# Patient Record
Sex: Male | Born: 1985 | Race: Black or African American | Hispanic: No | Marital: Single | Smoking: Former smoker
Health system: Southern US, Community
[De-identification: ages and names within clinical notes are randomized; demographics above are authoritative.]

## PROBLEM LIST (undated history)

## (undated) ENCOUNTER — Emergency Department (HOSPITAL_COMMUNITY): Admission: EM | Payer: No Typology Code available for payment source | Source: Home / Self Care

## (undated) ENCOUNTER — Ambulatory Visit (HOSPITAL_COMMUNITY): Admission: EM

## (undated) ENCOUNTER — Ambulatory Visit (HOSPITAL_COMMUNITY)

## (undated) ENCOUNTER — Emergency Department (HOSPITAL_COMMUNITY): Admission: EM | Payer: Self-pay | Source: Home / Self Care

## (undated) DIAGNOSIS — K2970 Gastritis, unspecified, without bleeding: Secondary | ICD-10-CM

## (undated) DIAGNOSIS — M549 Dorsalgia, unspecified: Secondary | ICD-10-CM

---
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
## 2007-11-17 ENCOUNTER — Emergency Department (HOSPITAL_COMMUNITY): Admission: EM | Admit: 2007-11-17 | Discharge: 2007-11-17 | Payer: Self-pay | Admitting: Family Medicine

## 2008-06-24 ENCOUNTER — Emergency Department (HOSPITAL_COMMUNITY): Admission: EM | Admit: 2008-06-24 | Discharge: 2008-06-24 | Payer: Self-pay | Admitting: Family Medicine

## 2008-08-28 ENCOUNTER — Emergency Department (HOSPITAL_COMMUNITY): Admission: EM | Admit: 2008-08-28 | Discharge: 2008-08-28 | Payer: Self-pay | Admitting: Family Medicine

## 2008-09-02 ENCOUNTER — Emergency Department (HOSPITAL_COMMUNITY): Admission: EM | Admit: 2008-09-02 | Discharge: 2008-09-02 | Payer: Self-pay | Admitting: Family Medicine

## 2008-09-11 ENCOUNTER — Emergency Department (HOSPITAL_COMMUNITY): Admission: EM | Admit: 2008-09-11 | Discharge: 2008-09-11 | Payer: Self-pay | Admitting: Family Medicine

## 2008-09-23 ENCOUNTER — Emergency Department (HOSPITAL_COMMUNITY): Admission: EM | Admit: 2008-09-23 | Discharge: 2008-09-23 | Payer: Self-pay | Admitting: Family Medicine

## 2009-09-08 ENCOUNTER — Emergency Department (HOSPITAL_COMMUNITY): Admission: EM | Admit: 2009-09-08 | Discharge: 2009-09-08 | Payer: Self-pay | Admitting: Family Medicine

## 2010-01-07 ENCOUNTER — Emergency Department (HOSPITAL_COMMUNITY)
Admission: EM | Admit: 2010-01-07 | Discharge: 2010-01-07 | Payer: Self-pay | Source: Home / Self Care | Admitting: Family Medicine

## 2010-01-16 ENCOUNTER — Emergency Department (HOSPITAL_COMMUNITY)
Admission: EM | Admit: 2010-01-16 | Discharge: 2010-01-16 | Payer: Self-pay | Source: Home / Self Care | Admitting: Emergency Medicine

## 2010-01-20 ENCOUNTER — Emergency Department (HOSPITAL_COMMUNITY): Admission: EM | Admit: 2010-01-20 | Discharge: 2010-01-20 | Payer: Self-pay | Admitting: Emergency Medicine

## 2010-01-22 ENCOUNTER — Emergency Department (HOSPITAL_COMMUNITY): Admission: EM | Admit: 2010-01-22 | Discharge: 2010-01-22 | Payer: Self-pay | Admitting: Emergency Medicine

## 2010-02-10 ENCOUNTER — Emergency Department (HOSPITAL_COMMUNITY): Admission: EM | Admit: 2010-02-10 | Discharge: 2010-02-10 | Payer: Self-pay | Admitting: Emergency Medicine

## 2010-05-30 LAB — WOUND CULTURE: Culture: NO GROWTH

## 2010-05-31 LAB — GC/CHLAMYDIA PROBE AMP, GENITAL: GC Probe Amp, Genital: NEGATIVE

## 2010-06-26 LAB — DIFFERENTIAL
Basophils Absolute: 0 10*3/uL (ref 0.0–0.1)
Basophils Relative: 0 % (ref 0–1)
Eosinophils Absolute: 0.2 10*3/uL (ref 0.0–0.7)
Monocytes Relative: 8 % (ref 3–12)
Neutro Abs: 3.5 10*3/uL (ref 1.7–7.7)
Neutrophils Relative %: 57 % (ref 43–77)

## 2010-06-26 LAB — CBC
MCHC: 33.8 g/dL (ref 30.0–36.0)
Platelets: 226 10*3/uL (ref 150–400)
RBC: 4.73 MIL/uL (ref 4.22–5.81)
RDW: 13.7 % (ref 11.5–15.5)

## 2010-06-26 LAB — POCT I-STAT, CHEM 8
Calcium, Ion: 1.17 mmol/L (ref 1.12–1.32)
Creatinine, Ser: 1 mg/dL (ref 0.4–1.5)
Hemoglobin: 16 g/dL (ref 13.0–17.0)
Sodium: 138 mEq/L (ref 135–145)
TCO2: 25 mmol/L (ref 0–100)

## 2010-06-28 LAB — POCT RAPID STREP A (OFFICE): Streptococcus, Group A Screen (Direct): NEGATIVE

## 2010-08-01 ENCOUNTER — Inpatient Hospital Stay (INDEPENDENT_AMBULATORY_CARE_PROVIDER_SITE_OTHER)
Admission: RE | Admit: 2010-08-01 | Discharge: 2010-08-01 | Disposition: A | Discharge status: Home / Self Care | Payer: 59 | Source: Ambulatory Visit | Attending: Family Medicine | Admitting: Family Medicine

## 2010-08-01 ENCOUNTER — Ambulatory Visit (INDEPENDENT_AMBULATORY_CARE_PROVIDER_SITE_OTHER): Payer: 59

## 2010-08-01 DIAGNOSIS — S139XXA Sprain of joints and ligaments of unspecified parts of neck, initial encounter: Secondary | ICD-10-CM

## 2010-08-01 DIAGNOSIS — M546 Pain in thoracic spine: Secondary | ICD-10-CM

## 2011-10-12 ENCOUNTER — Encounter (HOSPITAL_COMMUNITY): Payer: Self-pay | Admitting: Emergency Medicine

## 2011-10-12 ENCOUNTER — Emergency Department (INDEPENDENT_AMBULATORY_CARE_PROVIDER_SITE_OTHER)
Admission: EM | Admit: 2011-10-12 | Discharge: 2011-10-12 | Disposition: A | Discharge status: Home / Self Care | Payer: Self-pay | Source: Home / Self Care | Attending: Family Medicine | Admitting: Family Medicine

## 2011-10-12 DIAGNOSIS — K2990 Gastroduodenitis, unspecified, without bleeding: Secondary | ICD-10-CM

## 2011-10-12 DIAGNOSIS — K2970 Gastritis, unspecified, without bleeding: Secondary | ICD-10-CM

## 2011-10-12 MED ORDER — ONDANSETRON 4 MG PO TBDP
ORAL_TABLET | ORAL | Status: AC
  Filled 2011-10-12: qty 2

## 2011-10-12 MED ORDER — ONDANSETRON 4 MG PO TBDP
8.0000 mg | ORAL_TABLET | Freq: Once | ORAL | Status: AC
  Administered 2011-10-12: 8 mg via ORAL

## 2011-10-12 MED ORDER — GI COCKTAIL ~~LOC~~
30.0000 mL | Freq: Once | ORAL | Status: AC
  Administered 2011-10-12: 30 mL via ORAL

## 2011-10-12 MED ORDER — PROMETHAZINE HCL 12.5 MG PO TABS: 12.5000 mg | ORAL_TABLET | Freq: Four times a day (QID) | ORAL | Status: DC | PRN

## 2011-10-12 MED ORDER — OMEPRAZOLE 20 MG PO CPDR: DELAYED_RELEASE_CAPSULE | ORAL | Status: DC

## 2011-10-12 MED ORDER — SUCRALFATE 1 GM/10ML PO SUSP: 1.0000 g | Freq: Four times a day (QID) | ORAL | Status: DC

## 2011-10-12 MED ORDER — GI COCKTAIL ~~LOC~~
ORAL | Status: AC
  Filled 2011-10-12: qty 30

## 2011-10-12 NOTE — ED Provider Notes (Signed)
History     CSN: [id number]  Arrival date & time 10/12/11  1101   First MD Initiated Contact with Patient 10/12/11 1104      Chief Complaint  Patient presents with  . GI Problem    (Consider location/radiation/quality/duration/timing/severity/associated sxs/prior treatment) HPI Comments: 26 year old smoker male otherwise healthy. Here complaining of abdominal cramping, nausea and vomiting 4 times overnight. Last emesis over 3 hours ago non bilious, no hematemesis. States that it was his birthday yesterday and he was partying and drinking good amounts of alcohol (beer and vodka). Also reports eating [place] food late night. Last bowel movement yesterday evening normal no melena. Denies diarrhea. Has been able to drink fluids this morning but still nauseous. Pain is worse in the epigastric area and it is constant. Denies hematemesis.    History reviewed. No pertinent past medical history.  History reviewed. No pertinent past surgical history.  No family history on file.  History  Substance Use Topics  . Smoking status: Current Everyday Smoker  . Smokeless tobacco: Not on file  . Alcohol Use: Yes      Review of Systems  Constitutional: Negative for fever and chills.       10 systems reviewed and  pertinent negative and positive symptoms are as per HPI.     Respiratory: Negative for cough and shortness of breath.   Cardiovascular: Negative for chest pain.  Gastrointestinal: Positive for nausea, vomiting and abdominal pain. Negative for diarrhea, blood in stool and abdominal distention.  Genitourinary: Negative for dysuria.  Skin: Negative for rash.  Neurological: Negative for headaches.  All other systems reviewed and are negative.    Allergies  Review of patient's allergies indicates no known allergies.  Home Medications   Current Outpatient Rx  Name Route Sig Dispense Refill  . OMEPRAZOLE 20 MG PO CPDR  Take 1 tab by mouth twice a day for 1 week then 1 tab daily as  needed. 30 capsule 0  . PROMETHAZINE HCL 12.5 MG PO TABS Oral Take 1 tablet (12.5 mg total) by mouth every 6 (six) hours as needed for nausea. 15 tablet 0  . SUCRALFATE 1 GM/10ML PO SUSP Oral Take 10 mLs (1 g total) by mouth 4 (four) times daily. Take 30 minutes prior meals and before bedtime for one week. 420 mL 0    BP 112/67  Pulse 74  Temp 98.1 F (36.7 C) (Oral)  Resp 16  SpO2 98%  Physical Exam  Nursing note and vitals reviewed. Constitutional: He is oriented to person, place, and time. He appears well-developed and well-nourished. No distress.  HENT:  Head: Normocephalic and atraumatic.  Mouth/Throat: Oropharynx is clear and moist. No oropharyngeal exudate.  Eyes: Conjunctivae and EOM are normal. Pupils are equal, round, and reactive to light. No scleral icterus.  Neck: Normal range of motion. Neck supple. No JVD present. No thyromegaly present.  Cardiovascular: Normal rate, regular rhythm and normal heart sounds.   Pulmonary/Chest: Effort normal and breath sounds normal. No respiratory distress.  Abdominal: Soft. Bowel sounds are normal. He exhibits no distension and no mass. There is tenderness. There is no rebound and no guarding.       epigastric tenderness. No peritoneal reaction.  Lymphadenopathy:    He has no cervical adenopathy.  Neurological: He is alert and oriented to person, place, and time.  Skin: No rash noted.    ED Course  Procedures (including critical care time)  Labs Reviewed - No data to display No results found.  1. Gastritis       MDM  No clinical findings on abdominal examination suggestive of surgical abdomen. Impress symptoms related to gastritis. Treated with GI cocktail and Zofran here. Oral fluid challenge. Patient reported significantly improved symptoms prior discharge.         Sharin Grave, MD 10/13/11 [phone]

## 2011-10-12 NOTE — ED Notes (Signed)
PT TOLERATED GINGER ALE WITH N/V

## 2011-10-12 NOTE — ED Notes (Signed)
Here with x 4 episodes of vomiting and abdominal cramping since yesterday after drinking and eating late night chinese food.denies diarrhea.able to keep food/fluids down.vss

## 2011-10-23 ENCOUNTER — Emergency Department (INDEPENDENT_AMBULATORY_CARE_PROVIDER_SITE_OTHER)
Admission: EM | Admit: 2011-10-23 | Discharge: 2011-10-23 | Disposition: A | Discharge status: Home / Self Care | Payer: Self-pay | Source: Home / Self Care | Attending: Emergency Medicine | Admitting: Emergency Medicine

## 2011-10-23 ENCOUNTER — Encounter (HOSPITAL_COMMUNITY): Payer: Self-pay | Admitting: Emergency Medicine

## 2011-10-23 DIAGNOSIS — S301XXA Contusion of abdominal wall, initial encounter: Secondary | ICD-10-CM

## 2011-10-23 DIAGNOSIS — S335XXA Sprain of ligaments of lumbar spine, initial encounter: Secondary | ICD-10-CM

## 2011-10-23 DIAGNOSIS — S39012A Strain of muscle, fascia and tendon of lower back, initial encounter: Secondary | ICD-10-CM

## 2011-10-23 HISTORY — DX: Gastritis, unspecified, without bleeding: K29.70

## 2011-10-23 MED ORDER — CYCLOBENZAPRINE HCL 10 MG PO TABS: 10.0000 mg | ORAL_TABLET | Freq: Three times a day (TID) | ORAL | Status: AC | PRN

## 2011-10-23 MED ORDER — MELOXICAM 15 MG PO TABS: 15.0000 mg | ORAL_TABLET | Freq: Every day | ORAL | Status: AC

## 2011-10-23 MED ORDER — HYDROCODONE-ACETAMINOPHEN 5-325 MG PO TABS: 2.0000 | ORAL_TABLET | ORAL | Status: AC | PRN
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
## 2011-10-24 ENCOUNTER — Emergency Department (HOSPITAL_COMMUNITY)
Admission: EM | Admit: 2011-10-24 | Discharge: 2011-10-24 | Disposition: A | Discharge status: Home / Self Care | Payer: Worker's Compensation | Attending: Emergency Medicine | Admitting: Emergency Medicine

## 2011-10-24 ENCOUNTER — Emergency Department (HOSPITAL_COMMUNITY): Payer: Worker's Compensation

## 2011-10-24 ENCOUNTER — Encounter (HOSPITAL_COMMUNITY): Payer: Self-pay | Admitting: Emergency Medicine

## 2011-10-24 DIAGNOSIS — M542 Cervicalgia: Secondary | ICD-10-CM

## 2011-10-24 DIAGNOSIS — M545 Low back pain, unspecified: Secondary | ICD-10-CM

## 2011-10-24 DIAGNOSIS — R109 Unspecified abdominal pain: Secondary | ICD-10-CM | POA: Insufficient documentation

## 2011-10-24 MED ORDER — CYCLOBENZAPRINE HCL 10 MG PO TABS: 10.0000 mg | ORAL_TABLET | Freq: Two times a day (BID) | ORAL | Status: AC | PRN

## 2011-10-24 MED ORDER — HYDROCODONE-ACETAMINOPHEN 5-325 MG PO TABS: 2.0000 | ORAL_TABLET | ORAL | Status: AC | PRN

## 2011-10-24 MED ORDER — IBUPROFEN 600 MG PO TABS: 600.0000 mg | ORAL_TABLET | Freq: Four times a day (QID) | ORAL | Status: AC | PRN
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
## 2013-02-13 ENCOUNTER — Emergency Department (INDEPENDENT_AMBULATORY_CARE_PROVIDER_SITE_OTHER)
Admission: EM | Admit: 2013-02-13 | Discharge: 2013-02-13 | Disposition: A | Discharge status: Home / Self Care | Payer: PRIVATE HEALTH INSURANCE | Source: Home / Self Care | Attending: Emergency Medicine | Admitting: Emergency Medicine

## 2013-02-13 ENCOUNTER — Encounter (HOSPITAL_COMMUNITY): Payer: Self-pay | Admitting: Emergency Medicine

## 2013-02-13 DIAGNOSIS — K5289 Other specified noninfective gastroenteritis and colitis: Secondary | ICD-10-CM

## 2013-02-13 DIAGNOSIS — K529 Noninfective gastroenteritis and colitis, unspecified: Secondary | ICD-10-CM

## 2013-02-13 LAB — POCT URINALYSIS DIP (DEVICE)
Bilirubin Urine: NEGATIVE
Glucose, UA: NEGATIVE mg/dL
Hgb urine dipstick: NEGATIVE
Ketones, ur: NEGATIVE mg/dL
Leukocytes, UA: NEGATIVE
Nitrite: NEGATIVE
pH: 8.5 — ABNORMAL HIGH (ref 5.0–8.0)

## 2013-02-13 LAB — CBC WITH DIFFERENTIAL/PLATELET
Basophils Absolute: 0 10*3/uL (ref 0.0–0.1)
Lymphocytes Relative: 35 % (ref 12–46)
Lymphs Abs: 2.5 10*3/uL (ref 0.7–4.0)
Neutro Abs: 4 10*3/uL (ref 1.7–7.7)
Neutrophils Relative %: 56 % (ref 43–77)
Platelets: 254 10*3/uL (ref 150–400)
RBC: 4.76 MIL/uL (ref 4.22–5.81)
RDW: 14.6 % (ref 11.5–15.5)
WBC: 7.2 10*3/uL (ref 4.0–10.5)

## 2013-02-13 LAB — POCT I-STAT, CHEM 8
BUN: 12 mg/dL (ref 6–23)
Creatinine, Ser: 1.2 mg/dL (ref 0.50–1.35)
Potassium: 3.5 mEq/L (ref 3.5–5.1)
Sodium: 141 mEq/L (ref 135–145)
TCO2: 24 mmol/L (ref 0–100)

## 2013-02-13 MED ORDER — GI COCKTAIL ~~LOC~~
30.0000 mL | Freq: Once | ORAL | Status: AC
  Administered 2013-02-13: 30 mL via ORAL

## 2013-02-13 MED ORDER — GI COCKTAIL ~~LOC~~
ORAL | Status: AC
  Filled 2013-02-13: qty 30

## 2013-02-13 MED ORDER — ONDANSETRON 4 MG PO TBDP
8.0000 mg | ORAL_TABLET | Freq: Once | ORAL | Status: AC
  Administered 2013-02-13: 8 mg via ORAL

## 2013-02-13 MED ORDER — ONDANSETRON HCL 8 MG PO TABS: 8.0000 mg | ORAL_TABLET | Freq: Three times a day (TID) | ORAL | Status: DC | PRN

## 2013-02-13 MED ORDER — DIPHENOXYLATE-ATROPINE 2.5-0.025 MG PO TABS: 1.0000 | ORAL_TABLET | Freq: Four times a day (QID) | ORAL | Status: DC | PRN

## 2013-02-13 MED ORDER — ONDANSETRON 4 MG PO TBDP
ORAL_TABLET | ORAL | Status: AC
  Filled 2013-02-13: qty 2

## 2013-02-13 MED ORDER — DICYCLOMINE HCL 20 MG PO TABS: ORAL_TABLET | ORAL | Status: DC

## 2013-02-13 NOTE — ED Provider Notes (Signed)
Chief Complaint:   Chief Complaint  Patient presents with  . Abdominal Pain    History of Present Illness:   Manuel Silva is a 27 year old male who around 7 PM yesterday after a big Thanksgiving meal, though mid abdominal pain which would come and go and was rated 5-6/10 in intensity. It's persisted through until today. Nothing makes it better or worse. It does not radiate. He's felt nauseated and vomited 3 times yesterday and once today. No hematemesis, bilious emesis, coffee-ground emesis. He's had loose stools one today and 2 yesterday. No blood in the stool or melena. He denies fever, chills, muscle aches, or URI symptoms. He has had no sick exposures or foreign travel. He blames this on eating his grandmother's [place] which he thinks was not fully cooked.  Review of Systems:  Other than noted above, the patient denies any of the following symptoms: Systemic:  No fevers, chills, sweats, weight loss or gain, fatigue, or tiredness. ENT:  No nasal congestion, rhinorrhea, or sore throat. Lungs:  No cough, wheezing, or shortness of breath. Cardiac:  No chest pain, syncope, or presyncope. GI:  No abdominal pain, nausea, vomiting, anorexia, diarrhea, constipation, blood in stool or vomitus. GU:  No dysuria, frequency, or urgency.  PMFSH:  Past medical history, family history, social history, meds, and allergies were reviewed.  He is allergic to penicillins.  Physical Exam:   Vital signs:  BP 136/78  Pulse 50  Temp(Src) 97.3 F (36.3 C) (Oral)  Resp 16  SpO2 100% General:  Alert and oriented.  In no distress.  Skin warm and dry.  Good skin turgor, brisk capillary refill. ENT:  No scleral icterus, moist mucous membranes, no oral lesions, pharynx clear. Lungs:  Breath sounds clear and equal bilaterally.  No wheezes, rales, or rhonchi. Heart:  Rhythm regular, without extrasystoles.  No gallops or murmers. Abdomen:  Soft, flat, nondistended. No organomegaly or mass. Bowel sounds rule  out and hyperactive. There is no tenderness, guarding, or rebound. Skin: Clear, warm, and dry.  Good turgor.  Brisk capillary refill.  Labs:   Results for orders placed during the hospital encounter of 02/13/13  CBC WITH DIFFERENTIAL      Result Value Range   WBC 7.2  4.0 - 10.5 K/uL   RBC 4.76  4.22 - 5.81 MIL/uL   Hemoglobin 14.8  13.0 - 17.0 g/dL   HCT 16.1  09.6 - 04.5 %   MCV 88.0  78.0 - 100.0 fL   MCH 31.1  26.0 - 34.0 pg   MCHC 35.3  30.0 - 36.0 g/dL   RDW 40.9  81.1 - 91.4 %   Platelets 254  150 - 400 K/uL   Neutrophils Relative % 56  43 - 77 %   Neutro Abs 4.0  1.7 - 7.7 K/uL   Lymphocytes Relative 35  12 - 46 %   Lymphs Abs 2.5  0.7 - 4.0 K/uL   Monocytes Relative 7  3 - 12 %   Monocytes Absolute 0.5  0.1 - 1.0 K/uL   Eosinophils Relative 2  0 - 5 %   Eosinophils Absolute 0.2  0.0 - 0.7 K/uL   Basophils Relative 0  0 - 1 %   Basophils Absolute 0.0  0.0 - 0.1 K/uL  POCT I-STAT, CHEM 8      Result Value Range   Sodium 141  135 - 145 mEq/L   Potassium 3.5  3.5 - 5.1 mEq/L   Chloride 103  96 -  112 mEq/L   BUN 12  6 - 23 mg/dL   Creatinine, Ser 1.61  0.50 - 1.35 mg/dL   Glucose, Bld 096 (*) 70 - 99 mg/dL   Calcium, Ion 0.45 (*) 1.12 - 1.23 mmol/L   TCO2 24  0 - 100 mmol/L   Hemoglobin 16.0  13.0 - 17.0 g/dL   HCT 40.9  81.1 - 91.4 %  POCT URINALYSIS DIP (DEVICE)      Result Value Range   Glucose, UA NEGATIVE  NEGATIVE mg/dL   Bilirubin Urine NEGATIVE  NEGATIVE   Ketones, ur NEGATIVE  NEGATIVE mg/dL   Specific Gravity, Urine 1.020  1.005 - 1.030   Hgb urine dipstick NEGATIVE  NEGATIVE   pH 8.5 (*) 5.0 - 8.0   Protein, ur NEGATIVE  NEGATIVE mg/dL   Urobilinogen, UA 0.2  0.0 - 1.0 mg/dL   Nitrite NEGATIVE  NEGATIVE   Leukocytes, UA NEGATIVE  NEGATIVE     Course in Urgent Care Center:   He was given 30 cc of GI cocktail and Zofran ODT 8 mg sublingually with good relief of his symptoms.  Assessment:  The encounter diagnosis was Gastroenteritis.  Plan:   1.   Meds:  The following meds were prescribed:   New Prescriptions   DICYCLOMINE (BENTYL) 20 MG TABLET    1 every 8 hours as needed for abdominal pain.   DIPHENOXYLATE-ATROPINE (LOMOTIL) 2.5-0.025 MG PER TABLET    Take 1 tablet by mouth 4 (four) times daily as needed for diarrhea or loose stools.   ONDANSETRON (ZOFRAN) 8 MG TABLET    Take 1 tablet (8 mg total) by mouth every 8 (eight) hours as needed for nausea or vomiting.    2.  Patient Education/Counseling:  The patient was given appropriate handouts, self care instructions, and instructed in symptomatic relief. The patient was told to stay on clear liquids for the remainder of the day, then advance to a B.R.A.T. diet starting tomorrow.   3.  Follow up:  The patient was told to follow up if no better in 3 to 4 days, if becoming worse in any way, and given some red flag symptoms such as persistent vomiting, fever, worsening abdominal pain, or any signs of GI bleeding which would prompt immediate return.  Follow up here if necessary.       Reuben Likes, MD 02/13/13 1021

## 2013-02-13 NOTE — ED Notes (Signed)
C/o pain umbilical area since yesterday w n/v/d; pain comes and goes, some worse w ride over here from home, able to walk upright w/o difficulty; NAD

## 2013-04-22 ENCOUNTER — Encounter (HOSPITAL_COMMUNITY): Payer: Self-pay | Admitting: Emergency Medicine

## 2013-04-22 ENCOUNTER — Emergency Department (INDEPENDENT_AMBULATORY_CARE_PROVIDER_SITE_OTHER)
Admission: EM | Admit: 2013-04-22 | Discharge: 2013-04-22 | Disposition: A | Discharge status: Home / Self Care | Payer: Worker's Compensation | Source: Home / Self Care | Attending: Family Medicine | Admitting: Family Medicine

## 2013-04-22 ENCOUNTER — Emergency Department (INDEPENDENT_AMBULATORY_CARE_PROVIDER_SITE_OTHER): Payer: Worker's Compensation

## 2013-04-22 DIAGNOSIS — S93409A Sprain of unspecified ligament of unspecified ankle, initial encounter: Secondary | ICD-10-CM

## 2013-04-22 DIAGNOSIS — S93402A Sprain of unspecified ligament of left ankle, initial encounter: Secondary | ICD-10-CM

## 2013-04-22 NOTE — ED Provider Notes (Signed)
CSN: [id number]     Arrival date & time 04/22/13  1858 History   First MD Initiated Contact with Patient 04/22/13 1925     Chief Complaint  Patient presents with  . Ankle Injury   (Consider location/radiation/quality/duration/timing/severity/associated sxs/prior Treatment) Patient is a 28 y.o. male presenting with ankle pain. The history is provided by the patient.  Ankle Pain Location:  Ankle Time since incident:  3 hours Injury: yes   Mechanism of injury: fall   Fall:    Fall occurred: twisted falling over hand cart at work.   Entrapped after fall: no   Ankle location:  L ankle Pain details:    Quality:  Throbbing   Radiates to:  Does not radiate Chronicity:  New Dislocation: no   Prior injury to area:  No Associated symptoms: decreased ROM   Associated symptoms: no back pain and no numbness     Past Medical History  Diagnosis Date  . Gastritis    History reviewed. No pertinent past surgical history. No family history on file. History  Substance Use Topics  . Smoking status: Current Some Day Smoker    Types: Cigarettes  . Smokeless tobacco: Not on file  . Alcohol Use: Yes     Comment: occasion    Review of Systems  Constitutional: Negative.   Musculoskeletal: Positive for gait problem and joint swelling. Negative for back pain.  Skin: Negative.     Allergies  Penicillins  Home Medications   Current Outpatient Rx  Name  Route  Sig  Dispense  Refill  . dicyclomine (BENTYL) 20 MG tablet      1 every 8 hours as needed for abdominal pain.   20 tablet   0   . diphenoxylate-atropine (LOMOTIL) 2.5-0.025 MG per tablet   Oral   Take 1 tablet by mouth 4 (four) times daily as needed for diarrhea or loose stools.   16 tablet   0   . ondansetron (ZOFRAN) 8 MG tablet   Oral   Take 1 tablet (8 mg total) by mouth every 8 (eight) hours as needed for nausea or vomiting.   20 tablet   0    BP 131/73  Pulse 56  Temp(Src) 97.1 F (36.2 C) (Oral)  Resp 17   SpO2 98% Physical Exam  Nursing note and vitals reviewed. Constitutional: He is oriented to person, place, and time. He appears well-developed and well-nourished.  Musculoskeletal: He exhibits tenderness.       Left ankle: He exhibits decreased range of motion and swelling. He exhibits no deformity. Tenderness. Lateral malleolus and AITFL tenderness found. No medial malleolus, no head of 5th metatarsal and no proximal fibula tenderness found. Achilles tendon normal.  Neurological: He is alert and oriented to person, place, and time.  Skin: Skin is warm and dry.    ED Course  Procedures (including critical care time) Labs Review Labs Reviewed - No data to display Imaging Review Dg Ankle Complete Left  04/22/2013   CLINICAL DATA:  Pain post trauma  EXAM: LEFT ANKLE COMPLETE - 3+ VIEW  COMPARISON:  None.  FINDINGS: Frontal, oblique, and lateral views were obtained. 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 No acute fracture is seen in this region. Elsewhere, there is no evidence of acute fracture or joint effusion. Ankle mortise appears intact.  IMPRESSION: Old fracture of the medial malleolus with displaced fragment. No acute fracture apparent. Mortise intact.   Electronically Signed   By: Chrissie NoaWilliam  Margarita Grizzle M.D.   On: 04/22/2013 19:53      MDM  X-rays reviewed and report per radiologist.     Linna Hoff, MD 04/22/13 2001

## 2013-04-22 NOTE — Discharge Instructions (Signed)
Wear ankle support as needed for comfort, activity as tolerated. advil and ice as needed, return or see orthopedist if further problems. °

## 2013-04-22 NOTE — ED Notes (Addendum)
Pt reports left ankle inj onset 1200 today while at work Reports he twisted ankle outward and foot inward Reports he felt/heard a "pop" sxs include: swelling, pain Brought back in a wheel chair Alert w/no signs of acute distress.

## 2013-09-24 ENCOUNTER — Emergency Department (INDEPENDENT_AMBULATORY_CARE_PROVIDER_SITE_OTHER)
Admission: EM | Admit: 2013-09-24 | Discharge: 2013-09-24 | Disposition: A | Discharge status: Home / Self Care | Payer: PRIVATE HEALTH INSURANCE | Source: Home / Self Care | Attending: Emergency Medicine | Admitting: Emergency Medicine

## 2013-09-24 ENCOUNTER — Encounter (HOSPITAL_COMMUNITY): Payer: Self-pay | Admitting: Emergency Medicine

## 2013-09-24 ENCOUNTER — Emergency Department (HOSPITAL_COMMUNITY): Payer: PRIVATE HEALTH INSURANCE

## 2013-09-24 DIAGNOSIS — R0602 Shortness of breath: Secondary | ICD-10-CM

## 2013-09-24 NOTE — ED Provider Notes (Signed)
CSN: [id number]     Arrival date & time 09/24/13  0906 History   First MD Initiated Contact with Patient 09/24/13 1002     Chief Complaint  Patient presents with  . Chest Pain   (Consider location/radiation/quality/duration/timing/severity/associated sxs/prior Treatment) HPI Comments: 28 year old male presents for evaluation of chest pain. Since last night he has had substernal chest pain. This comes and goes. It is usually worse with a deep breath. It is also sometimes worse with exertion. He also notes that he recently started smoking. The pain does not radiate. He denies any associated nausea, vomiting. No swelling or pain in the legs. No recent travel. No personal or family history of DVT or PE. No family history of heart disease at young age.   Past Medical History  Diagnosis Date  . Gastritis    History reviewed. No pertinent past surgical history. History reviewed. No pertinent family history. History  Substance Use Topics  . Smoking status: Current Some Day Smoker    Types: Cigarettes  . Smokeless tobacco: Not on file  . Alcohol Use: Yes     Comment: occasion    Review of Systems  Respiratory: Positive for shortness of breath.   Cardiovascular: Positive for chest pain.  All other systems reviewed and are negative.   Allergies  Penicillins  Home Medications   Prior to Admission medications   Medication Sig Start Date End Date Taking? Authorizing Provider  dicyclomine (BENTYL) 20 MG tablet 1 every 8 hours as needed for abdominal pain. 02/13/13   Reuben Likesavid C Keller, MD  diphenoxylate-atropine (LOMOTIL) 2.5-0.025 MG per tablet Take 1 tablet by mouth 4 (four) times daily as needed for diarrhea or loose stools. 02/13/13   Reuben Likesavid C Keller, MD  ondansetron (ZOFRAN) 8 MG tablet Take 1 tablet (8 mg total) by mouth every 8 (eight) hours as needed for nausea or vomiting. 02/13/13   Reuben Likesavid C Keller, MD   BP 123/64  Pulse 60  Temp(Src) 98.3 F (36.8 C) (Oral)  Resp 18  SpO2  98% Physical Exam  Nursing note and vitals reviewed. Constitutional: He is oriented to person, place, and time. He appears well-developed and well-nourished. No distress.  HENT:  Head: Normocephalic and atraumatic.  Right Ear: External ear normal.  Eyes: Conjunctivae are normal.  Neck: Normal range of motion. Neck supple. No JVD present.  Cardiovascular: Normal rate, regular rhythm, normal heart sounds and intact distal pulses.  Exam reveals no gallop and no friction rub.   No murmur heard. Pulmonary/Chest: Effort normal and breath sounds normal. No respiratory distress. He has no wheezes. He has no rales.  Neurological: He is alert and oriented to person, place, and time. Coordination normal.  Skin: Skin is warm and dry. No rash noted. He is not diaphoretic.  Psychiatric: He has a normal mood and affect. Judgment normal.    ED Course  Procedures (including critical care time) Labs Review Labs Reviewed - No data to display  Imaging Review No results found.   MDM   1. Shortness of breath    After discussing this further, the patient further explains that he is currently asymptomatic and has been for the past few hours, he just felt very slightly short winded when he woke up. He is now denying that he ever had any chest pain. He'll go to the ED if these symptoms return. Quit smoking. He'll followup as needed    Graylon GoodZachary H Alka Falwell, PA-C 09/24/13 1050

## 2013-09-24 NOTE — ED Notes (Signed)
Pt  Reports  Symptoms  Of    Chest  Pain        Since  Last  Pm   -  Hurts  When  He  Takes  A  Deep  Breath  -  Pt  Is  A  Smoker         He  Is  Awake  And  Alert  And  Oriented        Skin is  Warm and  Dry

## 2013-09-24 NOTE — Discharge Instructions (Signed)

## 2013-09-24 NOTE — ED Provider Notes (Signed)
Medical screening examination/treatment/procedure(s) were performed by non-physician practitioner and as supervising physician I was immediately available for consultation/collaboration.  Orvan Papadakis, M.D.  Cherylann Hobday C Kaleb Linquist, MD 09/24/13 1114 

## 2013-11-17 ENCOUNTER — Emergency Department (HOSPITAL_COMMUNITY)
Admission: EM | Admit: 2013-11-17 | Discharge: 2013-11-17 | Disposition: A | Discharge status: Home / Self Care | Payer: PRIVATE HEALTH INSURANCE | Attending: Emergency Medicine | Admitting: Emergency Medicine

## 2013-11-17 ENCOUNTER — Encounter (HOSPITAL_COMMUNITY): Payer: Self-pay | Admitting: Emergency Medicine

## 2013-11-17 ENCOUNTER — Emergency Department (HOSPITAL_COMMUNITY): Payer: PRIVATE HEALTH INSURANCE

## 2013-11-17 DIAGNOSIS — Z880 Allergy status to penicillin: Secondary | ICD-10-CM | POA: Insufficient documentation

## 2013-11-17 DIAGNOSIS — R51 Headache: Secondary | ICD-10-CM | POA: Insufficient documentation

## 2013-11-17 DIAGNOSIS — J3489 Other specified disorders of nose and nasal sinuses: Secondary | ICD-10-CM | POA: Diagnosis present

## 2013-11-17 DIAGNOSIS — J069 Acute upper respiratory infection, unspecified: Secondary | ICD-10-CM

## 2013-11-17 DIAGNOSIS — IMO0002 Reserved for concepts with insufficient information to code with codable children: Secondary | ICD-10-CM | POA: Diagnosis not present

## 2013-11-17 DIAGNOSIS — F17200 Nicotine dependence, unspecified, uncomplicated: Secondary | ICD-10-CM | POA: Insufficient documentation

## 2013-11-17 DIAGNOSIS — Z8719 Personal history of other diseases of the digestive system: Secondary | ICD-10-CM | POA: Diagnosis not present

## 2013-11-17 MED ORDER — FLUTICASONE PROPIONATE 50 MCG/ACT NA SUSP: 2.0000 | Freq: Every day | NASAL | Status: DC

## 2013-11-17 NOTE — ED Notes (Signed)
Patient states started having sinus pressure and nasal congestion yesterday.  Slight sore throat with productive cough.

## 2013-11-17 NOTE — ED Provider Notes (Signed)
CSN: [id number]     Arrival date & time 11/17/13  0826 History  This chart was scribed for non-physician practitioner, Allen Derry, PA-C working with Mirian Mo, MD by Greggory Stallion, ED scribe. This patient was seen in room TR07C/TR07C and the patient's care was started at 9:16 AM.    Chief Complaint  Patient presents with  . Nasal Congestion  . Cough   Patient is a 28 y.o. male presenting with cough. The history is provided by the patient. No language interpreter was used.  Cough Cough characteristics:  Productive Sputum characteristics:  Yellow Severity:  Mild Duration:  1 day Timing:  Intermittent Progression:  Unchanged Chronicity:  New Smoker: yes   Relieved by:  Nothing Worsened by:  Nothing tried Ineffective treatments: benadryl. Associated symptoms: headaches, rhinorrhea and sore throat   Associated symptoms: no chest pain, no ear pain and no shortness of breath   Rhinorrhea:    Severity:  Moderate   Duration:  1 day   Timing:  Intermittent   Progression:  Unchanged  HPI Comments: CEDERIC MOZLEY is a 28 y.o. male who presents to the Emergency Department complaining of productive cough of yellow sputum, sinus pressure, nasal congestion and rhinorrhea that started yesterday. Reports associated mild sore throat, headache and eye watering. Pt has taken benadryl with no relief. States symptoms are not worsened by anything. Denies ear pain, chest pain, SOB, abdominal pain, nausea, emesis. Denies sick contacts. Denies history of environmental allergies or asthma. Pt smokes about one pack of cigarettes every 3 weeks.   Past Medical History  Diagnosis Date  . Gastritis    History reviewed. No pertinent past surgical history. No family history on file. History  Substance Use Topics  . Smoking status: Current Some Day Smoker    Types: Cigarettes  . Smokeless tobacco: Not on file  . Alcohol Use: Yes     Comment: occasion    Review of Systems  HENT:  Positive for congestion, rhinorrhea, sinus pressure and sore throat. Negative for ear pain.   Respiratory: Positive for cough. Negative for shortness of breath.   Cardiovascular: Negative for chest pain.  Gastrointestinal: Negative for nausea, vomiting and abdominal pain.  Neurological: Positive for headaches.  10 Systems reviewed and are negative for acute change except as noted in the HPI.  Allergies  Penicillins  Home Medications   Prior to Admission medications   Medication Sig Start Date End Date Taking? Authorizing Provider  diphenhydrAMINE (BENADRYL) 25 MG tablet Take 50 mg by mouth every 6 (six) hours as needed for allergies.   Yes Historical Provider, MD  fluticasone (FLONASE) 50 MCG/ACT nasal spray Place 2 sprays into both nostrils daily. 11/17/13   Zach Tietje Strupp Camprubi-Soms, PA-C   BP 137/76  Pulse 77  Temp(Src) 98.1 F (36.7 C) (Oral)  Ht  (1.753 m)  Wt 195 lb (88.451 kg)  BMI 28.78 kg/m2  SpO2 99%  Physical Exam  Nursing note and vitals reviewed. Constitutional: He is oriented to person, place, and time. Vital signs are normal. He appears well-developed and well-nourished. No distress.  Afebrile, nontoxic  HENT:  Head: Normocephalic and atraumatic.  Right Ear: Tympanic membrane, external ear and ear canal normal. Tympanic membrane is not injected. No middle ear effusion.  Left Ear: Tympanic membrane, external ear and ear canal normal. Tympanic membrane is not injected.  No middle ear effusion.  Nose: Mucosal edema and rhinorrhea present.  Mouth/Throat: Uvula is midline, oropharynx is clear and moist and mucous membranes  are normal. No trismus in the jaw. No uvula swelling.  Ears clear bilaterally with no injection or erythema. Bilateral nasal turbinates edematous and erythematous with mild rhinorrhea noted. Moist membranes, uvula midline with no swelling, no trismus, no paratonsillar abscess. Oropharynx is clear and moist without exudates edema or erythema.   Eyes: Conjunctivae and EOM are normal.  Neck: Normal range of motion. Neck supple.  Cardiovascular: Normal rate, regular rhythm and normal heart sounds.  Exam reveals no gallop and no friction rub.   No murmur heard. Pulmonary/Chest: Effort normal and breath sounds normal. No respiratory distress. He has no wheezes. He has no rhonchi. He has no rales.  Clear to auscultation in all lung fields. No wheezes rhonchi or rales.  Abdominal: Normal appearance. He exhibits no distension.  Musculoskeletal: Normal range of motion.  Lymphadenopathy:    He has no cervical adenopathy.  No tender cervical lymphadenopathy.  Neurological: He is alert and oriented to person, place, and time.  Skin: Skin is warm, dry and intact.  Psychiatric: He has a normal mood and affect. His behavior is normal.    ED Course  Procedures (including critical care time)  DIAGNOSTIC STUDIES: Oxygen Saturation is 99% on RA, normal by my interpretation.    COORDINATION OF CARE: 9:21 AM-Discussed treatment plan which includes Flonase, chloraseptic spray, tylenol and ibuprofen with pt at bedside and pt agreed to plan. Return precautions given.   Labs Review Labs Reviewed - No data to display  Imaging Review Dg Chest 2 View  11/17/2013   CLINICAL DATA:  Nasal congestion and cough  EXAM: CHEST  2 VIEW  COMPARISON:  01/16/2010  FINDINGS: Normal heart size. Chronic blunting of the right cardiophrenic sulcus since 2011, often related to a pericardial cyst or prominent fat pad. There is no edema, consolidation, effusion, or pneumothorax.  IMPRESSION: No active cardiopulmonary disease.   Electronically Signed   By: Tiburcio Pea M.D.   On: 11/17/2013 09:04     EKG Interpretation None      MDM   Final diagnoses:  URI (upper respiratory infection)    28y/o male with URI symptoms. Pt is afebrile with clear lung exam. Mild rhinorrhea. Likely viral URI. CXR clear.  Pt is agreeable to symptomatic treatment with close  follow up with PCP as needed but spoke at length about emergent changing or worsening of symptoms that should prompt return to ER. Pt voices understanding and is agreeable to plan. rx given for flonase, discussed chloraseptic spray and tylenol/motrin. I explained the diagnosis and have given explicit precautions to return to the ER including for any other new or worsening symptoms. The patient understands and accepts the medical plan as it's been dictated and I have answered their questions. Discharge instructions concerning home care and prescriptions have been given. The patient is STABLE and is discharged to home in good condition.   I personally performed the services described in this documentation, which was scribed in my presence. The recorded information has been reviewed and is accurate.  BP 137/76  Pulse 77  Temp(Src) 98.1 F (36.7 C) (Oral)  Ht  (1.753 m)  Wt 195 lb (88.451 kg)  BMI 28.78 kg/m2  SpO2 99%  Meds ordered this encounter  Medications  . fluticasone (FLONASE) 50 MCG/ACT nasal spray    Sig: Place 2 sprays into both nostrils daily.    Dispense:  16 g    Refill:  0    Order Specific Question:  Supervising Provider  Answer:  Vida Roller [id number] Thresa Dozier [place], [place] 11/17/13 [phone]

## 2013-11-17 NOTE — Discharge Instructions (Signed)
Continue to stay well-hydrated. Gargle warm salt water and spit it out. Continue to alternate between Tylenol and Ibuprofen for pain or fever. May consider over-the-counter Benadryl for additional relief. Followup with your primary care doctor in 5-7 days for recheck of ongoing symptoms. Return to emergency department for emergent changing or worsening of symptoms. ° °Take benadryl or other antihistamine to decrease secretions and for watery itchy eyes. Use flonase as directed to help with nasal congestion. ° ° °Upper Respiratory Infection, Adult °An upper respiratory infection (URI) is also sometimes known as the common cold. The upper respiratory tract includes the nose, sinuses, throat, trachea, and bronchi. Bronchi are the airways leading to the lungs. Most people improve within 1 week, but symptoms can last up to 2 weeks. A residual cough may last even longer.  °CAUSES °Many different viruses can infect the tissues lining the upper respiratory tract. The tissues become irritated and inflamed and often become very moist. Mucus production is also common. A cold is contagious. You can easily spread the virus to others by oral contact. This includes kissing, sharing a glass, coughing, or sneezing. Touching your mouth or nose and then touching a surface, which is then touched by another person, can also spread the virus. °SYMPTOMS  °Symptoms typically develop 1 to 3 days after you come in contact with a cold virus. Symptoms vary from person to person. They may include: °· Runny nose. °· Sneezing. °· Nasal congestion. °· Sinus irritation. °· Sore throat. °· Loss of voice (laryngitis). °· Cough. °· Fatigue. °· Muscle aches. °· Loss of appetite. °· Headache. °· Low-grade fever. °DIAGNOSIS  °You might diagnose your own cold based on familiar symptoms, since most people get a cold 2 to 3 times a year. Your caregiver can confirm this based on your exam. Most importantly, your caregiver can check that your symptoms are not  due to another disease such as strep throat, sinusitis, pneumonia, asthma, or epiglottitis. Blood tests, throat tests, and X-rays are not necessary to diagnose a common cold, but they may sometimes be helpful in excluding other more serious diseases. Your caregiver will decide if any further tests are required. °RISKS AND COMPLICATIONS  °You may be at risk for a more severe case of the common cold if you smoke cigarettes, have chronic heart disease (such as heart failure) or lung disease (such as asthma), or if you have a weakened immune system. The very young and very old are also at risk for more serious infections. Bacterial sinusitis, middle ear infections, and bacterial pneumonia can complicate the common cold. The common cold can worsen asthma and chronic obstructive pulmonary disease (COPD). Sometimes, these complications can require emergency medical care and may be life-threatening. °PREVENTION  °The best way to protect against getting a cold is to practice good hygiene. Avoid oral or hand contact with people with cold symptoms. Wash your hands often if contact occurs. There is no clear evidence that vitamin C, vitamin E, echinacea, or exercise reduces the chance of developing a cold. However, it is always recommended to get plenty of rest and practice good nutrition. °TREATMENT  °Treatment is directed at relieving symptoms. There is no cure. Antibiotics are not effective, because the infection is caused by a virus, not by bacteria. Treatment may include: °· Increased fluid intake. Sports drinks offer valuable electrolytes, sugars, and fluids. °· Breathing heated mist or steam (vaporizer or shower). °· Eating chicken soup or other clear broths, and maintaining good nutrition. °· Getting plenty of rest. °·   Using gargles or lozenges for comfort. °· Controlling fevers with ibuprofen or acetaminophen as directed by your caregiver. °· Increasing usage of your inhaler if you have asthma. °Zinc gel and zinc  lozenges, taken in the first 24 hours of the common cold, can shorten the duration and lessen the severity of symptoms. Pain medicines may help with fever, muscle aches, and throat pain. A variety of non-prescription medicines are available to treat congestion and runny nose. Your caregiver can make recommendations and may suggest nasal or lung inhalers for other symptoms.  °HOME CARE INSTRUCTIONS  °· Only take over-the-counter or prescription medicines for pain, discomfort, or fever as directed by your caregiver. °· Use a warm mist humidifier or inhale steam from a shower to increase air moisture. This may keep secretions moist and make it easier to breathe. °· Drink enough water and fluids to keep your urine clear or pale yellow. °· Rest as needed. °· Return to work when your temperature has returned to normal or as your caregiver advises. You may need to stay home longer to avoid infecting others. You can also use a face mask and careful hand washing to prevent spread of the virus. °SEEK MEDICAL CARE IF:  °· After the first few days, you feel you are getting worse rather than better. °· You need your caregiver's advice about medicines to control symptoms. °· You develop chills, worsening shortness of breath, or brown or red sputum. These may be signs of pneumonia. °· You develop yellow or brown nasal discharge or pain in the face, especially when you bend forward. These may be signs of sinusitis. °· You develop a fever, swollen neck glands, pain with swallowing, or white areas in the back of your throat. These may be signs of strep throat. °SEEK IMMEDIATE MEDICAL CARE IF:  °· You have a fever. °· You develop severe or persistent headache, ear pain, sinus pain, or chest pain. °· You develop wheezing, a prolonged cough, cough up blood, or have a change in your usual mucus (if you have chronic lung disease). °· You develop sore muscles or a stiff neck. °Document Released: 08/29/2000 Document Revised: 05/28/2011  Document Reviewed: 06/10/2013 °[employer]® Patient Information ©2015 [employer]. This information is not intended to replace advice given to you by your health care provider. Make sure you discuss any questions you have with your health care provider. ° ° °

## 2013-11-20 NOTE — ED Provider Notes (Signed)
Medical screening examination/treatment/procedure(s) were performed by non-physician practitioner and as supervising physician I was immediately available for consultation/collaboration.   EKG Interpretation None        Mirian Mo, MD 11/20/13 1501

## 2014-03-31 ENCOUNTER — Encounter (HOSPITAL_COMMUNITY): Payer: Self-pay

## 2014-03-31 ENCOUNTER — Emergency Department (HOSPITAL_COMMUNITY)
Admission: EM | Admit: 2014-03-31 | Discharge: 2014-03-31 | Disposition: A | Discharge status: Home / Self Care | Payer: Managed Care, Other (non HMO) | Attending: Emergency Medicine | Admitting: Emergency Medicine

## 2014-03-31 DIAGNOSIS — Z8719 Personal history of other diseases of the digestive system: Secondary | ICD-10-CM | POA: Diagnosis not present

## 2014-03-31 DIAGNOSIS — Z720 Tobacco use: Secondary | ICD-10-CM | POA: Insufficient documentation

## 2014-03-31 DIAGNOSIS — Z880 Allergy status to penicillin: Secondary | ICD-10-CM | POA: Diagnosis not present

## 2014-03-31 DIAGNOSIS — J029 Acute pharyngitis, unspecified: Secondary | ICD-10-CM | POA: Diagnosis present

## 2014-03-31 DIAGNOSIS — R110 Nausea: Secondary | ICD-10-CM

## 2014-03-31 DIAGNOSIS — R05 Cough: Secondary | ICD-10-CM

## 2014-03-31 DIAGNOSIS — R059 Cough, unspecified: Secondary | ICD-10-CM

## 2014-03-31 LAB — RAPID STREP SCREEN (MED CTR MEBANE ONLY): Streptococcus, Group A Screen (Direct): NEGATIVE

## 2014-03-31 MED ORDER — ONDANSETRON 4 MG PO TBDP: 4.0000 mg | ORAL_TABLET | Freq: Three times a day (TID) | ORAL | Status: DC | PRN

## 2014-03-31 MED ORDER — ONDANSETRON 4 MG PO TBDP
4.0000 mg | ORAL_TABLET | Freq: Once | ORAL | Status: AC
  Administered 2014-03-31: 4 mg via ORAL
  Filled 2014-03-31: qty 1

## 2014-03-31 MED ORDER — DEXTROMETHORPHAN POLISTIREX 30 MG/5ML PO LQCR: 30.0000 mg | ORAL | Status: DC | PRN

## 2014-03-31 NOTE — ED Provider Notes (Signed)
CSN: [id number]     Arrival date & time 03/31/14  0901 History   First MD Initiated Contact with Patient 03/31/14 [phone]     Chief Complaint  Patient presents with  . Sore Throat     (Consider location/radiation/quality/duration/timing/severity/associated sxs/prior Treatment) Patient is a 29 y.o. male presenting with pharyngitis. The history is provided by the patient and medical records.  Sore Throat Associated symptoms include congestion, coughing and a sore throat.    This is a 29 y.o. F with PMH significant for gastritis, presenting to the ED for sore throat, nasal congestion, and dry cough, onset last night.  Patient denies fever, chills, sweats.  No known sick contacts.  No chest pain or SOB.  States he does have some nausea which he thinks is due to the post-nasal drip.  No abdominal pain, vomiting, or diarrhea.  No intervention tried PTA.  VS stable on arrival.  Past Medical History  Diagnosis Date  . Gastritis    History reviewed. No pertinent past surgical history. History reviewed. No pertinent family history. History  Substance Use Topics  . Smoking status: Current Some Day Smoker    Types: Cigarettes  . Smokeless tobacco: Not on file  . Alcohol Use: Yes     Comment: occasion    Review of Systems  HENT: Positive for congestion, postnasal drip and sore throat.   Respiratory: Positive for cough.   All other systems reviewed and are negative.     Allergies  Penicillins  Home Medications   Prior to Admission medications   Medication Sig Start Date End Date Taking? Authorizing Provider  fluticasone (FLONASE) 50 MCG/ACT nasal spray Place 2 sprays into both nostrils daily. Patient not taking: Reported on 03/31/2014 11/17/13   Donnita [place] Camprubi-Soms, PA-C   BP 119/67 mmHg  Pulse 56  Temp(Src) 97.6 F (36.4 C) (Oral)  Resp 16  Ht  (1.753 m)  SpO2 100%   Physical Exam  Constitutional: He is oriented to person, place, and time. He appears  well-developed and well-nourished. No distress.  HENT:  Head: Normocephalic and atraumatic.  Right Ear: Tympanic membrane and ear canal normal.  Left Ear: Tympanic membrane and ear canal normal.  Nose: Nose normal.  Mouth/Throat: Uvula is midline and mucous membranes are normal. Posterior oropharyngeal erythema (mild) present.  Mild oropharyngeal erythema, PND noted; Tonsils normal in appearance bilaterally without exudate; uvula midline without peritonsillar abscess; handling secretions appropriately; no difficulty swallowing or speaking  Eyes: Conjunctivae and EOM are normal. Pupils are equal, round, and reactive to light.  Neck: Normal range of motion. Neck supple.  Cardiovascular: Normal rate, regular rhythm and normal heart sounds.   Pulmonary/Chest: Effort normal and breath sounds normal. No respiratory distress. He has no wheezes.  Abdominal: Soft. Bowel sounds are normal. There is no tenderness. There is no guarding.  Musculoskeletal: Normal range of motion.  Neurological: He is alert and oriented to person, place, and time.  Skin: Skin is warm and dry. He is not diaphoretic.  Psychiatric: He has a normal mood and affect.  Nursing note and vitals reviewed.   ED Course  Procedures (including critical care time) Labs Review Labs Reviewed  RAPID STREP SCREEN  CULTURE, GROUP A STREP    Imaging Review No results found.   EKG Interpretation None      MDM   Final diagnoses:  Sore throat  Cough  Nausea   29 year old male with URI symptoms ongoing for <24 hours.  On exam, patient afebrile and  nontoxic in appearance. His exam is overall benign and noninfectious. Abdominal exam benign.  Rapid strep negative, culture pending. Lungs are clear bilaterally without wheezes or rhonchi to suggest pneumonia. Suspect symptoms are viral in nature. Patient will be discharged home with symptomatic care.  Discussed plan with patient, he/she acknowledged understanding and agreed with plan  of care.  Return precautions given for new or worsening symptoms.  Garlon HatchetLisa M Sanders, PA-C 03/31/14 1042  Benny LennertJoseph L Zammit, MD 03/31/14 [phone]

## 2014-03-31 NOTE — ED Notes (Signed)
Pt complains of sore throat and cough since last night.  

## 2014-03-31 NOTE — Discharge Instructions (Signed)
Take the prescribed medication as directed.  Drink fluids to stay hydrated. Return to the ED for new or worsening symptoms.

## 2014-03-31 NOTE — ED Notes (Signed)
PA at bedside.

## 2014-04-02 LAB — CULTURE, GROUP A STREP

## 2014-05-01 ENCOUNTER — Emergency Department (HOSPITAL_COMMUNITY)
Admission: EM | Admit: 2014-05-01 | Discharge: 2014-05-01 | Disposition: A | Discharge status: Home / Self Care | Payer: Managed Care, Other (non HMO) | Attending: Emergency Medicine | Admitting: Emergency Medicine

## 2014-05-01 ENCOUNTER — Encounter (HOSPITAL_COMMUNITY): Payer: Self-pay | Admitting: *Deleted

## 2014-05-01 DIAGNOSIS — K2970 Gastritis, unspecified, without bleeding: Secondary | ICD-10-CM

## 2014-05-01 DIAGNOSIS — Z720 Tobacco use: Secondary | ICD-10-CM | POA: Insufficient documentation

## 2014-05-01 DIAGNOSIS — Z880 Allergy status to penicillin: Secondary | ICD-10-CM | POA: Diagnosis not present

## 2014-05-01 DIAGNOSIS — R1013 Epigastric pain: Secondary | ICD-10-CM | POA: Diagnosis present

## 2014-05-01 LAB — URINALYSIS, ROUTINE W REFLEX MICROSCOPIC
Bilirubin Urine: NEGATIVE
Glucose, UA: NEGATIVE mg/dL
Hgb urine dipstick: NEGATIVE
KETONES UR: NEGATIVE mg/dL
LEUKOCYTES UA: NEGATIVE
Nitrite: NEGATIVE
Protein, ur: NEGATIVE mg/dL
Specific Gravity, Urine: 1.023 (ref 1.005–1.030)
Urobilinogen, UA: 4 mg/dL — ABNORMAL HIGH (ref 0.0–1.0)
pH: 8 (ref 5.0–8.0)

## 2014-05-01 LAB — CBC WITH DIFFERENTIAL/PLATELET
BASOS ABS: 0 10*3/uL (ref 0.0–0.1)
Basophils Relative: 0 % (ref 0–1)
Eosinophils Absolute: 0.4 10*3/uL (ref 0.0–0.7)
Eosinophils Relative: 5 % (ref 0–5)
HCT: 44.4 % (ref 39.0–52.0)
Hemoglobin: 15.5 g/dL (ref 13.0–17.0)
Lymphocytes Relative: 39 % (ref 12–46)
Lymphs Abs: 3.1 10*3/uL (ref 0.7–4.0)
MCH: 31.3 pg (ref 26.0–34.0)
MCHC: 34.9 g/dL (ref 30.0–36.0)
MCV: 89.7 fL (ref 78.0–100.0)
MONO ABS: 0.6 10*3/uL (ref 0.1–1.0)
Monocytes Relative: 8 % (ref 3–12)
NEUTROS ABS: 3.8 10*3/uL (ref 1.7–7.7)
NEUTROS PCT: 48 % (ref 43–77)
PLATELETS: 306 10*3/uL (ref 150–400)
RBC: 4.95 MIL/uL (ref 4.22–5.81)
RDW: 13.8 % (ref 11.5–15.5)
WBC: 7.9 10*3/uL (ref 4.0–10.5)

## 2014-05-01 LAB — COMPREHENSIVE METABOLIC PANEL
ALT: 24 U/L (ref 0–53)
AST: 24 U/L (ref 0–37)
Albumin: 4.2 g/dL (ref 3.5–5.2)
Alkaline Phosphatase: 83 U/L (ref 39–117)
Anion gap: 10 (ref 5–15)
BUN: 7 mg/dL (ref 6–23)
CO2: 23 mmol/L (ref 19–32)
Calcium: 9.6 mg/dL (ref 8.4–10.5)
Chloride: 105 mmol/L (ref 96–112)
Creatinine, Ser: 1.02 mg/dL (ref 0.50–1.35)
GFR calc Af Amer: >90 mL/min (ref >90)
Glucose, Bld: 95 mg/dL (ref 70–99)
Potassium: 3.9 mmol/L (ref 3.5–5.1)
SODIUM: 138 mmol/L (ref 135–145)
Total Bilirubin: 0.5 mg/dL (ref 0.3–1.2)
Total Protein: 7.6 g/dL (ref 6.0–8.3)

## 2014-05-01 LAB — LIPASE, BLOOD: Lipase: 26 U/L (ref 11–59)

## 2014-05-01 MED ORDER — ONDANSETRON 4 MG PO TBDP: ORAL_TABLET | ORAL | Status: DC

## 2014-05-01 MED ORDER — OXYCODONE-ACETAMINOPHEN 5-325 MG PO TABS: 1.0000 | ORAL_TABLET | ORAL | Status: DC | PRN

## 2014-05-01 MED ORDER — OXYCODONE-ACETAMINOPHEN 5-325 MG PO TABS
2.0000 | ORAL_TABLET | Freq: Once | ORAL | Status: AC
  Administered 2014-05-01: 2 via ORAL
  Filled 2014-05-01: qty 2

## 2014-05-01 MED ORDER — ONDANSETRON 4 MG PO TBDP
8.0000 mg | ORAL_TABLET | Freq: Once | ORAL | Status: AC
  Administered 2014-05-01: 8 mg via ORAL
  Filled 2014-05-01: qty 2

## 2014-05-01 NOTE — ED Notes (Signed)
Abd. Pain all over since last night. Had Congochinese food last night. Vomiting alot

## 2014-05-01 NOTE — Discharge Instructions (Signed)
1. Medications: zofran, usual home medications 2. Treatment: rest, drink plenty of fluids, advance diet slowly 3. Follow Up: Please followup with your primary doctor in 2 days for discussion of your diagnoses and further evaluation after today's visit; if you do not have a primary care doctor use the resource guide provided to find one; Please return to the ER for persistent vomiting, high fevers or worsening symptoms    Gastritis, Adult Gastritis is soreness and swelling (inflammation) of the lining of the stomach. Gastritis can develop as a sudden onset (acute) or long-term (chronic) condition. If gastritis is not treated, it can lead to stomach bleeding and ulcers. CAUSES  Gastritis occurs when the stomach lining is weak or damaged. Digestive juices from the stomach then inflame the weakened stomach lining. The stomach lining may be weak or damaged due to viral or bacterial infections. One common bacterial infection is the Helicobacter pylori infection. Gastritis can also result from excessive alcohol consumption, taking certain medicines, or having too much acid in the stomach.  SYMPTOMS  In some cases, there are no symptoms. When symptoms are present, they may include:  Pain or a burning sensation in the upper abdomen.  Nausea.  Vomiting.  An uncomfortable feeling of fullness after eating. DIAGNOSIS  Your caregiver may suspect you have gastritis based on your symptoms and a physical exam. To determine the cause of your gastritis, your caregiver may perform the following:  Blood or stool tests to check for the H pylori bacterium.  Gastroscopy. A thin, flexible tube (endoscope) is passed down the esophagus and into the stomach. The endoscope has a light and camera on the end. Your caregiver uses the endoscope to view the inside of the stomach.  Taking a tissue sample (biopsy) from the stomach to examine under a microscope. TREATMENT  Depending on the cause of your gastritis, medicines  may be prescribed. If you have a bacterial infection, such as an H pylori infection, antibiotics may be given. If your gastritis is caused by too much acid in the stomach, H2 blockers or antacids may be given. Your caregiver may recommend that you stop taking aspirin, ibuprofen, or other nonsteroidal anti-inflammatory drugs (NSAIDs). HOME CARE INSTRUCTIONS  Only take over-the-counter or prescription medicines as directed by your caregiver.  If you were given antibiotic medicines, take them as directed. Finish them even if you start to feel better.  Drink enough fluids to keep your urine clear or pale yellow.  Avoid foods and drinks that make your symptoms worse, such as:  Caffeine or alcoholic drinks.  Chocolate.  Peppermint or mint flavorings.  Garlic and onions.  Spicy foods.  Citrus fruits, such as oranges, lemons, or limes.  Tomato-based foods such as sauce, chili, salsa, and pizza.  Fried and fatty foods.  Eat small, frequent meals instead of large meals. SEEK IMMEDIATE MEDICAL CARE IF:   You have black or dark red stools.  You vomit blood or material that looks like coffee grounds.  You are unable to keep fluids down.  Your abdominal pain gets worse.  You have a fever.  You do not feel better after 1 week.  You have any other questions or concerns. MAKE SURE YOU:  Understand these instructions.  Will watch your condition.  Will get help right away if you are not doing well or get worse. Document Released: 02/27/2001 Document Revised: 09/04/2011 Document Reviewed: 04/18/2011 [facility] Patient Information 2015 [place], [place]. This information is not intended to replace advice given to you by  your health care provider. Make sure you discuss any questions you have with your health care provider.    Emergency Department Resource Guide 1) Find a Doctor and Pay Out of Pocket Although you won't have to find out who is covered by your insurance plan, it is a good  idea to ask around and get recommendations. You will then need to call the office and see if the doctor you have chosen will accept you as a new patient and what types of options they offer for patients who are self-pay. Some doctors offer discounts or will set up payment plans for their patients who do not have insurance, but you will need to ask so you aren't surprised when you get to your appointment.  2) Contact Your Local Health Department Not all health departments have doctors that can see patients for sick visits, but many do, so it is worth a call to see if yours does. If you don't know where your local health department is, you can check in your phone book. The CDC also has a tool to help you locate your state's health department, and many state websites also have listings of all of their local health departments.  3) Find a Walk-in Clinic If your illness is not likely to be very severe or complicated, you may want to try a walk in clinic. These are popping up all over the country in pharmacies, drugstores, and shopping centers. They're usually staffed by nurse practitioners or physician assistants that have been trained to treat common illnesses and complaints. They're usually fairly quick and inexpensive. However, if you have serious medical issues or chronic medical problems, these are probably not your best option.  No Primary Care Doctor: - Call Health Connect at  [phone] - they can help you locate a primary care doctor that  accepts your insurance, provides certain services, etc. - Physician Referral Service- [phone]  Chronic Pain Problems: Organization         Address  Phone   Notes  [facility]  [phone] Patients need to be referred by their primary care doctor.   Medication Assistance: Organization         Address  Phone   Notes  [facility] [address] [place] [place], [place] [address] [phone]  --Must be a resident of [facility] -- Must have NO insurance coverage whatsoever (no Medicaid/ Medicare, etc.) -- The pt. MUST have a primary care doctor that directs their care regularly and follows them in the community   [facility]ist  [phone]   [employer]  [phone]    Agencies that provide inexpensive medical care: Organization         Address  Phone   Notes  [name]  [phone]   [name]    [phone]   [facility] [address] [place], [place] [address] [phone]   [facility] [address], [place] [phone]   Planned Parenthood    [phone]   [facility]    [phone]   [facility]  [address], [place] Phone:  [phone], Fax:  [phone] Hours of Operation:  9 am - 6 pm, M-F.  Also accepts Medicaid/Medicare and self-pay.  [facility]  [address], [place] Phone: [phone], Fax: [phone].  Hours of Operation:  8:30 am - 5:30 pm, M-F.  Also accepts Medicaid and self-pay.  [facility] [place] [address], [place] Phone: [phone](336) [phone]   [facility] [address], [place], [place] [phone], Ext. [phone] Mondays & Thursdays: 7-9 AM.  First 15 patients are seen on a first come, first serve basis.    Medicaid-accepting [facility] Providers:  Organization         Address  Phone   Notes  [facility] [address], [place] [phone]) [phone] Also accepts self-pay patients.  [facility] [address], [place]  [phone](336) [phone]   [facility] [address], [place] [phone]) [phone]   [facility] [address] [place] [address], [place] [phone]) [phone]   Renaye RakersVeita Bland [address], [place]   [phone]) [phone] Only  accepts [place] Access [place] patients after they have their name applied to their card.   Self-Pay (no insurance) in [facility]:  Organization         Address  Phone   Notes  Sickle Cell Patients, [facility] [address], [place] [phone](336) [phone]   [facility] [address], [place] [phone]) [phone]   [facility]  [address], [place] [phone]) [phone]   [facility]/Dr. Osei-Bonsu  [address] [place] [address], [place] or [phone], [place] [phone]) [phone] Phone number for both [place] and [place] locations is the same.  [facility] [address], [place] [phone]) [phone]   [facility] [address] [place] [address], [place] or [address] [phone]) [phone]) [phone]   [facility] [address], [place] [phone]) [phone], phone; [phone](336) [phone], fax Sees patients 1st and 3rd Saturday of every month.  Must not qualify for public or private insurance (i.e. Medicaid, Medicare, [place] Health Choice, Veterans' Benefits)  Household income should be no more than 200% of the poverty level The clinic cannot treat you if you are pregnant or think you are pregnant  Sexually transmitted diseases are not treated at the clinic.    Dental Care: Organization         Address  Phone  Notes  [facility] [address], [place] [phone]) [phone] Accepts children up to age 30 who are enrolled in [place] or [place] Health Choice; pregnant women with a Medicaid card; and children who have applied for Medicaid or [place] Health Choice, but were declined, whose parents can pay a reduced fee at time of service.  [facility] of [facility] [place]  [address], [place] [phone] Accepts children up to age 30 who are enrolled in [place] or [place] Health Choice; pregnant  women with a Medicaid card; and children who have applied for Medicaid or [place] Health Choice, but were declined, whose parents can pay a reduced fee at time of service.  [facility]  [address], [place] [phone]) [phone] Patients are seen by appointment only. Walk-ins are not accepted. [facility] will see patients 30 years of age and older. Monday - Tuesday (8am-5pm) Most Wednesdays (8:30-5pm) $30 per visit, cash only  [facility] [employer] [facility] Group[facility]ental Access PRO[facility]  [address], [facility] [phone]) [phone] Patients  are seen by appointment only. Walk-ins are not accepted. [facility] will see patients 54 years of age and older. One Wednesday Evening (Monthly: Volunteer Based).  $30 per visit, cash only  [employer]  [phone] for adults; Children under age 55, call Graduate Pediatric Dentistry at [phone]. Children aged 14-14, please call [phone] to request a pediatric application.  Dental services are provided in all areas of dental care including fillings, crowns and bridges, complete and partial dentures, implants, gum treatment, root canals, and extractions. Preventive care is also provided. Treatment is provided to both adults and children. Patients are selected via a lottery and there is often a waiting list.   [facility] [address] [place]  [phone] [email or web address]   [facility] [address] [place], [place] [phone], Ext. [phone] Second and Fourth Thursday of each month, opens at 6:30 AM; Clinic ends at 9 AM.  Patients are seen on a first-come first-served basis, and a limited number are seen during each clinic.   [facility]  [address] [place] [place], [place] [phone]   Eligibility Requirements You must have lived in [place], [place], or [place] counties for at least the last three months.   You cannot be eligible for state or federal sponsored  [place], including [employer], [place], or [employer].   You generally cannot be eligible for healthcare insurance through your employer.    How to apply: Eligibility screenings are held every Tuesday and Wednesday afternoon from 1:00 pm until 4:00 pm. You do not need an appointment for the interview!  [facility] [address], [place], [place] [phone]   [facility] Health Department  [phone]   [facility] Health Department  [phone]   [facility] Health [facility]  [phone]    Behavioral Health Resources in the Community: Intensive Outpatient Programs Organization         Address  Phone  Notes  [facility] [address] [place], [place] [phone]   [facility] [address] [place], [place] [phone]   [facility]: [facility] [address] [place], [place]  [phone]   [facility] Mental Health [address]  [place], [place] [phone] or [phone]   Substance Abuse Resources Organization         Address  Phone  Notes  Al[facility] Drug Services  [phone]   [facility]  [phone]   [facility]  [phone]   Floydene Flock  [phone]   Residential & Outpatient Substance Abuse Program  [phone]   Psychological Services Organization         Address  Phone  Notes  [facility] Behavioral Health  [phone]   [facility] Services  [phone]   [facility] Mental Health [address], [place] [phone] or [phone]    Mobile Crisis Teams Organization         Address  Phone  Notes  [facility], Mobile Crisis Care Unit  [phone]   [facility]  [address] [place], [place] [phone]   Doristine Locks [address] [place] [phone]    Self-Help/Support Groups Organization         Address  Phone              Notes  [facility] [place] - variety of support groups  336- I7437963 Call  for more information  Narcotics Anonymous (NA), [facility] [address], [employer] [place]  2 meetings at this location   Residential Sports administrator         Address  Phone  Notes  [facility] [address],    [place]  [phone]   [facility]  [address] [place] [address], [place], [place] [phone]   [facility] [address] [place], [place] [phone] Admissions: 8am-3pm M-F  [facility] [address]    [place], [place] [phone]   [facility] [address] [place], [place], [place] [phone]   [facility] [address],  [place], [place] [phone]   [facility] - Intensive Outpatient [address], [place], [place] [phone]   [facility] [address] [place]  [place], [place] [phone] or [phone]   [facility] ([facility]) [address] [place], [place] [phone] Accepts Medicaid  [facility] [address],  [place] [phone] Substance Abuse/Addiction Treatment   [facility] Organization         Address  Phone  Notes  [facility]  [phone]   Angie Fava, PhD [address] [place], [place]   [phone] or [phone]   [facility] Behavioral   [address] [place], [place] [phone]   [facility] [address] [place], [place] [phone] Insurance/Medicaid/sponsorship through [facility] and Families [address]                                    [place], [place] [phone] Therapy/tele-psych/case  [facility] [address], [place] [phone]    Dr. Lolly Mustache  [phone]   [facility]  [employer] [facility] Dept. 1) [address], [place] 2) [address] [place] 3)  [address], [place] [phone]  [phone]   [facility] Child Abuse Hotline 3371915315 or [phone] (After Hours)

## 2014-05-01 NOTE — ED Notes (Signed)
Pt tolerated PO challenge well. No nausea or vomiting.

## 2014-05-01 NOTE — ED Notes (Signed)
Hannah PA at bedside

## 2014-05-01 NOTE — ED Notes (Signed)
Pt given ginger ale and crackers and peanut butter. Will re-assess tolerance of PO challenge shortly.

## 2014-05-01 NOTE — ED Notes (Signed)
Patient refused blood labs

## 2014-05-01 NOTE — ED Notes (Signed)
Pt. Refuses blood draws r/t fear of needles. Discussed risks and benefits of not having lab. Work done. He verbalizes understanding. He will think it over.

## 2014-05-01 NOTE — ED Provider Notes (Signed)
CSN: [id number]     Arrival date & time 05/01/14  1419 History   First MD Initiated Contact with Patient 05/01/14 1524     Chief Complaint  Patient presents with  . Abdominal Pain     (Consider location/radiation/quality/duration/timing/severity/associated sxs/prior Treatment) Patient is a 29 y.o. male presenting with abdominal pain. The history is provided by the patient and medical records. No language interpreter was used.  Abdominal Pain Associated symptoms: diarrhea, nausea and vomiting   Associated symptoms: no chest pain, no constipation, no cough, no dysuria, no fatigue, no fever, no hematuria and no shortness of breath     CONLEY DELISLE is a 29 y.o. male  with no major medical problems presents to the Emergency Department complaining of intermittent nausea, vomiting and diarrhea onset last night after eating chinese around 8PM.  He reports 3 episodes of vomiting and 1 episode of diarrhea.  Pt reports generalized abd cramping with intermittent sharp epigastric pain.  No NSAID usage.  Pt reports no sick contacts.  Pt denies hematochezia, melena or hematuria.  NO treatments PTA.  Nothing makes it better or worse.  Pt denies fever, chills, headache, neck pain, chest pain, SOB, weakness, dizziness, syncope, dysuria.      Past Medical History  Diagnosis Date  . Gastritis    History reviewed. No pertinent past surgical history. History reviewed. No pertinent family history. History  Substance Use Topics  . Smoking status: Current Some Day Smoker    Types: Cigarettes  . Smokeless tobacco: Not on file  . Alcohol Use: Yes     Comment: occasion    Review of Systems  Constitutional: Negative for fever, diaphoresis, appetite change, fatigue and unexpected weight change.  HENT: Negative for mouth sores and trouble swallowing.   Respiratory: Negative for cough, chest tightness, shortness of breath, wheezing and stridor.   Cardiovascular: Negative for chest pain and  palpitations.  Gastrointestinal: Positive for nausea, vomiting, abdominal pain and diarrhea. Negative for constipation, blood in stool, abdominal distention and rectal pain.  Genitourinary: Negative for dysuria, urgency, frequency, hematuria, flank pain and difficulty urinating.  Musculoskeletal: Negative for back pain, neck pain and neck stiffness.  Skin: Negative for rash.  Neurological: Negative for weakness.  Hematological: Negative for adenopathy.  Psychiatric/Behavioral: Negative for confusion.  All other systems reviewed and are negative.     Allergies  Penicillins  Home Medications   Prior to Admission medications   Medication Sig Start Date End Date Taking? Authorizing Provider  dextromethorphan (DELSYM) 30 MG/5ML liquid Take 5 mLs (30 mg total) by mouth as needed for cough. 03/31/14   Garlon Hatchet, PA-C  fluticasone (FLONASE) 50 MCG/ACT nasal spray Place 2 sprays into both nostrils daily. Patient not taking: Reported on 03/31/2014 11/17/13   Donnita Falls Camprubi-Soms, PA-C  ondansetron (ZOFRAN ODT) 4 MG disintegrating tablet  ODT q4 hours prn nausea/vomit 05/01/14   Charitie Hinote, PA-C   BP 128/76 mmHg  Pulse 56  Temp(Src) 97.7 F (36.5 C) (Oral)  Resp 18  Ht  (1.753 m)  Wt 198 lb (89.812 kg)  BMI 29.23 kg/m2  SpO2 98% Physical Exam  Constitutional: He appears well-developed and well-nourished. No distress.  Awake, alert, nontoxic appearance  HENT:  Head: Normocephalic and atraumatic.  Mouth/Throat: Oropharynx is clear and moist. No oropharyngeal exudate.  Eyes: Conjunctivae are normal. No scleral icterus.  Neck: Normal range of motion. Neck supple.  Cardiovascular: Normal rate, regular rhythm, normal heart sounds and intact distal pulses.   No  murmur heard. Pulmonary/Chest: Effort normal and breath sounds normal. No respiratory distress. He has no wheezes.  Equal chest expansion  Abdominal: Soft. Bowel sounds are normal. He exhibits no  distension and no mass. There is no tenderness. There is no rebound and no guarding.  Musculoskeletal: Normal range of motion. He exhibits no edema.  Neurological: He is alert.  Speech is clear and goal oriented Moves extremities without ataxia  Skin: Skin is warm and dry. He is not diaphoretic. No erythema.  Psychiatric: He has a normal mood and affect.  Nursing note and vitals reviewed.   ED Course  Procedures (including critical care time) Labs Review Labs Reviewed  URINALYSIS, ROUTINE W REFLEX MICROSCOPIC - Abnormal; Notable for the following:    Urobilinogen, UA 4.0 (*)    All other components within normal limits  LIPASE, BLOOD  COMPREHENSIVE METABOLIC PANEL  CBC WITH DIFFERENTIAL/PLATELET    Imaging Review No results found.   EKG Interpretation None      MDM   Final diagnoses:  Gastritis   Si Raiderhristopher A Chou presents with abd pain, N/V/D.  His abd exam is reassuring; soft and nontender. Pt allows blood draw, but declines IV.  He has slightly dry mucous membranes, but no tachycardia.  Will give ODT zofran, PO percocet and will PO trial.  Labs pending.    5:05 PM Labs reassuring.  Pt reports resolution of pain and nausea.  He is tolerating PO without difficulty.  Patient with symptoms consistent with gastritis.  Likely viral or food induced in nature.  Vitals are stable, no fever or tachycardia.  Patient is nontoxic, nonseptic appearing, in no apparent distress.  Patient does not meet the SIRS or Sepsis criteria.  Pt's symptoms have been managed in the department.  No signs of dehydration, tolerating PO fluids > 6 oz.  Lungs are clear.  No focal abdominal pain, no peritoneal signs, no concern for appendicitis, cholecystitis, pancreatitis, ruptured viscus, UTI, kidney stone.  Supportive therapy indicated.  Patient counseled, expresses understanding and agrees with plan.  BP 128/76 mmHg  Pulse 56  Temp(Src) 97.7 F (36.5 C) (Oral)  Resp 18  Ht 5\' 9"  (1.753 m)  Wt  198 lb (89.812 kg)  BMI 29.23 kg/m2  SpO2 98%     Dierdre ForthHannah Mattingly Fountaine, PA-C 05/01/14 1709  Tilden FossaElizabeth Rees, MD 05/01/14 1739

## 2014-05-22 ENCOUNTER — Emergency Department (HOSPITAL_COMMUNITY)
Admission: EM | Admit: 2014-05-22 | Discharge: 2014-05-23 | Disposition: A | Discharge status: Home / Self Care | Payer: Managed Care, Other (non HMO) | Attending: Emergency Medicine | Admitting: Emergency Medicine

## 2014-05-22 ENCOUNTER — Encounter (HOSPITAL_COMMUNITY): Payer: Self-pay | Admitting: *Deleted

## 2014-05-22 DIAGNOSIS — R5383 Other fatigue: Secondary | ICD-10-CM | POA: Diagnosis not present

## 2014-05-22 DIAGNOSIS — J111 Influenza due to unidentified influenza virus with other respiratory manifestations: Secondary | ICD-10-CM

## 2014-05-22 DIAGNOSIS — R69 Illness, unspecified: Secondary | ICD-10-CM

## 2014-05-22 DIAGNOSIS — R05 Cough: Secondary | ICD-10-CM | POA: Diagnosis present

## 2014-05-22 DIAGNOSIS — Z720 Tobacco use: Secondary | ICD-10-CM | POA: Insufficient documentation

## 2014-05-22 DIAGNOSIS — Z880 Allergy status to penicillin: Secondary | ICD-10-CM | POA: Insufficient documentation

## 2014-05-22 DIAGNOSIS — Z7951 Long term (current) use of inhaled steroids: Secondary | ICD-10-CM | POA: Diagnosis not present

## 2014-05-22 DIAGNOSIS — Z8719 Personal history of other diseases of the digestive system: Secondary | ICD-10-CM | POA: Insufficient documentation

## 2014-05-22 DIAGNOSIS — M791 Myalgia: Secondary | ICD-10-CM | POA: Insufficient documentation

## 2014-05-22 MED ORDER — METOCLOPRAMIDE HCL 5 MG/ML IJ SOLN
10.0000 mg | Freq: Once | INTRAMUSCULAR | Status: AC
  Administered 2014-05-23: 10 mg via INTRAMUSCULAR
  Filled 2014-05-22: qty 2

## 2014-05-22 MED ORDER — KETOROLAC TROMETHAMINE 60 MG/2ML IM SOLN
60.0000 mg | Freq: Once | INTRAMUSCULAR | Status: AC
  Administered 2014-05-23: 60 mg via INTRAMUSCULAR
  Filled 2014-05-22: qty 2

## 2014-05-22 NOTE — ED Notes (Signed)
The pt has a cold c/o a headache body aches chills and feeling hot since yesterday.  No fever reducer today

## 2014-05-22 NOTE — ED Notes (Signed)
MD at the bedside at this time.

## 2014-05-22 NOTE — ED Provider Notes (Signed)
CSN: [id number]     Arrival date & time 05/22/14  1954 History   First MD Initiated Contact with Patient 05/22/14 2333     Chief Complaint  Patient presents with  . multiple complaints      (Consider location/radiation/quality/duration/timing/severity/associated sxs/prior Treatment) Patient is a 29 y.o. male presenting with flu symptoms.  Influenza Presenting symptoms: cough, fatigue, fever, headache, myalgias and rhinorrhea   Presenting symptoms: no diarrhea, no nausea, no shortness of breath, no sore throat and no vomiting   Severity:  Moderate Duration: Yesterday evening. Progression:  Worsening Chronicity:  New Relieved by:  Nothing Worsened by:  Nothing tried Ineffective treatments:  Decongestant, OTC medications and rest Associated symptoms: chills, decreased appetite and nasal congestion   Associated symptoms: no ear pain, no mental status change, no neck stiffness and no witnessed syncope   Risk factors: not elderly, no diabetes problem, no heart disease, no immunocompromised state, no kidney disease, no liver disease and no sick contacts     Past Medical History  Diagnosis Date  . Gastritis    History reviewed. No pertinent past surgical history. No family history on file. History  Substance Use Topics  . Smoking status: Current Some Day Smoker    Types: Cigarettes  . Smokeless tobacco: Not on file  . Alcohol Use: Yes     Comment: occasion    Review of Systems  Constitutional: Positive for fever, chills, fatigue and decreased appetite. Negative for activity change and appetite change.  HENT: Positive for congestion and rhinorrhea. Negative for ear pain, facial swelling, sore throat and trouble swallowing.   Eyes: Negative for photophobia and pain.  Respiratory: Positive for cough. Negative for chest tightness and shortness of breath.   Cardiovascular: Negative for chest pain and leg swelling.  Gastrointestinal: Negative for nausea, vomiting, abdominal pain,  diarrhea and constipation.  Endocrine: Negative for polydipsia and polyuria.  Genitourinary: Negative for dysuria, urgency, decreased urine volume and difficulty urinating.  Musculoskeletal: Positive for myalgias. Negative for back pain, gait problem and neck stiffness.  Skin: Negative for color change, rash and wound.  Allergic/Immunologic: Negative for immunocompromised state.  Neurological: Positive for headaches. Negative for dizziness, facial asymmetry, speech difficulty, weakness and numbness.  Psychiatric/Behavioral: Negative for confusion, decreased concentration and agitation.      Allergies  Penicillins  Home Medications   Prior to Admission medications   Medication Sig Start Date End Date Taking? Authorizing Provider  dextromethorphan (DELSYM) 30 MG/5ML liquid Take 5 mLs (30 mg total) by mouth as needed for cough. Patient not taking: Reported on 05/22/2014 03/31/14   Garlon Hatchet, PA-C  fluticasone [facility]) 50 MCG/ACT nasal spray Place 2 sprays into both nostrils daily. Patient not taking: Reported on 03/31/2014 11/17/13   Donnita Falls Camprubi-Soms, PA-C  ondansetron (ZOFRAN ODT) 4 MG disintegrating tablet  ODT q4 hours prn nausea/vomit Patient not taking: Reported on 05/22/2014 05/01/14   Dahlia Client Muthersbaugh, PA-C  oxyCODONE-acetaminophen (PERCOCET) 5-325 MG per tablet Take 1-2 tablets by mouth every 4 (four) hours as needed. Patient not taking: Reported on 05/22/2014 05/01/14   Dahlia Client Muthersbaugh, PA-C   BP 122/69 mmHg  Pulse 69  Temp(Src) 97.9 F (36.6 C) (Oral)  Resp 16  Wt 205 lb (92.987 kg)  SpO2 98% Physical Exam  Constitutional: He is oriented to person, place, and time. He appears well-developed and well-nourished. No distress.  HENT:  Head: Normocephalic and atraumatic.  Mouth/Throat: No oropharyngeal exudate.  Eyes: Pupils are equal, round, and reactive to light.  Neck:  Normal range of motion. Neck supple.  Cardiovascular: Normal rate, regular rhythm and  normal heart sounds.  Exam reveals no gallop and no friction rub.   No murmur heard. Pulmonary/Chest: Effort normal and breath sounds normal. No respiratory distress. He has no wheezes. He has no rales.  Abdominal: Soft. Bowel sounds are normal. He exhibits no distension and no mass. There is no tenderness. There is no rebound and no guarding.  Musculoskeletal: Normal range of motion. He exhibits no edema or tenderness.  Neurological: He is alert and oriented to person, place, and time.  Skin: Skin is warm and dry.  Psychiatric: He has a normal mood and affect.    ED Course  Procedures (including critical care time) Labs Review Labs Reviewed - No data to display  Imaging Review No results found.   EKG Interpretation None      MDM   Final diagnoses:  Influenza-like illness    Pt is a [age] y.o. male with Pmhx as above who presents with flulike illness since last night.  Patient is afebrile.  Vitals are stable.  On exam, he is well-appearing.  I doubt pneumonia given lack of fever, clear lung fields.  Patient given IM Reglan for headache.  Toradol for body aches in the department.  Patient we discharged home with instructions for continued supportive care, rest and by mouth hydration.     Si Raiderhristopher A Cuddeback evaluation in the Emergency Department is complete. It has been determined that no acute conditions requiring further emergency intervention are present at this time. The patient/guardian have been advised of the diagnosis and plan. We have discussed signs and symptoms that warrant return to the ED, such as changes or worsening in symptoms, worsening pain, chest pain, shortness of breath      Toy CookeyMegan Ludmila Ebarb, MD 05/23/14 0015

## 2014-05-23 NOTE — Discharge Instructions (Signed)
Influenza Influenza (flu) is an infection in the mouth, nose, and throat (respiratory tract) caused by a virus. The flu can make you feel very ill. Influenza spreads easily from person to person (contagious).  HOME CARE   Only take medicines as told by your doctor.  Use a cool mist humidifier to make breathing easier.  Get plenty of rest until your fever goes away. This usually takes 3 to 4 days.  Drink enough fluids to keep your pee (urine) clear or pale yellow.  Cover your mouth and nose when you cough or sneeze.  Wash your hands well to avoid spreading the flu.  Stay home from work or school until your fever has been gone for at least 1 full day.  Get a flu shot every year. GET HELP RIGHT AWAY IF:   You have trouble breathing or feel short of breath.  Your skin or nails turn blue.  You have severe neck pain or stiffness.  You have a severe headache, facial pain, or earache.  Your fever gets worse or keeps coming back.  You feel sick to your stomach (nauseous), throw up (vomit), or have watery poop (diarrhea).  You have chest pain.  You have a deep cough that gets worse, or you cough up more thick spit (mucus). MAKE SURE YOU:   Understand these instructions.  Will watch your condition.  Will get help right away if you are not doing well or get worse. Document Released: 12/13/2007 Document Revised: 07/20/2013 Document Reviewed: 06/04/2011 [employer] Patient Information 2015 [employer]. This information is not intended to replace advice given to you by your health care provider. Make sure you discuss any questions you have with your health care provider.  

## 2014-11-04 ENCOUNTER — Emergency Department (HOSPITAL_COMMUNITY)
Admission: EM | Admit: 2014-11-04 | Discharge: 2014-11-04 | Disposition: A | Discharge status: Home / Self Care | Payer: No Typology Code available for payment source | Attending: Emergency Medicine | Admitting: Emergency Medicine

## 2014-11-04 ENCOUNTER — Encounter (HOSPITAL_COMMUNITY): Payer: Self-pay | Admitting: Cardiology

## 2014-11-04 DIAGNOSIS — G479 Sleep disorder, unspecified: Secondary | ICD-10-CM

## 2014-11-04 DIAGNOSIS — G4700 Insomnia, unspecified: Secondary | ICD-10-CM | POA: Insufficient documentation

## 2014-11-04 DIAGNOSIS — F411 Generalized anxiety disorder: Secondary | ICD-10-CM | POA: Insufficient documentation

## 2014-11-04 DIAGNOSIS — Z720 Tobacco use: Secondary | ICD-10-CM | POA: Insufficient documentation

## 2014-11-04 DIAGNOSIS — F430 Acute stress reaction: Secondary | ICD-10-CM | POA: Insufficient documentation

## 2014-11-04 DIAGNOSIS — Z880 Allergy status to penicillin: Secondary | ICD-10-CM | POA: Insufficient documentation

## 2014-11-04 DIAGNOSIS — Z7951 Long term (current) use of inhaled steroids: Secondary | ICD-10-CM | POA: Insufficient documentation

## 2014-11-04 DIAGNOSIS — Z8719 Personal history of other diseases of the digestive system: Secondary | ICD-10-CM | POA: Insufficient documentation

## 2014-11-04 DIAGNOSIS — Z566 Other physical and mental strain related to work: Secondary | ICD-10-CM | POA: Insufficient documentation

## 2014-11-04 DIAGNOSIS — R4584 Anhedonia: Secondary | ICD-10-CM

## 2014-11-04 NOTE — Discharge Instructions (Signed)
Your sleep issues are likely due to stress. Try turning off all electronics 30 minutes before you want to sleep, and practice deep relaxing breathing techniques to help you sleep. No caffeine or sodas after 2pm. Avoid napping and try to always wake up at the same time every day. You can try over the counter melatonin as well. You will need to use the list below to find a regular doctor as well as a therapist. Return to the ER for changes or worsening symptoms.   Stress and Stress Management Stress is a normal reaction to life events. It is what you feel when life demands more than you are used to or more than you can handle. Some stress can be useful. For example, the stress reaction can help you catch the last bus of the day, study for a test, or meet a deadline at work. But stress that occurs too often or for too long can cause problems. It can affect your emotional health and interfere with relationships and normal daily activities. Too much stress can weaken your immune system and increase your risk for physical illness. If you already have a medical problem, stress can make it worse. CAUSES  All sorts of life events may cause stress. An event that causes stress for one person may not be stressful for another person. Major life events commonly cause stress. These may be positive or negative. Examples include losing your job, moving into a new home, getting married, having a baby, or losing a loved one. Less obvious life events may also cause stress, especially if they occur day after day or in combination. Examples include working long hours, driving in traffic, caring for children, being in debt, or being in a difficult relationship. SIGNS AND SYMPTOMS Stress may cause emotional symptoms including, the following:  Anxiety. This is feeling worried, afraid, on edge, overwhelmed, or out of control.  Anger. This is feeling irritated or impatient.  Depression. This is feeling sad, down, helpless, or  guilty.  Difficulty focusing, remembering, or making decisions. Stress may cause physical symptoms, including the following:   Aches and pains. These may affect your head, neck, back, stomach, or other areas of your body.  Tight muscles or clenched jaw.  Low energy or trouble sleeping. Stress may cause unhealthy behaviors, including the following:   Eating to feel better (overeating) or skipping meals.  Sleeping too little, too much, or both.  Working too much or putting off tasks (procrastination).  Smoking, drinking alcohol, or using drugs to feel better. DIAGNOSIS  Stress is diagnosed through an assessment by your health care provider. Your health care provider will ask questions about your symptoms and any stressful life events.Your health care provider will also ask about your medical history and may order blood tests or other tests. Certain medical conditions and medicine can cause physical symptoms similar to stress. Mental illness can cause emotional symptoms and unhealthy behaviors similar to stress. Your health care provider may refer you to a mental health professional for further evaluation.  TREATMENT  Stress management is the recommended treatment for stress.The goals of stress management are reducing stressful life events and coping with stress in healthy ways.  Techniques for reducing stressful life events include the following:  Stress identification. Self-monitor for stress and identify what causes stress for you. These skills may help you to avoid some stressful events.  Time management. Set your priorities, keep a calendar of events, and learn to say "no." These tools can help you  avoid making too many commitments. Techniques for coping with stress include the following:  Rethinking the problem. Try to think realistically about stressful events rather than ignoring them or overreacting. Try to find the positives in a stressful situation rather than focusing on  the negatives.  Exercise. Physical exercise can release both physical and emotional tension. The key is to find a form of exercise you enjoy and do it regularly.  Relaxation techniques. These relax the body and mind. Examples include yoga, meditation, tai chi, biofeedback, deep breathing, progressive muscle relaxation, listening to music, being out in nature, journaling, and other hobbies. Again, the key is to find one or more that you enjoy and can do regularly.  Healthy lifestyle. Eat a balanced diet, get plenty of sleep, and do not smoke. Avoid using alcohol or drugs to relax.  Strong support network. Spend time with family, friends, or other people you enjoy being around.Express your feelings and talk things over with someone you trust. Counseling or talktherapy with a mental health professional may be helpful if you are having difficulty managing stress on your own. Medicine is typically not recommended for the treatment of stress.Talk to your health care provider if you think you need medicine for symptoms of stress. HOME CARE INSTRUCTIONS  Keep all follow-up visits as directed by your health care provider.  Take all medicines as directed by your health care provider. SEEK MEDICAL CARE IF:  Your symptoms get worse or you start having new symptoms.  You feel overwhelmed by your problems and can no longer manage them on your own. SEEK IMMEDIATE MEDICAL CARE IF:  You feel like hurting yourself or someone else. Document Released: 08/29/2000 Document Revised: 07/20/2013 Document Reviewed: 10/28/2012 [facility] Patient Information 2015 [place], [place]. This information is not intended to replace advice given to you by your health care provider. Make sure you discuss any questions you have with your health care provider.   Insomnia Insomnia is frequent trouble falling and/or staying asleep. Insomnia can be a long term problem or a short term problem. Both are common. Insomnia can be a  short term problem when the wakefulness is related to a certain stress or worry. Long term insomnia is often related to ongoing stress during waking hours and/or poor sleeping habits. Overtime, sleep deprivation itself can make the problem worse. Every little thing feels more severe because you are overtired and your ability to cope is decreased. CAUSES   Stress, anxiety, and depression.  Poor sleeping habits.  Distractions such as TV in the bedroom.  Naps close to bedtime.  Engaging in emotionally charged conversations before bed.  Technical reading before sleep.  Alcohol and other sedatives. They may make the problem worse. They can hurt normal sleep patterns and normal dream activity.  Stimulants such as caffeine for several hours prior to bedtime.  Pain syndromes and shortness of breath can cause insomnia.  Exercise late at night.  Changing time zones may cause sleeping problems (jet lag). It is sometimes helpful to have someone observe your sleeping patterns. They should look for periods of not breathing during the night (sleep apnea). They should also look to see how long those periods last. If you live alone or observers are uncertain, you can also be observed at a sleep clinic where your sleep patterns will be professionally monitored. Sleep apnea requires a checkup and treatment. Give your caregivers your medical history. Give your caregivers observations your family has made about your sleep.  SYMPTOMS   Not feeling  rested in the morning.  Anxiety and restlessness at bedtime.  Difficulty falling and staying asleep. TREATMENT   Your caregiver may prescribe treatment for an underlying medical disorders. Your caregiver can give advice or help if you are using alcohol or other drugs for self-medication. Treatment of underlying problems will usually eliminate insomnia problems.  Medications can be prescribed for short time use. They are generally not recommended for lengthy  use.  Over-the-counter sleep medicines are not recommended for lengthy use. They can be habit forming.  You can promote easier sleeping by making lifestyle changes such as:  Using relaxation techniques that help with breathing and reduce muscle tension.  Exercising earlier in the day.  Changing your diet and the time of your last meal. No night time snacks.  Establish a regular time to go to bed.  Counseling can help with stressful problems and worry.  Soothing music and white noise may be helpful if there are background noises you cannot remove.  Stop tedious detailed work at least one hour before bedtime. HOME CARE INSTRUCTIONS   Keep a diary. Inform your caregiver about your progress. This includes any medication side effects. See your caregiver regularly. Take note of:  Times when you are asleep.  Times when you are awake during the night.  The quality of your sleep.  How you feel the next day. This information will help your caregiver care for you.  Get out of bed if you are still awake after 15 minutes. Read or do some quiet activity. Keep the lights down. Wait until you feel sleepy and go back to bed.  Keep regular sleeping and waking hours. Avoid naps.  Exercise regularly.  Avoid distractions at bedtime. Distractions include watching television or engaging in any intense or detailed activity like attempting to balance the household checkbook.  Develop a bedtime ritual. Keep a familiar routine of bathing, brushing your teeth, climbing into bed at the same time each night, listening to soothing music. Routines increase the success of falling to sleep faster.  Use relaxation techniques. This can be using breathing and muscle tension release routines. It can also include visualizing peaceful scenes. You can also help control troubling or intruding thoughts by keeping your mind occupied with boring or repetitive thoughts like the old concept of counting sheep. You can  make it more creative like imagining planting one beautiful flower after another in your backyard garden.  During your day, work to eliminate stress. When this is not possible use some of the previous suggestions to help reduce the anxiety that accompanies stressful situations. MAKE SURE YOU:   Understand these instructions.  Will watch your condition.  Will get help right away if you are not doing well or get worse. Document Released: 03/02/2000 Document Revised: 05/28/2011 Document Reviewed: 04/02/2007 [facility] Patient Information 2015 [place], [place]. This information is not intended to replace advice given to you by your health care provider. Make sure you discuss any questions you have with your health care provider.  Emergency Department Resource Guide 1) Find a Doctor and Pay Out of Pocket Although you won't have to find out who is covered by your insurance plan, it is a good idea to ask around and get recommendations. You will then need to call the office and see if the doctor you have chosen will accept you as a new patient and what types of options they offer for patients who are self-pay. Some doctors offer discounts or will set up payment plans for their  patients who do not have insurance, but you will need to ask so you aren't surprised when you get to your appointment.  2) Contact Your Local Health Department Not all health departments have doctors that can see patients for sick visits, but many do, so it is worth a call to see if yours does. If you don't know where your local health department is, you can check in your phone book. The CDC also has a tool to help you locate your state's health department, and many state websites also have listings of all of their local health departments.  3) Find a [facility] If your illness is not likely to be very severe or complicated, you may want to try a walk in clinic. These are popping up all over the country in pharmacies, drugstores,  and shopping centers. They're usually staffed by nurse practitioners or physician assistants that have been trained to treat common illnesses and complaints. They're usually fairly quick and inexpensive. However, if you have serious medical issues or chronic medical problems, these are probably not your best option.  No Primary Care Doctor: - Call Health Connect at  [phone] - they can help you locate a primary care doctor that  accepts your insurance, provides certain services, etc. - Physician Referral Service- [phone]  Chronic Pain Problems: Organization         Address  Phone   Notes  [facility]  [phone] Patients need to be referred by their primary care doctor.   Medication Assistance: Organization         Address  Phone   Notes  [facility] Medication [facility] [address] [place], [place] [address] [phone] --Must be a resident of [facility] -- Must have NO insurance coverage whatsoever (no Medicaid/ Medicare, etc.) -- The pt. MUST have a primary care doctor that directs their care regularly and follows them in the community   MedAssist  [phone]   [employer]  [phone]    Agencies that provide inexpensive medical care: Organization         Address  Phone   Notes  [place]  [phone]   [facility]    [phone]   [facility] [place], [place] [address] [phone]   [place] [address] [place] [phone]   Planned Parenthood    [phone]   [facility]    [phone]   [place] [address], [place] Phone:  [phone], Fax:  [phone] Hours of Operation:  9 am - 6 pm, M-F.  Also accepts Medicaid/Medicare and self-pay.  [facility] for [place], [address], [place] Phone: [phone], Fax: [phone]. Hours of Operation:  8:30 am - 5:30 pm, M-F.  Also accepts Medicaid and self-pay.  [facility] [place] [address], [place] Phone: [phone]   [place], [place], [place] [phone], Ext. [phone] Mondays & Thursdays: 7-9 AM.  First 15 patients are seen on a first come, first serve basis.    [place] Providers:  Organization         Address  Phone   Notes  [facility] [address], [place] [phone] Also accepts self-pay patients.  Elba Barman  [facility] [place], [place]  [phone]   [place], [address], [place] [phone]   [place] [address], [place] [phone]   Lucianne Lei [address], [place]   [phone] Only accepts [place] Access [place] patients after they have their name applied to their card.   Self-Pay (no insurance) in [facility]:  Organization         Address  Phone   Notes  Sickle Cell Patients, [facility] Internal Medicine [place] [phone]   [facility] Urgent Care [place] [phone]   [facility] [place]  [place], [place], [place] [phone]   [facility]/Dr. Osei-Bonsu  [address], [place] or [address], [place] [phone] Phone number for both [place] and [place] locations is the same.  Urgent Medical and [facility] [address], [place] [phone]   [facility] [address] [place] or [address] [phone]   [facility] [address], [place] [phone], phone; [phone], fax Sees patients 1st and 3rd Saturday of every month.  Must not qualify for public or  private insurance (i.e. Medicaid, Medicare, [place] Health Choice, Veterans' Benefits)  Household income should be no more than 200% of the poverty level The clinic cannot treat you if you are pregnant or think you are pregnant  Sexually transmitted diseases are not treated at the clinic.    Dental Care: Organization         Address  Phone  Notes  [facility] Department of [facility] [place] [phone] Accepts children up to age 75 who are enrolled in [place] or [place]; pregnant women with a Medicaid card; and children who have applied for Medicaid or [place] Health Choice, but were declined, whose parents can pay a reduced fee at time of service.  [facility] Department of [facility]  [address], [place] [phone] Accepts children up to age 6 who are enrolled in [place] or [place]; pregnant women with a Medicaid card; and children who have applied for Medicaid or [place] Health Choice, but were declined, whose parents can pay a reduced fee at time of service.  [place] Adult Dental Access PROGRAM  [place] [phone] Patients are seen by appointment only. Walk-ins are not accepted. [place] will see patients 10 years of age and older. Monday - Tuesday (8am-5pm) Most Wednesdays (8:30-5pm) $30 per visit, cash only  [facility] Adult Dental Access PROGRAM  [address], [facility] [phone] Patients are seen by appointment only. Walk-ins are not accepted. [place] will see patients 23 years of age and older. One Wednesday Evening (Monthly: Volunteer Based).  $30 per visit, cash only  [place]  [phone] for adults; Children under age 49, call Graduate Pediatric Dentistry at [phone]. Children aged 104-14, please call [phone] to request a pediatric application.  Dental services are provided in all areas of dental  care including fillings, crowns and bridges, complete and partial dentures, implants, gum treatment, root canals, and extractions. Preventive care is also provided. Treatment is provided to both adults and  children. Patients are selected via a lottery and there is often a waiting list.   [facility] [address], [place]  [phone] [email or web address]   [facility] [address] [place], [place] [phone], Ext. [phone] Second and Fourth Thursday of each month, opens at 6:30 AM; Clinic ends at 9 AM.  Patients are seen on a first-come first-served basis, and a limited number are seen during each clinic.   [facility]  [address] [place], [place] [phone]   Eligibility Requirements You must have lived in [place], [place], or [place] counties for at least the last three months.   You cannot be eligible for state or federal sponsored [employer], including [employer], [place], or [employer].   You generally cannot be eligible for healthcare insurance through your employer.    How to apply: Eligibility screenings are held every Tuesday and Wednesday afternoon from 1:00 pm until 4:00 pm. You do not need an appointment for the interview!  [facility] [address] [place], [place], [place]   [place]  [place] Department  [place]  [phone]    Behavioral Health Resources in the Community: Intensive Outpatient Programs Organization         Address  Phone  Notes  [place]. [address] [place], [place] [phone]   [facility] Outpatient [address], [place], [place]   ADS: Alcohol & Drug Svcs [address], [place], [place]   [place] [address]  [place], [place] or [phone]    Substance Abuse Resources Organization         Address  Phone  Notes  Alcohol and Drug Services  [phone]   [place]  [phone]   [facility] [place]   Chinita Pester  [phone]   Residential & Outpatient Substance Abuse Program  [phone]   Psychological Services Organization         Address  Phone  Notes  [facility] [place]  [place]  [phone]   [place] [address], [place] or [phone]    Mobile Crisis Teams Organization         Address  Phone  Notes  [facility], Mobile Crisis Care Unit  [phone]   [facility]ive Psychotherapeutic Services  [address] [place], [place]   [place] [address] [place] [phone]    Self-Help/Support Groups Organization         Address  Phone             Notes  [place]. of [place] - variety of support groups  [place] Call for more information  Narcotics Anonymous (NA), [facility] [address], [employer] [place]  2 meetings at this location   Special educational needs teacher         Address  Phone  Notes  ASAP Residential Treatment [place],    [place]  [phone]   [facility]  [address], [place] [id number], [place], [place]   [place], [place] [phone] Admissions: 8am-3pm M-F  Incentives Substance [place] [address],    [place], [place]   [facility]  [address] Warm [place], [place]   [facility] [address]  [place], [place]   [facility]ight Programs - Intensive Outpatient [address], [place], [place]   [facility] [address]  [place], [place] [phone] or [phone]   Residential Treatment  Services (RTS) [address], [place], [place] Accepts Medicaid  [facility] [place] [address],  [place] [phone] Substance Abuse/Addiction Treatment   [facility] Organization         Address  Phone  Notes  [facility]  [phone]   Domenic Schwab, PhD [address] [place], [place]   [phone] or [phone]   [place], [place] [phone]   [place] [address], [place], [place] [phone] Insurance/Medicaid/sponsorship through [facility] and Families [address], [address] [place]                                    [place], [place] [phone] [place] [address], [place] [phone]    Dr. Adele Schilder  [phone]   [facility] [place] Dept. 1) [address] [place] 2) [place] 3)  [place] [address], [place] [phone]  [phone]   [place] [phone] or [phone] (After Hours)

## 2014-11-04 NOTE — ED Provider Notes (Signed)
CSN: [id number]     Arrival date & time 11/04/14  1132 History   First MD Initiated Contact with Patient 11/04/14 1157     Chief Complaint  Patient presents with  . Insomnia     (Consider location/radiation/quality/duration/timing/severity/associated sxs/prior Treatment) HPI Comments: Manuel Silva is a 29 y.o. male with a PMHx of gastritis, who presents to the ED with complaints of insomnia. He reports he's had trouble falling asleep and staying asleep, stating that he is getting 2-3 hours of sleep per night. He works for [employer] and has had a lot of stress at work recently, and has felt more anxious and less interested in his normal activities.+Anhedonia. He denies any SI/HI/AVH, denies illicit drug use or alcohol use. He is a smoker. He denies use of any sodas in the afternoons or evenings, no caffeine use at night, he takes no naps during the day, and he wakes up every day at 6 AM regardless of whether he is working or not. No changes in medications. He denies fevers or chills, chest pain, shortness of breath, abdominal pain, nausea, vomiting, diarrhea, constipation, dysuria, hematuria, numbness, tingling, weakness, syncope, headache, or vision changes. No PCP.   Patient is a 29 y.o. male presenting with insomnia. The history is provided by the patient. No language interpreter was used.  Insomnia This is a new problem. The current episode started more than 1 month ago. The problem occurs constantly. The problem has been unchanged. Pertinent negatives include no abdominal pain, arthralgias, chest pain, chills, fever, headaches, myalgias, nausea, numbness, vomiting or weakness. The symptoms are aggravated by stress. He has tried nothing for the symptoms. The treatment provided no relief.    Past Medical History  Diagnosis Date  . Gastritis    History reviewed. No pertinent past surgical history. History reviewed. No pertinent family history. Social History  Substance Use Topics  .  Smoking status: Current Some Day Smoker    Types: Cigarettes  . Smokeless tobacco: None  . Alcohol Use: Yes     Comment: occasion    Review of Systems  Constitutional: Negative for fever and chills.  Eyes: Negative for visual disturbance.  Respiratory: Negative for shortness of breath.   Cardiovascular: Negative for chest pain.  Gastrointestinal: Negative for nausea, vomiting, abdominal pain, diarrhea and constipation.  Genitourinary: Negative for dysuria and hematuria.  Musculoskeletal: Negative for myalgias and arthralgias.  Skin: Negative for color change.  Allergic/Immunologic: Negative for immunocompromised state.  Neurological: Negative for weakness, light-headedness, numbness and headaches.  Psychiatric/Behavioral: Positive for sleep disturbance. Negative for suicidal ideas and confusion. The patient is nervous/anxious and has insomnia.    10 Systems reviewed and are negative for acute change except as noted in the HPI.    Allergies  Penicillins  Home Medications   Prior to Admission medications   Medication Sig Start Date End Date Taking? Authorizing Provider  dextromethorphan (DELSYM) 30 MG/5ML liquid Take 5 mLs (30 mg total) by mouth as needed for cough. Patient not taking: Reported on 05/22/2014 03/31/14   Garlon Hatchet, PA-C  fluticasone [facility]) 50 MCG/ACT nasal spray Place 2 sprays into both nostrils daily. Patient not taking: Reported on 03/31/2014 11/17/13   Iesha Summerhill Camprubi-Soms, PA-C  ondansetron (ZOFRAN ODT) 4 MG disintegrating tablet  ODT q4 hours prn nausea/vomit Patient not taking: Reported on 05/22/2014 05/01/14   Dahlia Client Muthersbaugh, PA-C  oxyCODONE-acetaminophen (PERCOCET) 5-325 MG per tablet Take 1-2 tablets by mouth every 4 (four) hours as needed. Patient not taking: Reported on 05/22/2014  05/01/14   Hannah Muthersbaugh, PA-C   BP 119/69 mmHg  Pulse 70  Temp(Src) 98.4 F (36.9 C) (Oral)  Resp 18  Ht 5\' 9"  (1.753 m)  Wt 190 lb (86.183 kg)  BMI 28.05  kg/m2  SpO2 99% Physical Exam  Constitutional: He is oriented to person, place, and time. Vital signs are normal. He appears well-developed and well-nourished.  Non-toxic appearance. No distress.  Afebrile, nontoxic, NAD  HENT:  Head: Normocephalic and atraumatic.  Mouth/Throat: Oropharynx is clear and moist and mucous membranes are normal.  Eyes: Conjunctivae and EOM are normal. Right eye exhibits no discharge. Left eye exhibits no discharge.  Neck: Normal range of motion. Neck supple.  Cardiovascular: Normal rate, regular rhythm, normal heart sounds and intact distal pulses.  Exam reveals no gallop and no friction rub.   No murmur heard. Pulmonary/Chest: Effort normal and breath sounds normal. No respiratory distress. He has no decreased breath sounds. He has no wheezes. He has no rhonchi. He has no rales.  Abdominal: Soft. Normal appearance and bowel sounds are normal. He exhibits no distension. There is no tenderness. There is no rigidity, no rebound, no guarding, no CVA tenderness, no tenderness at McBurney's point and negative Murphy's sign.  Musculoskeletal: Normal range of motion.  Neurological: He is alert and oriented to person, place, and time. He has normal strength. No sensory deficit.  Skin: Skin is warm, dry and intact. No rash noted.  Psychiatric: He has a normal mood and affect. His speech is normal and behavior is normal. He expresses no homicidal and no suicidal ideation. He expresses no suicidal plans and no homicidal plans.  No SI/HI/AVH. Complains of insomnia  Nursing note and vitals reviewed.   ED Course  Procedures (including critical care time) Labs Review Labs Reviewed - No data to display  Imaging Review No results found. I have personally reviewed and evaluated these images and lab results as part of my medical decision-making.   EKG Interpretation None      MDM   Final diagnoses:  Sleep disorder  Anxiety state  Anhedonia  Stress at work   Insomnia    29 y.o. male here with c/o stress, anxiety, and insomnia. No medical complaints. Sleep issues likely from stress, discussed sleep hygiene and OTC melatonin, as well as resource guide for behavioral health services as outpt. Will also have him find PCP from resource guide. Doubt need for labs. Doubt need for starting medications today, would prefer that he seek therapy first and see if that helps with his sleep. No SI/HI/AVH. Stable for d/c. I explained the diagnosis and have given explicit precautions to return to the ER including for any other new or worsening symptoms. The patient understands and accepts the medical plan as it's been dictated and I have answered their questions. Discharge instructions concerning home care and prescriptions have been given. The patient is STABLE and is discharged to home in good condition.  BP 119/69 mmHg  Pulse 70  Temp(Src) 98.4 F (36.9 C) (Oral)  Resp 18  Ht 5\' 9"  (1.753 m)  Wt 190 lb (86.183 kg)  BMI 28.05 kg/m2  SpO2 99%  No orders of the defined types were placed in this encounter.       [address] [place], PA-C 11/04/14 1225  Raeford Razor, MD 11/05/14 [phone]

## 2014-11-04 NOTE — ED Notes (Signed)
Pt reports that he has been stressed out and unable to sleep. States that he has a lot going on with his job and kids. Denies any pain

## 2014-11-08 ENCOUNTER — Encounter (HOSPITAL_COMMUNITY): Payer: Self-pay | Admitting: Emergency Medicine

## 2014-11-08 ENCOUNTER — Emergency Department (HOSPITAL_COMMUNITY)
Admission: EM | Admit: 2014-11-08 | Discharge: 2014-11-08 | Disposition: A | Discharge status: Home / Self Care | Payer: No Typology Code available for payment source | Attending: Emergency Medicine | Admitting: Emergency Medicine

## 2014-11-08 DIAGNOSIS — G4700 Insomnia, unspecified: Secondary | ICD-10-CM | POA: Insufficient documentation

## 2014-11-08 DIAGNOSIS — G479 Sleep disorder, unspecified: Secondary | ICD-10-CM

## 2014-11-08 DIAGNOSIS — Z720 Tobacco use: Secondary | ICD-10-CM | POA: Insufficient documentation

## 2014-11-08 DIAGNOSIS — Z880 Allergy status to penicillin: Secondary | ICD-10-CM | POA: Insufficient documentation

## 2014-11-08 DIAGNOSIS — Z8719 Personal history of other diseases of the digestive system: Secondary | ICD-10-CM | POA: Insufficient documentation

## 2014-11-08 MED ORDER — MELATONIN 3 MG PO CAPS: 3.0000 mg | ORAL_CAPSULE | Freq: Every day | ORAL | Status: DC

## 2014-11-08 NOTE — ED Notes (Signed)
Pt has been told by EDP has insomnia. States he hasnt been able to sleep for 3 days. Started 3 weeks ago. States he just can't shut down. Denies SI/HI.

## 2014-11-08 NOTE — Discharge Instructions (Signed)
It is important for you to follow-up with [place] [facility] and wellness in order to establish primary care and for further evaluation and management of your symptoms. You may take your melatonin as prescribed, each night before bed to help with sleeping. It is also important for you to employ a consistent exercise regimen this may also help with your sleeping pattern. Return to ED for your new or worsening symptoms.

## 2014-11-08 NOTE — ED Notes (Signed)
Declined W/C at D/C and was escorted to lobby by RN. 

## 2014-11-08 NOTE — ED Notes (Signed)
PT reports work stress may effect his sleep.

## 2014-11-08 NOTE — ED Provider Notes (Signed)
CSN: [id number]     Arrival date & time 11/08/14  7829 History   First MD Initiated Contact with Patient 11/08/14 0830     Chief Complaint  Patient presents with  . Insomnia     (Consider location/radiation/quality/duration/timing/severity/associated sxs/prior Treatment) HPI Manuel Silva is a 29 y.o. male who comes in for evaluation of inability to sleep. Patient states this is an ongoing problem for the past 3 or 4 days. States that he thinks he may be stressed out due to his job. Patient works as a Designer, television/film set. Denies any new daily habits, new medications or drugs. Denies any illicit drug use, specifically cocaine, methamphetamine. Has not tried anything to improve his symptoms. He reports he typically will try to count sheep, but this is not working and he feels like "my body just won't shut down". Denies any suicidal or homicidal ideations. No auditory or visual hallucinations. Does not have a PCP and has not seen anybody for this problem. No fevers, chills, sensations of feeling hot or cold, night sweats, weight gain or loss, abdominal pain, chest pain, shortness of breath.  Past Medical History  Diagnosis Date  . Gastritis    History reviewed. No pertinent past surgical history. History reviewed. No pertinent family history. Social History  Substance Use Topics  . Smoking status: Current Some Day Smoker    Types: Cigarettes  . Smokeless tobacco: None  . Alcohol Use: Yes     Comment: occasion    Review of Systems A 10 point review of systems was completed and was negative except for pertinent positives and negatives as mentioned in the history of present illness     Allergies  Penicillins  Home Medications   Prior to Admission medications   Medication Sig Start Date End Date Taking? Authorizing Provider  dextromethorphan (DELSYM) 30 MG/5ML liquid Take 5 mLs (30 mg total) by mouth as needed for cough. Patient not taking: Reported on 05/22/2014 03/31/14    Garlon Hatchet, PA-C  fluticasone [facility]) 50 MCG/ACT nasal spray Place 2 sprays into both nostrils daily. Patient not taking: Reported on 03/31/2014 11/17/13   Mercedes Camprubi-Soms, PA-C  Melatonin 3 MG CAPS Take 1 capsule (3 mg total) by mouth at bedtime. 11/08/14   Joycie Peek, PA-C  ondansetron (ZOFRAN ODT) 4 MG disintegrating tablet 4mg  ODT q4 hours prn nausea/vomit Patient not taking: Reported on 05/22/2014 05/01/14   Dahlia Client Muthersbaugh, PA-C  oxyCODONE-acetaminophen (PERCOCET) 5-325 MG per tablet Take 1-2 tablets by mouth every 4 (four) hours as needed. Patient not taking: Reported on 05/22/2014 05/01/14   Dahlia Client Muthersbaugh, PA-C   BP 139/72 mmHg  Pulse 67  Temp(Src) 98.7 F (37.1 C) (Oral)  Resp 20  Ht 5\' 9"  (1.753 m)  Wt 190 lb (86.183 kg)  BMI 28.05 kg/m2  SpO2 98% Physical Exam  Constitutional: He is oriented to person, place, and time. He appears well-developed and well-nourished.  HENT:  Head: Normocephalic and atraumatic.  Mouth/Throat: Oropharynx is clear and moist.  Eyes: Conjunctivae are normal. Pupils are equal, round, and reactive to light. Right eye exhibits no discharge. Left eye exhibits no discharge. No scleral icterus.  Neck: Normal range of motion. Neck supple. No thyromegaly present.  Cardiovascular: Normal rate, regular rhythm and normal heart sounds.   Pulmonary/Chest: Effort normal and breath sounds normal. No respiratory distress. He has no wheezes. He has no rales.  Abdominal: Soft. There is no tenderness.  Musculoskeletal: He exhibits no tenderness.  Neurological: He is alert and  oriented to person, place, and time.  Cranial Nerves II-XII grossly intact  Skin: Skin is warm and dry. No rash noted.  Psychiatric: He has a normal mood and affect.  Patient is GCS 15, alert and oriented 4. Has appropriate affect and answers questions appropriately. No evidence of mania or other personality disorder.  Nursing note and vitals reviewed.   ED Course   Procedures (including critical care time) Labs Review Labs Reviewed - No data to display  Imaging Review No results found. I have personally reviewed and evaluated these images and lab results as part of my medical decision-making.   EKG Interpretation None     Meds given in ED:  Medications - No data to display  Discharge Medication List as of 11/08/2014  8:44 AM    START taking these medications   Details  Melatonin 3 MG CAPS Take 1 capsule (3 mg total) by mouth at bedtime., Starting 11/08/2014, Until Discontinued, Print       Filed Vitals:   11/08/14 0809  BP: 139/72  Pulse: 67  Temp: 98.7 F (37.1 C)  TempSrc: Oral  Resp: 20  Height:  (1.753 m)  Weight: 190 lb (86.183 kg)  SpO2: 98%     MDM  Vitals stable - WNL -afebrile Pt resting comfortably in ED. PE--physical exam is grossly benign. No enlarged thyroid  DDX--patient here for inability to sleep for the past 3 days. Encouraged employing regular exercise routine, sleep hygiene. Discharged with prescription for melatonin and referral to community health and wellness in order to establish primary care. Low suspicion for schizophrenia or other psychiatric illness. Low suspicion for emergent thyroid condition. Specifically denies any illicit drug use.  I discussed all relevant lab findings and imaging results with pt and they verbalized understanding. Discussed f/u with PCP within 48 hrs and return precautions, pt very amenable to plan.  Final diagnoses:  Sleeping difficulty        Joycie Peek, PA-C 11/08/14 0900  Lavera Guise, MD 11/08/14 [phone]

## 2015-01-03 ENCOUNTER — Encounter (HOSPITAL_COMMUNITY): Payer: Self-pay | Admitting: Family Medicine

## 2015-01-03 ENCOUNTER — Emergency Department (HOSPITAL_COMMUNITY)
Admission: EM | Admit: 2015-01-03 | Discharge: 2015-01-03 | Disposition: A | Discharge status: Home / Self Care | Payer: No Typology Code available for payment source | Attending: Emergency Medicine | Admitting: Emergency Medicine

## 2015-01-03 DIAGNOSIS — Z720 Tobacco use: Secondary | ICD-10-CM | POA: Insufficient documentation

## 2015-01-03 DIAGNOSIS — Z880 Allergy status to penicillin: Secondary | ICD-10-CM | POA: Insufficient documentation

## 2015-01-03 DIAGNOSIS — R109 Unspecified abdominal pain: Secondary | ICD-10-CM

## 2015-01-03 DIAGNOSIS — R112 Nausea with vomiting, unspecified: Secondary | ICD-10-CM | POA: Insufficient documentation

## 2015-01-03 DIAGNOSIS — R1084 Generalized abdominal pain: Secondary | ICD-10-CM | POA: Insufficient documentation

## 2015-01-03 DIAGNOSIS — Z8719 Personal history of other diseases of the digestive system: Secondary | ICD-10-CM | POA: Insufficient documentation

## 2015-01-03 DIAGNOSIS — Z7951 Long term (current) use of inhaled steroids: Secondary | ICD-10-CM | POA: Insufficient documentation

## 2015-01-03 DIAGNOSIS — R110 Nausea: Secondary | ICD-10-CM

## 2015-01-03 LAB — CBC WITH DIFFERENTIAL/PLATELET
Basophils Absolute: 0 10*3/uL (ref 0.0–0.1)
Basophils Relative: 0 %
Eosinophils Absolute: 0.3 10*3/uL (ref 0.0–0.7)
Eosinophils Relative: 3 %
HCT: 41.8 % (ref 39.0–52.0)
HEMOGLOBIN: 14.5 g/dL (ref 13.0–17.0)
LYMPHS PCT: 28 %
Lymphs Abs: 2.6 10*3/uL (ref 0.7–4.0)
MCH: 31 pg (ref 26.0–34.0)
MCHC: 34.7 g/dL (ref 30.0–36.0)
MCV: 89.5 fL (ref 78.0–100.0)
Monocytes Absolute: 0.8 10*3/uL (ref 0.1–1.0)
Monocytes Relative: 9 %
NEUTROS PCT: 60 %
Neutro Abs: 5.6 10*3/uL (ref 1.7–7.7)
Platelets: 241 10*3/uL (ref 150–400)
RBC: 4.67 MIL/uL (ref 4.22–5.81)
RDW: 14.1 % (ref 11.5–15.5)
WBC: 9.2 10*3/uL (ref 4.0–10.5)

## 2015-01-03 LAB — COMPREHENSIVE METABOLIC PANEL
ALT: 56 U/L (ref 17–63)
ANION GAP: 7 (ref 5–15)
AST: 37 U/L (ref 15–41)
Albumin: 3.7 g/dL (ref 3.5–5.0)
Alkaline Phosphatase: 87 U/L (ref 38–126)
BILIRUBIN TOTAL: 0.4 mg/dL (ref 0.3–1.2)
BUN: 10 mg/dL (ref 6–20)
CO2: 25 mmol/L (ref 22–32)
Calcium: 9.5 mg/dL (ref 8.9–10.3)
Chloride: 107 mmol/L (ref 101–111)
Creatinine, Ser: 0.94 mg/dL (ref 0.61–1.24)
GFR calc Af Amer: >60 mL/min (ref >60)
Glucose, Bld: 109 mg/dL — ABNORMAL HIGH (ref 65–99)
Potassium: 4.2 mmol/L (ref 3.5–5.1)
Sodium: 139 mmol/L (ref 135–145)
TOTAL PROTEIN: 6.8 g/dL (ref 6.5–8.1)

## 2015-01-03 LAB — LIPASE, BLOOD: LIPASE: 28 U/L (ref 22–51)

## 2015-01-03 MED ORDER — ONDANSETRON 4 MG PO TBDP
8.0000 mg | ORAL_TABLET | Freq: Once | ORAL | Status: AC
  Administered 2015-01-03: 8 mg via ORAL
  Filled 2015-01-03: qty 2

## 2015-01-03 MED ORDER — GI COCKTAIL ~~LOC~~
30.0000 mL | Freq: Once | ORAL | Status: AC
  Administered 2015-01-03: 30 mL via ORAL
  Filled 2015-01-03: qty 30

## 2015-01-03 MED ORDER — HYDROCODONE-ACETAMINOPHEN 5-325 MG PO TABS: 1.0000 | ORAL_TABLET | Freq: Four times a day (QID) | ORAL | Status: DC | PRN

## 2015-01-03 MED ORDER — ONDANSETRON HCL 4 MG PO TABS: 4.0000 mg | ORAL_TABLET | Freq: Four times a day (QID) | ORAL | Status: DC

## 2015-01-03 MED ORDER — ACETAMINOPHEN 500 MG PO TABS
1000.0000 mg | ORAL_TABLET | Freq: Once | ORAL | Status: AC
  Administered 2015-01-03: 1000 mg via ORAL
  Filled 2015-01-03: qty 2

## 2015-01-03 MED ORDER — MORPHINE SULFATE (PF) 4 MG/ML IV SOLN
4.0000 mg | Freq: Once | INTRAVENOUS | Status: DC
  Filled 2015-01-03: qty 1

## 2015-01-03 MED ORDER — ONDANSETRON HCL 4 MG/2ML IJ SOLN
4.0000 mg | Freq: Once | INTRAMUSCULAR | Status: DC
  Filled 2015-01-03: qty 2

## 2015-01-03 NOTE — ED Notes (Signed)
PA Browning at bedside. 

## 2015-01-03 NOTE — ED Provider Notes (Signed)
CSN: [id number]     Arrival date & time 01/03/15  4098 History   First MD Initiated Contact with Patient 01/03/15 [phone]     Chief Complaint  Patient presents with  . Abdominal Pain  . Nausea     (Consider location/radiation/quality/duration/timing/severity/associated sxs/prior Treatment) HPI Comments: Patient presents to the emergency department with chief complaint of abdominal pain. Patient states that his symptoms started yesterday after he ate at [employer]. He reports that he has had associated nausea and vomiting. He denies any diarrhea. He states that his abdomen "hurts all over". He denies any fevers", or chills. Denies any chest pain, shortness of breath, dysuria, or other symptoms. He denies any recent sick contacts. He has not taken anything to alleviate his symptoms. There are no aggravating factors. He denies any prior abdominal surgeries.  The history is provided by the patient. No language interpreter was used.    Past Medical History  Diagnosis Date  . Gastritis    History reviewed. No pertinent past surgical history. History reviewed. No pertinent family history. Social History  Substance Use Topics  . Smoking status: Current Some Day Smoker    Types: Cigarettes  . Smokeless tobacco: None  . Alcohol Use: Yes     Comment: occasion    Review of Systems  Constitutional: Negative for fever and chills.  Respiratory: Negative for shortness of breath.   Cardiovascular: Negative for chest pain.  Gastrointestinal: Positive for nausea, vomiting and abdominal pain. Negative for diarrhea and constipation.  Genitourinary: Negative for dysuria.  All other systems reviewed and are negative.     Allergies  Penicillins  Home Medications   Prior to Admission medications   Medication Sig Start Date End Date Taking? Authorizing Provider  dextromethorphan (DELSYM) 30 MG/5ML liquid Take 5 mLs (30 mg total) by mouth as needed for cough. Patient not taking: Reported on  05/22/2014 03/31/14   Garlon Hatchet, PA-C  fluticasone [facility]) 50 MCG/ACT nasal spray Place 2 sprays into both nostrils daily. Patient not taking: Reported on 03/31/2014 11/17/13   Mercedes Camprubi-Soms, PA-C  Melatonin 3 MG CAPS Take 1 capsule (3 mg total) by mouth at bedtime. 11/08/14   Joycie Peek, PA-C  ondansetron (ZOFRAN ODT) 4 MG disintegrating tablet  ODT q4 hours prn nausea/vomit Patient not taking: Reported on 05/22/2014 05/01/14   Dahlia Client Muthersbaugh, PA-C  oxyCODONE-acetaminophen (PERCOCET) 5-325 MG per tablet Take 1-2 tablets by mouth every 4 (four) hours as needed. Patient not taking: Reported on 05/22/2014 05/01/14   Dahlia Client Muthersbaugh, PA-C   BP 135/73 mmHg  Pulse 61  Temp(Src) 97.9 F (36.6 C)  Resp 18  SpO2 95% Physical Exam  Constitutional: He is oriented to person, place, and time. He appears well-developed and well-nourished.  HENT:  Head: Normocephalic and atraumatic.  Eyes: Conjunctivae and EOM are normal. Pupils are equal, round, and reactive to light. Right eye exhibits no discharge. Left eye exhibits no discharge. No scleral icterus.  Neck: Normal range of motion. Neck supple. No JVD present.  Cardiovascular: Normal rate, regular rhythm and normal heart sounds.  Exam reveals no gallop and no friction rub.   No murmur heard. Pulmonary/Chest: Effort normal and breath sounds normal. No respiratory distress. He has no wheezes. He has no rales. He exhibits no tenderness.  Abdominal: Soft. He exhibits no distension and no mass. There is no tenderness. There is no rebound and no guarding.  Abdomen diffusely uncomfortable, but no focal abdominal tenderness, no RLQ tenderness or pain at McBurney's point, no  RUQ tenderness or Murphy's sign, no left-sided abdominal tenderness, no fluid wave, or signs of peritonitis   Musculoskeletal: Normal range of motion. He exhibits no edema or tenderness.  Neurological: He is alert and oriented to person, place, and time.  Skin: Skin  is warm and dry.  Psychiatric: He has a normal mood and affect. His behavior is normal. Judgment and thought content normal.  Nursing note and vitals reviewed.   ED Course  Procedures (including critical care time) Labs Review Labs Reviewed - No data to display  Results for orders placed or performed during the hospital encounter of 01/03/15  CBC with Differential/Platelet  Result Value Ref Range   WBC 9.2 4.0 - 10.5 K/uL   RBC 4.67 4.22 - 5.81 MIL/uL   Hemoglobin 14.5 13.0 - 17.0 g/dL   HCT 16.141.8 09.639.0 - 04.552.0 %   MCV 89.5 78.0 - 100.0 fL   MCH 31.0 26.0 - 34.0 pg   MCHC 34.7 30.0 - 36.0 g/dL   RDW 40.914.1 81.111.5 - 91.415.5 %   Platelets 241 150 - 400 K/uL   Neutrophils Relative % 60 %   Neutro Abs 5.6 1.7 - 7.7 K/uL   Lymphocytes Relative 28 %   Lymphs Abs 2.6 0.7 - 4.0 K/uL   Monocytes Relative 9 %   Monocytes Absolute 0.8 0.1 - 1.0 K/uL   Eosinophils Relative 3 %   Eosinophils Absolute 0.3 0.0 - 0.7 K/uL   Basophils Relative 0 %   Basophils Absolute 0.0 0.0 - 0.1 K/uL  Comprehensive metabolic panel  Result Value Ref Range   Sodium 139 135 - 145 mmol/L   Potassium 4.2 3.5 - 5.1 mmol/L   Chloride 107 101 - 111 mmol/L   CO2 25 22 - 32 mmol/L   Glucose, Bld 109 (H) 65 - 99 mg/dL   BUN 10 6 - 20 mg/dL   Creatinine, Ser 7.820.94 0.61 - 1.24 mg/dL   Calcium 9.5 8.9 - 95.610.3 mg/dL   Total Protein 6.8 6.5 - 8.1 g/dL   Albumin 3.7 3.5 - 5.0 g/dL   AST 37 15 - 41 U/L   ALT 56 17 - 63 U/L   Alkaline Phosphatase 87 38 - 126 U/L   Total Bilirubin 0.4 0.3 - 1.2 mg/dL   GFR calc non Af Amer >60 >60 mL/min   GFR calc Af Amer >60 >60 mL/min   Anion gap 7 5 - 15  Lipase, blood  Result Value Ref Range   Lipase 28 22 - 51 U/L   No results found.    MDM   Final diagnoses:  Abdominal pain, unspecified abdominal location  Nausea   Patient with diffuse cramp abdominal pain.  Suspicious for gastritis, but will check labs while managing symptoms.  Anticipate DC to home.  VSS.  9:32  AM Patient reassessed, he states that he still has some crampy abdominal pain. He has not had any vomiting. His vital signs are stable. Laboratory studies are reassuring. No leukocytosis, no elevated LFTs or lipase. Abdomen has been reassessed several times, and remains soft and nontender to palpation. He does state that is still feels crampy. At this time, I do not feel that additional workup is indicated, but feel that watchful waiting is appropriate. I've discussed specific return precautions with the patient and will have the patient return tomorrow for reassessment. He understands that he is to return if he has new or worsening symptoms or localizing pain. He is agreeable with plan for discharge.  Roxy Horseman, PA-C 01/03/15 8469  Linwood Dibbles, MD 01/04/15 1225

## 2015-01-03 NOTE — Discharge Instructions (Signed)
You have been diagnosed with abdominal pain.  It is important that you return to the ER for worsening pain or new symptoms. I recommend that you return tomorrow for a recheck. Specifically localizing pain, or pain in one specific area of your abdomen, fever, persistent vomiting, or bloody vomit or diarrhea.  Please return for reassessment.  Today your laboratory studies are normal.  Please take you medications as directed.  Abdominal Pain, Adult Many things can cause abdominal pain. Usually, abdominal pain is not caused by a disease and will improve without treatment. It can often be observed and treated at home. Your health care provider will do a physical exam and possibly order blood tests and X-rays to help determine the seriousness of your pain. However, in many cases, more time must pass before a clear cause of the pain can be found. Before that point, your health care provider may not know if you need more testing or further treatment. HOME CARE INSTRUCTIONS Monitor your abdominal pain for any changes. The following actions may help to alleviate any discomfort you are experiencing:  Only take over-the-counter or prescription medicines as directed by your health care provider.  Do not take laxatives unless directed to do so by your health care provider.  Try a clear liquid diet (broth, tea, or water) as directed by your health care provider. Slowly move to a bland diet as tolerated. SEEK MEDICAL CARE IF:  You have unexplained abdominal pain.  You have abdominal pain associated with nausea or diarrhea.  You have pain when you urinate or have a bowel movement.  You experience abdominal pain that wakes you in the night.  You have abdominal pain that is worsened or improved by eating food.  You have abdominal pain that is worsened with eating fatty foods.  You have a fever. SEEK IMMEDIATE MEDICAL CARE IF:  Your pain does not go away within 2 hours.  You keep throwing up  (vomiting).  Your pain is felt only in portions of the abdomen, such as the right side or the left lower portion of the abdomen.  You pass bloody or black tarry stools. MAKE SURE YOU:  Understand these instructions.  Will watch your condition.  Will get help right away if you are not doing well or get worse.   This information is not intended to replace advice given to you by your health care provider. Make sure you discuss any questions you have with your health care provider.   Document Released: 12/13/2004 Document Revised: 11/24/2014 Document Reviewed: 11/12/2012 Elsevier Interactive Patient Education [employer]2016 [employer] Inc.

## 2015-01-03 NOTE — ED Notes (Signed)
Pt here for abd pain and nausea since eating [employer] yesterday. sts vomited x1.

## 2015-01-04 ENCOUNTER — Emergency Department (HOSPITAL_COMMUNITY)
Admission: EM | Admit: 2015-01-04 | Discharge: 2015-01-04 | Disposition: A | Discharge status: Home / Self Care | Payer: No Typology Code available for payment source | Attending: Emergency Medicine | Admitting: Emergency Medicine

## 2015-01-04 ENCOUNTER — Encounter (HOSPITAL_COMMUNITY): Payer: Self-pay | Admitting: Emergency Medicine

## 2015-01-04 DIAGNOSIS — Z880 Allergy status to penicillin: Secondary | ICD-10-CM | POA: Insufficient documentation

## 2015-01-04 DIAGNOSIS — Z720 Tobacco use: Secondary | ICD-10-CM | POA: Insufficient documentation

## 2015-01-04 DIAGNOSIS — K2970 Gastritis, unspecified, without bleeding: Secondary | ICD-10-CM

## 2015-01-04 DIAGNOSIS — Z79899 Other long term (current) drug therapy: Secondary | ICD-10-CM | POA: Insufficient documentation

## 2015-01-04 MED ORDER — GI COCKTAIL ~~LOC~~
30.0000 mL | Freq: Once | ORAL | Status: AC
  Administered 2015-01-04: 30 mL via ORAL
  Filled 2015-01-04: qty 30

## 2015-01-04 MED ORDER — MORPHINE SULFATE (PF) 4 MG/ML IV SOLN
4.0000 mg | Freq: Once | INTRAVENOUS | Status: AC
  Administered 2015-01-04: 4 mg via INTRAVENOUS
  Filled 2015-01-04: qty 1

## 2015-01-04 MED ORDER — RANITIDINE HCL 150 MG PO CAPS: 150.0000 mg | ORAL_CAPSULE | Freq: Every day | ORAL | Status: DC

## 2015-01-04 MED ORDER — PANTOPRAZOLE SODIUM 40 MG IV SOLR
40.0000 mg | Freq: Once | INTRAVENOUS | Status: AC
  Administered 2015-01-04: 40 mg via INTRAVENOUS
  Filled 2015-01-04: qty 40

## 2015-01-04 NOTE — ED Notes (Signed)
Per pt, [facility] for abdominal pain related to possible food poisoning. Pt states his pain/condition has not improved since being seen yesterday. LBM was today, denies diarrhea, positive for nausea, denies vomiting.

## 2015-01-04 NOTE — Discharge Instructions (Signed)
Gastritis, Adult Gastritis is soreness and puffiness (inflammation) of the lining of the stomach. If you do not get help, gastritis can cause bleeding and sores (ulcers) in the stomach. HOME CARE   Only take medicine as told by your doctor.  If you were given antibiotic medicines, take them as told. Finish the medicines even if you start to feel better.  Drink enough fluids to keep your pee (urine) clear or pale yellow.  Avoid foods and drinks that make your problems worse. Foods you may want to avoid include:  Caffeine or alcohol.  Chocolate.  Mint.  Garlic and onions.  Spicy foods.  Citrus fruits, including oranges, lemons, or limes.  Food containing tomatoes, including sauce, chili, salsa, and pizza.  Fried and fatty foods.  Eat small meals throughout the day instead of large meals. GET HELP RIGHT AWAY IF:   You have black or dark red poop (stools).  You throw up (vomit) blood. It may look like coffee grounds.  You cannot keep fluids down.  Your belly (abdominal) pain gets worse.  You have a fever.  You do not feel better after 1 week.  You have any other questions or concerns. MAKE SURE YOU:   Understand these instructions.  Will watch your condition.  Will get help right away if you are not doing well or get worse.   This information is not intended to replace advice given to you by your health care provider. Make sure you discuss any questions you have with your health care provider.  FOLLOW UP WITH PCP IF SYMPTOMS DO NOT IMPROVE. EAT A LIQUID/BLAND FOOD DIET UNTIL SYMPTOMS RESOLVE. RETURN TO THE EMERGENCY DEPARTMENT IF YOU EXPERIENCE WORSENING OF YOUR SYMPTOMS, FEVER, VOMITING.

## 2015-01-05 NOTE — ED Provider Notes (Signed)
CSN: [id number]     Arrival date & time 01/04/15  1026 History   First MD Initiated Contact with Patient 01/04/15 1030     Chief Complaint  Patient presents with  . Abdominal Pain     (Consider location/radiation/quality/duration/timing/severity/associated sxs/prior Treatment) HPI   Manuel Silva is a 29 y.o M with no significant pmhx who presents to the ED today to be reevaluated for abdominal pain from possible food poisoning. Pt states that he was seen ED yesterday for same, d/c home on zofran and told to return today for reassessment. Pt states that his pain has improved since yesterday and pt is no longer nauseous. Pain is described as crampy "like my stomach is in knots". This pain came on 2 days ago after eating a [employer] cheeseburger. Denies vomiting, diarrhea, melena, hematochezia.  Past Medical History  Diagnosis Date  . Gastritis    History reviewed. No pertinent past surgical history. History reviewed. No pertinent family history. Social History  Substance Use Topics  . Smoking status: Current Some Day Smoker    Types: Cigarettes  . Smokeless tobacco: None  . Alcohol Use: Yes     Comment: occasion    Review of Systems  All other systems reviewed and are negative.     Allergies  Penicillins  Home Medications   Prior to Admission medications   Medication Sig Start Date End Date Taking? Authorizing Provider  HYDROcodone-acetaminophen (NORCO/VICODIN) 5-325 MG tablet Take 1-2 tablets by mouth every 6 (six) hours as needed. 01/03/15   Roxy Horsemanobert Browning, PA-C  ondansetron (ZOFRAN) 4 MG tablet Take 1 tablet (4 mg total) by mouth every 6 (six) hours. 01/03/15   Roxy Horsemanobert Browning, PA-C  ranitidine (ZANTAC) 150 MG capsule Take 1 capsule (150 mg total) by mouth daily. 01/04/15   Jahmez Bily Tripp Consuella Scurlock, PA-C   BP 130/96 mmHg  Pulse 52  Temp(Src) 97.8 F (36.6 C) (Oral)  Resp 17  Ht 5\' 9"  (1.753 m)  Wt 198 lb (89.812 kg)  BMI 29.23 kg/m2  SpO2 96% Physical  Exam  Constitutional: He is oriented to person, place, and time. He appears well-developed and well-nourished. No distress.  HENT:  Head: Normocephalic and atraumatic.  Mouth/Throat: No oropharyngeal exudate.  Eyes: Conjunctivae and EOM are normal. Pupils are equal, round, and reactive to light. Right eye exhibits no discharge. Left eye exhibits no discharge. No scleral icterus.  Cardiovascular: Normal rate, regular rhythm, normal heart sounds and intact distal pulses.  Exam reveals no gallop and no friction rub.   No murmur heard. Pulmonary/Chest: Effort normal and breath sounds normal. No respiratory distress. He has no wheezes. He has no rales. He exhibits no tenderness.  Abdominal: Soft. He exhibits no distension and no mass. There is tenderness ( mild TTP of epigastrium). There is no rebound and no guarding.  Musculoskeletal: Normal range of motion. He exhibits no edema.  Neurological: He is alert and oriented to person, place, and time. No cranial nerve deficit.  Strength 5/5 throughout. No sensory deficits.  No gait abnormality.   Skin: Skin is warm and dry. No rash noted. He is not diaphoretic. No erythema. No pallor.  Psychiatric: He has a normal mood and affect. His behavior is normal.  Nursing note and vitals reviewed.   ED Course  Procedures (including critical care time) Labs Review Labs Reviewed - No data to display  Imaging Review No results found. I have personally reviewed and evaluated these images and lab results as part of my medical decision-making.  EKG Interpretation None      MDM   Final diagnoses:  Gastritis   Pt presents for re-evaluation of abdominal cramping after possible food poisoning 2 days ago. Seen and evaluated yesterday at ED where all labs were wnl. Pt told to return for reassessment today. Pt states his symptoms are improving. Denies N/V, melena hematochezia. Will not repeat labs today as they were normal yesterday and pt symptoms are  improving. Pt given pain medication, protonix.   Suspect this is gastritis secondary to food poisoning. Suspect pt symptoms will continue to improve. Pt given return precautions that are outlined in pt discharge instructions. Pt able to tolerate PO.     Lester Kinsman Boronda, PA-C 01/05/15 1752  Blane Ohara, MD 01/07/15 [phone]

## 2015-01-15 ENCOUNTER — Encounter (HOSPITAL_COMMUNITY): Payer: Self-pay | Admitting: Emergency Medicine

## 2015-01-15 ENCOUNTER — Emergency Department (HOSPITAL_COMMUNITY)
Admission: EM | Admit: 2015-01-15 | Discharge: 2015-01-15 | Disposition: A | Discharge status: Home / Self Care | Payer: No Typology Code available for payment source | Attending: Emergency Medicine | Admitting: Emergency Medicine

## 2015-01-15 DIAGNOSIS — R05 Cough: Secondary | ICD-10-CM | POA: Insufficient documentation

## 2015-01-15 DIAGNOSIS — Z880 Allergy status to penicillin: Secondary | ICD-10-CM | POA: Insufficient documentation

## 2015-01-15 DIAGNOSIS — K2970 Gastritis, unspecified, without bleeding: Secondary | ICD-10-CM | POA: Insufficient documentation

## 2015-01-15 DIAGNOSIS — K529 Noninfective gastroenteritis and colitis, unspecified: Secondary | ICD-10-CM

## 2015-01-15 DIAGNOSIS — Z79899 Other long term (current) drug therapy: Secondary | ICD-10-CM | POA: Insufficient documentation

## 2015-01-15 DIAGNOSIS — Z720 Tobacco use: Secondary | ICD-10-CM | POA: Insufficient documentation

## 2015-01-15 LAB — COMPREHENSIVE METABOLIC PANEL
ALBUMIN: 4 g/dL (ref 3.5–5.0)
ALT: 30 U/L (ref 17–63)
ANION GAP: 9 (ref 5–15)
AST: 25 U/L (ref 15–41)
Alkaline Phosphatase: 83 U/L (ref 38–126)
BUN: 14 mg/dL (ref 6–20)
CALCIUM: 9.4 mg/dL (ref 8.9–10.3)
CO2: 24 mmol/L (ref 22–32)
Chloride: 100 mmol/L — ABNORMAL LOW (ref 101–111)
Creatinine, Ser: 0.93 mg/dL (ref 0.61–1.24)
GFR calc Af Amer: >60 mL/min (ref >60)
GFR calc non Af Amer: >60 mL/min (ref >60)
GLUCOSE: 106 mg/dL — AB (ref 65–99)
POTASSIUM: 3.9 mmol/L (ref 3.5–5.1)
SODIUM: 133 mmol/L — AB (ref 135–145)
Total Bilirubin: 0.3 mg/dL (ref 0.3–1.2)
Total Protein: 7.2 g/dL (ref 6.5–8.1)

## 2015-01-15 LAB — URINALYSIS, ROUTINE W REFLEX MICROSCOPIC
BILIRUBIN URINE: NEGATIVE
GLUCOSE, UA: NEGATIVE mg/dL
HGB URINE DIPSTICK: NEGATIVE
Ketones, ur: NEGATIVE mg/dL
Leukocytes, UA: NEGATIVE
Nitrite: NEGATIVE
PH: 7 (ref 5.0–8.0)
Protein, ur: NEGATIVE mg/dL
SPECIFIC GRAVITY, URINE: 1.023 (ref 1.005–1.030)
UROBILINOGEN UA: 2 mg/dL — AB (ref 0.0–1.0)

## 2015-01-15 LAB — CBC
HEMATOCRIT: 41.8 % (ref 39.0–52.0)
HEMOGLOBIN: 14.7 g/dL (ref 13.0–17.0)
MCH: 31.1 pg (ref 26.0–34.0)
MCHC: 35.2 g/dL (ref 30.0–36.0)
MCV: 88.4 fL (ref 78.0–100.0)
Platelets: 286 10*3/uL (ref 150–400)
RBC: 4.73 MIL/uL (ref 4.22–5.81)
RDW: 13.4 % (ref 11.5–15.5)
WBC: 10.2 10*3/uL (ref 4.0–10.5)

## 2015-01-15 LAB — LIPASE, BLOOD: Lipase: 32 U/L (ref 11–51)

## 2015-01-15 MED ORDER — ONDANSETRON 8 MG PO TBDP: ORAL_TABLET | ORAL | Status: DC

## 2015-01-15 NOTE — Discharge Instructions (Signed)
Zofran as prescribed as needed for nausea.  Return to the ER if you develop severe abdominal pain, high fever, bloody stool, or other new and concerning symptoms.

## 2015-01-15 NOTE — ED Notes (Signed)
Pt. reports mid abdominal pain with emesis and diarrhea onset yesterday , denies fever or chills , pt. stated his son was diagnosed with viral illness last week .

## 2015-01-15 NOTE — ED Provider Notes (Signed)
CSN: [id number]     Arrival date & time 01/15/15  0133 History  By signing my name below, I, Arianna Nassar, attest that this documentation has been prepared under the direction and in the presence of Geoffery Lyonsouglas Keeghan Bialy, MD. Electronically Signed: Octavia HeirArianna Nassar, ED Scribe. 01/15/2015. 2:42 AM.    Chief Complaint  Patient presents with  . Abdominal Pain      The history is provided by the patient. No language interpreter was used.   HPI Comments: Manuel MootsChristopher A Silva is a 29 y.o. male who presents to the Emergency Department complaining of intermittent, gradual worsening generalized abdominal pain onset yesterday. Pt has associated vomiting, diarrhea, nausea and cough. He states that his son had a stomach bug last week and he is unknown if that is the cause for his symptoms. Pt denies difficulty urinating and bloody stool.  Past Medical History  Diagnosis Date  . Gastritis    History reviewed. No pertinent past surgical history. No family history on file. Social History  Substance Use Topics  . Smoking status: Current Some Day Smoker    Types: Cigarettes  . Smokeless tobacco: None  . Alcohol Use: Yes     Comment: occasion    Review of Systems  Respiratory: Positive for cough.   Gastrointestinal: Positive for vomiting, abdominal pain and diarrhea. Negative for blood in stool.  Genitourinary: Negative for difficulty urinating.  All other systems reviewed and are negative.     Allergies  Penicillins  Home Medications   Prior to Admission medications   Medication Sig Start Date End Date Taking? Authorizing Provider  HYDROcodone-acetaminophen (NORCO/VICODIN) 5-325 MG tablet Take 1-2 tablets by mouth every 6 (six) hours as needed. 01/03/15   Roxy Horsemanobert Browning, PA-C  ondansetron (ZOFRAN) 4 MG tablet Take 1 tablet (4 mg total) by mouth every 6 (six) hours. 01/03/15   Roxy Horsemanobert Browning, PA-C  ranitidine (ZANTAC) 150 MG capsule Take 1 capsule (150 mg total) by mouth daily. 01/04/15    Samantha Tripp Dowless, PA-C   Triage vitals: BP 132/60 mmHg  Pulse 54  Temp(Src) 97.6 F (36.4 C) (Oral)  Resp 16  SpO2 93% Physical Exam  Constitutional: He is oriented to person, place, and time. He appears well-developed and well-nourished.  HENT:  Head: Normocephalic and atraumatic.  Eyes: EOM are normal.  Neck: Normal range of motion.  Cardiovascular: Normal rate, regular rhythm, normal heart sounds and intact distal pulses.   Pulmonary/Chest: Effort normal and breath sounds normal. No respiratory distress.  Abdominal: Soft. He exhibits no distension. There is tenderness.  Mild tenderness in periumbilical region  Musculoskeletal: Normal range of motion.  Neurological: He is alert and oriented to person, place, and time.  Skin: Skin is warm and dry.  Psychiatric: He has a normal mood and affect. Judgment normal.  Nursing note and vitals reviewed.   ED Course  Procedures  DIAGNOSTIC STUDIES: Oxygen Saturation is 93% on RA, normal by my interpretation.  COORDINATION OF CARE:  2:39 AM Discussed treatment plan which includes nausea medication, keep hydrated with pt at bedside and pt agreed to plan.  Labs Review Labs Reviewed  COMPREHENSIVE METABOLIC PANEL - Abnormal; Notable for the following:    Sodium 133 (*)    Chloride 100 (*)    Glucose, Bld 106 (*)    All other components within normal limits  URINALYSIS, ROUTINE W REFLEX MICROSCOPIC (NOT [facility]) - Abnormal; Notable for the following:    Urobilinogen, UA 2.0 (*)    All other components within  normal limits  LIPASE, BLOOD  CBC    Imaging Review No results found. I have personally reviewed and evaluated these images and lab results as part of my medical decision-making.   EKG Interpretation None      MDM   Final diagnoses:  None    Patient presents with nausea, vomiting, and diarrhea since yesterday. His presentation, workup, and exam are all consistent with a viral gastroenteritis. He will be  discharged with Zofran and when necessary return.  I personally performed the services described in this documentation, which was scribed in my presence. The recorded information has been reviewed and is accurate.      Geoffery Lyons, MD 01/16/15 [phone]

## 2015-02-03 ENCOUNTER — Emergency Department (HOSPITAL_COMMUNITY): Payer: Managed Care, Other (non HMO)

## 2015-02-03 ENCOUNTER — Encounter (HOSPITAL_COMMUNITY): Payer: Self-pay | Admitting: Emergency Medicine

## 2015-02-03 ENCOUNTER — Emergency Department (HOSPITAL_COMMUNITY)
Admission: EM | Admit: 2015-02-03 | Discharge: 2015-02-03 | Disposition: A | Discharge status: Home / Self Care | Payer: Managed Care, Other (non HMO) | Attending: Emergency Medicine | Admitting: Emergency Medicine

## 2015-02-03 DIAGNOSIS — S3992XA Unspecified injury of lower back, initial encounter: Secondary | ICD-10-CM | POA: Diagnosis present

## 2015-02-03 DIAGNOSIS — Y9289 Other specified places as the place of occurrence of the external cause: Secondary | ICD-10-CM | POA: Insufficient documentation

## 2015-02-03 DIAGNOSIS — S29002A Unspecified injury of muscle and tendon of back wall of thorax, initial encounter: Secondary | ICD-10-CM | POA: Insufficient documentation

## 2015-02-03 DIAGNOSIS — Z79899 Other long term (current) drug therapy: Secondary | ICD-10-CM | POA: Insufficient documentation

## 2015-02-03 DIAGNOSIS — Z880 Allergy status to penicillin: Secondary | ICD-10-CM | POA: Diagnosis not present

## 2015-02-03 DIAGNOSIS — F17210 Nicotine dependence, cigarettes, uncomplicated: Secondary | ICD-10-CM | POA: Diagnosis not present

## 2015-02-03 DIAGNOSIS — Z8719 Personal history of other diseases of the digestive system: Secondary | ICD-10-CM | POA: Diagnosis not present

## 2015-02-03 DIAGNOSIS — Y9389 Activity, other specified: Secondary | ICD-10-CM | POA: Diagnosis not present

## 2015-02-03 DIAGNOSIS — Y998 Other external cause status: Secondary | ICD-10-CM | POA: Diagnosis not present

## 2015-02-03 DIAGNOSIS — M549 Dorsalgia, unspecified: Secondary | ICD-10-CM

## 2015-02-03 DIAGNOSIS — M62830 Muscle spasm of back: Secondary | ICD-10-CM | POA: Insufficient documentation

## 2015-02-03 MED ORDER — HYDROCODONE-ACETAMINOPHEN 5-325 MG PO TABS
1.0000 | ORAL_TABLET | Freq: Once | ORAL | Status: AC
  Administered 2015-02-03: 1 via ORAL
  Filled 2015-02-03: qty 1

## 2015-02-03 MED ORDER — NAPROXEN 500 MG PO TABS: 500.0000 mg | ORAL_TABLET | Freq: Two times a day (BID) | ORAL | Status: DC | PRN

## 2015-02-03 MED ORDER — CYCLOBENZAPRINE HCL 10 MG PO TABS: 10.0000 mg | ORAL_TABLET | Freq: Three times a day (TID) | ORAL | Status: DC | PRN

## 2015-02-03 MED ORDER — HYDROCODONE-ACETAMINOPHEN 5-325 MG PO TABS: 1.0000 | ORAL_TABLET | Freq: Four times a day (QID) | ORAL | Status: DC | PRN
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
## 2015-02-03 NOTE — Discharge Instructions (Signed)
Back Pain:  Your back pain should be treated with medicines such as ibuprofen or aleve and this back pain should get better over the next 2 weeks.  However if you develop severe or worsening pain, low back pain with fever, numbness, weakness or inability to walk or urinate, you should return to the ER immediately.  Please follow up with your doctor this week for a recheck if still having symptoms.  Avoid heavy lifting over 10 pounds over the next two weeks.  Low back pain is discomfort in the lower back that may be due to injuries to muscles and ligaments around the spine.  Occasionally, it may be caused by a a problem to a part of the spine called a disc.  The pain may last several days or a week;  However, most patients get completely well in 4 weeks.  Self - care:  The application of heat can help soothe the pain.  Maintaining your daily activities, including walking, is encourged, as it will help you get better faster than just staying in bed. Perform gentle stretching as discussed. Drink plenty of fluids.  Medications are also useful to help with pain control.  A commonly prescribed medication includes norco.  Do not drive or operate heavy machinery while taking this medication.  Non steroidal anti inflammatory medications including Ibuprofen and naproxen;  These medications help both pain and swelling and are very useful in treating back pain.  They should be taken with food, as they can cause stomach upset, and more seriously, stomach bleeding.    Muscle relaxants:  These medications can help with muscle tightness that is a cause of lower back pain.  Most of these medications can cause drowsiness, and it is not safe to drive or use dangerous machinery while taking them.  SEEK IMMEDIATE MEDICAL ATTENTION IF: New numbness, tingling, weakness, or problem with the use of your arms or legs.  Severe back pain not relieved with medications.  Difficulty with or loss of control of your bowel or  bladder control.  Increasing pain in any areas of the body (such as chest or abdominal pain).  Shortness of breath, dizziness or fainting.  Nausea (feeling sick to your stomach), vomiting, fever, or sweats.  You will need to follow up with  Your primary healthcare provider in 1-2 weeks for reassessment.   Back Pain, Adult Back pain is very common. The pain often gets better over time. The cause of back pain is usually not dangerous. Most people can learn to manage their back pain on their own.  HOME CARE  Watch your back pain for any changes. The following actions may help to lessen any pain you are feeling:  Stay active. Start with short walks on flat ground if you can. Try to walk farther each day.  Exercise regularly as told by your doctor. Exercise helps your back heal faster. It also helps avoid future injury by keeping your muscles strong and flexible.  Do not sit, drive, or stand in one place for more than 30 minutes.  Do not stay in bed. Resting more than 1-2 days can slow down your recovery.  Be careful when you bend or lift an object. Use good form when lifting:  Bend at your knees.  Keep the object close to your body.  Do not twist.  Sleep on a firm mattress. Lie on your side, and bend your knees. If you lie on your back, put a pillow under your knees.  Take medicines  only as told by your doctor.  Put ice on the injured area.  Put ice in a plastic bag.  Place a towel between your skin and the bag.  Leave the ice on for 20 minutes, 2-3 times a day for the first 2-3 days. After that, you can switch between ice and heat packs.  Avoid feeling anxious or stressed. Find good ways to deal with stress, such as exercise.  Maintain a healthy weight. Extra weight puts stress on your back. GET HELP IF:   You have pain that does not go away with rest or medicine.  You have worsening pain that goes down into your legs or buttocks.  You have pain that does not get better  in one week.  You have pain at night.  You lose weight.  You have a fever or chills. GET HELP RIGHT AWAY IF:   You cannot control when you poop (bowel movement) or pee (urinate).  Your arms or legs feel weak.  Your arms or legs lose feeling (numbness).  You feel sick to your stomach (nauseous) or throw up (vomit).  You have belly (abdominal) pain.  You feel like you may pass out (faint).   This information is not intended to replace advice given to you by your health care provider. Make sure you discuss any questions you have with your health care provider.   Document Released: 08/22/2007 Document Revised: 03/26/2014 Document Reviewed: 07/07/2013 Elsevier Interactive Patient Education 2016 [place] Injury Prevention Back injuries can be very painful. They can also be difficult to heal. After having one back injury, you are more likely to injure your back again. It is important to learn how to avoid injuring or re-injuring your back. The following tips can help you to prevent a back injury. WHAT SHOULD I KNOW ABOUT PHYSICAL FITNESS?  Exercise for 30 minutes per day on most days of the week or as told by your doctor. Make sure to:  Do aerobic exercises, such as walking, jogging, biking, or swimming.  Do exercises that increase balance and strength, such as tai chi and yoga.  Do stretching exercises. This helps with flexibility.  Try to develop strong belly (abdominal) muscles. Your belly muscles help to support your back.  Stay at a healthy weight. This helps to decrease your risk of a back injury. WHAT SHOULD I KNOW ABOUT MY DIET?  Talk with your doctor about your overall diet. Take supplements and vitamins only as told by your doctor.  Talk with your doctor about how much calcium and vitamin D you need each day. These nutrients help to prevent weakening of the bones (osteoporosis).  Include good sources of calcium in your diet, such as:  Dairy  products.  Green leafy vegetables.  Products that have had calcium added to them (fortified).  Include good sources of vitamin D in your diet, such as:  Milk.  Foods that have had vitamin D added to them. WHAT SHOULD I KNOW ABOUT MY POSTURE?  Sit up straight and stand up straight. Avoid leaning forward when you sit or hunching over when you stand.  Choose chairs that have good low-back (lumbar) support.  If you work at a desk, sit close to it so you do not need to lean over. Keep your chin tucked in. Keep your neck drawn back. Keep your elbows bent so your arms look like the letter "L" (right angle).  Sit high and close to the steering wheel when you drive. Add  a low-back support to your car seat, if needed.  Avoid sitting or standing in one position for very long. Take breaks to get up, stretch, and walk around at least one time every hour. Take breaks every hour if you are driving for long periods of time.  Sleep on your side with your knees slightly bent, or sleep on your back with a pillow under your knees. Do not lie on the front of your body to sleep. WHAT SHOULD I KNOW ABOUT LIFTING, TWISTING, AND REACHING Lifting and Heavy Lifting  Avoid heavy lifting, especially lifting over and over again. If you must do heavy lifting:  Stretch before lifting.  Work slowly.  Rest between lifts.  Use a tool such as a cart or a dolly to move objects if one is available.  Make several small trips instead of carrying one heavy load.  Ask for help when you need it, especially when moving big objects.  Follow these steps when lifting:  Stand with your feet shoulder-width apart.  Get as close to the object as you can. Do not pick up a heavy object that is far from your body.  Use handles or lifting straps if they are available.  Bend at your knees. Squat down, but keep your heels off the floor.  Keep your shoulders back. Keep your chin tucked in. Keep your back straight.  Lift  the object slowly while you tighten the muscles in your legs, belly, and butt. Keep the object as close to the center of your body as possible.  Follow these steps when putting down a heavy load:  Stand with your feet shoulder-width apart.  Lower the object slowly while you tighten the muscles in your legs, belly, and butt. Keep the object as close to the center of your body as possible.  Keep your shoulders back. Keep your chin tucked in. Keep your back straight.  Bend at your knees. Squat down, but keep your heels off the floor.  Use handles or lifting straps if they are available. Twisting and Reaching 1. Avoid lifting heavy objects above your waist. 2. Do not twist at your waist while you are lifting or carrying a load. If you need to turn, move your feet. 3. Do not bend over without bending at your knees. 4. Avoid reaching over your head, across a table, or for an object on a high surface.  WHAT ARE SOME OTHER TIPS? 1. Avoid wet floors and icy ground. Keep sidewalks clear of ice to prevent falls.  2. Do not sleep on a mattress that is too soft or too hard.  3. Keep items that you use often within easy reach.  4. Put heavier objects on shelves at waist level, and put lighter objects on lower or higher shelves. 5. Find ways to lower your stress, such as: 1. Exercise. 2. Massage. 3. Relaxation techniques. 6. Talk with your doctor if you feel anxious or depressed. These conditions can make back pain worse. 7. Wear flat heel shoes with cushioned soles. 8. Avoid making quick (sudden) movements. 9. Use both shoulder straps when carrying a backpack. 10. Do not use any tobacco products, including cigarettes, chewing tobacco, or electronic cigarettes. If you need help quitting, ask your doctor.   This information is not intended to replace advice given to you by your health care provider. Make sure you discuss any questions you have with your health care provider.   Document  Released: 08/22/2007 Document Revised: 07/20/2014 Document Reviewed: 03/09/2014 Elsevier  Interactive Patient Education 2016 [place].  Back Exercises If you have pain in your back, do these exercises 2-3 times each day or as told by your doctor. When the pain goes away, do the exercises once each day, but repeat the steps more times for each exercise (do more repetitions). If you do not have pain in your back, do these exercises once each day or as told by your doctor. EXERCISES Single Knee to Chest Do these steps 3-5 times in a row for each leg:  Lie on your back on a firm bed or the floor with your legs stretched out.  Bring one knee to your chest.  Hold your knee to your chest by grabbing your knee or thigh.  Pull on your knee until you feel a gentle stretch in your lower back.  Keep doing the stretch for 10-30 seconds.  Slowly let go of your leg and straighten it. Pelvic Tilt Do these steps 5-10 times in a row:  Lie on your back on a firm bed or the floor with your legs stretched out.  Bend your knees so they point up to the ceiling. Your feet should be flat on the floor.  Tighten your lower belly (abdomen) muscles to press your lower back against the floor. This will make your tailbone point up to the ceiling instead of pointing down to your feet or the floor.  Stay in this position for 5-10 seconds while you gently tighten your muscles and breathe evenly. Cat-Cow Do these steps until your lower back bends more easily:  Get on your hands and knees on a firm surface. Keep your hands under your shoulders, and keep your knees under your hips. You may put padding under your knees.  Let your head hang down, and make your tailbone point down to the floor so your lower back is round like the back of a cat.  Stay in this position for 5 seconds.  Slowly lift your head and make your tailbone point up to the ceiling so your back hangs low (sags) like the back of a cow.  Stay  in this position for 5 seconds. Press-Ups Do these steps 5-10 times in a row:  Lie on your belly (face-down) on the floor.  Place your hands near your head, about shoulder-width apart.  While you keep your back relaxed and keep your hips on the floor, slowly straighten your arms to raise the top half of your body and lift your shoulders. Do not use your back muscles. To make yourself more comfortable, you may change where you place your hands.  Stay in this position for 5 seconds.  Slowly return to lying flat on the floor. Bridges Do these steps 10 times in a row: 5. Lie on your back on a firm surface. 6. Bend your knees so they point up to the ceiling. Your feet should be flat on the floor. 7. Tighten your butt muscles and lift your butt off of the floor until your waist is almost as high as your knees. If you do not feel the muscles working in your butt and the back of your thighs, slide your feet 1-2 inches farther away from your butt. 8. Stay in this position for 3-5 seconds. 9. Slowly lower your butt to the floor, and let your butt muscles relax. If this exercise is too easy, try doing it with your arms crossed over your chest. Belly Crunches Do these steps 5-10 times in a row: 11. Lie  on your back on a firm bed or the floor with your legs stretched out. 12. Bend your knees so they point up to the ceiling. Your feet should be flat on the floor. 66. Cross your arms over your chest. 14. Tip your chin a little bit toward your chest but do not bend your neck. 42. Tighten your belly muscles and slowly raise your chest just enough to lift your shoulder blades a tiny bit off of the floor. 16. Slowly lower your chest and your head to the floor. Back Lifts Do these steps 5-10 times in a row: 1. Lie on your belly (face-down) with your arms at your sides, and rest your forehead on the floor. 2. Tighten the muscles in your legs and your butt. 3. Slowly lift your chest off of the floor while  you keep your hips on the floor. Keep the back of your head in line with the curve in your back. Look at the floor while you do this. 4. Stay in this position for 3-5 seconds. 5. Slowly lower your chest and your face to the floor. GET HELP IF:  Your back pain gets a lot worse when you do an exercise.  Your back pain does not lessen 2 hours after you exercise. If you have any of these problems, stop doing the exercises. Do not do them again unless your doctor says it is okay. GET HELP RIGHT AWAY IF:  You have sudden, very bad back pain. If this happens, stop doing the exercises. Do not do them again unless your doctor says it is okay.   This information is not intended to replace advice given to you by your health care provider. Make sure you discuss any questions you have with your health care provider.   Document Released: 04/07/2010 Document Revised: 11/24/2014 Document Reviewed: 04/29/2014 Elsevier Interactive Patient Education 2016 [place] therapy can help ease sore, stiff, injured, and tight muscles and joints. Heat relaxes your muscles, which may help ease your pain. Heat therapy should only be used on old, pre-existing, or long-lasting (chronic) injuries. Do not use heat therapy unless told by your doctor. HOW TO USE HEAT THERAPY There are several different kinds of heat therapy, including:  Moist heat pack.  Warm water bath.  Hot water bottle.  Electric heating pad.  Heated gel pack.  Heated wrap.  Electric heating pad. GENERAL HEAT THERAPY RECOMMENDATIONS   Do not sleep while using heat therapy. Only use heat therapy while you are awake.  Your skin may turn pink while using heat therapy. Do not use heat therapy if your skin turns red.  Do not use heat therapy if you have new pain.  High heat or long exposure to heat can cause burns. Be careful when using heat therapy to avoid burning your skin.  Do not use heat therapy on areas of your  skin that are already irritated, such as with a rash or sunburn. GET HELP IF:   You have blisters, redness, swelling (puffiness), or numbness.  You have new pain.  Your pain is worse. MAKE SURE YOU:  Understand these instructions.  Will watch your condition.  Will get help right away if you are not doing well or get worse.   This information is not intended to replace advice given to you by your health care provider. Make sure you discuss any questions you have with your health care provider.   Document Released: 05/28/2011 Document Revised: 03/26/2014 Document Reviewed:  04/28/2013 Elsevier Interactive Patient Education 2016 [employer]  Muscle Cramps and Spasms Muscle cramps and spasms are when muscles tighten by themselves. They usually get better within minutes. Muscle cramps are painful. They are usually stronger and last longer than muscle spasms. Muscle spasms may or may not be painful. They can last a few seconds or much longer. HOME CARE  Drink enough fluid to keep your pee (urine) clear or pale yellow.  Massage, stretch, and relax the muscle.  Use a warm towel, heating pad, or warm shower water on tight muscles.  Place ice on the muscle if it is tender or in pain.  Put ice in a plastic bag.  Place a towel between your skin and the bag.  Leave the ice on for 15-20 minutes, 03-04 times a day.  Only take medicine as told by your doctor. GET HELP RIGHT AWAY IF:  Your cramps or spasms get worse, happen more often, or do not get better with time. MAKE SURE YOU:  Understand these instructions.  Will watch your condition.  Will get help right away if you are not doing well or get worse.   This information is not intended to replace advice given to you by your health care provider. Make sure you discuss any questions you have with your health care provider.   Document Released: 02/16/2008 Document Revised: 06/30/2012 Document Reviewed: 02/20/2012 Elsevier  Interactive Patient Education [employer].
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
## 2015-02-17 ENCOUNTER — Emergency Department (HOSPITAL_COMMUNITY): Payer: Managed Care, Other (non HMO)

## 2015-02-17 ENCOUNTER — Encounter (HOSPITAL_COMMUNITY): Payer: Self-pay | Admitting: Neurology

## 2015-02-17 ENCOUNTER — Emergency Department (HOSPITAL_COMMUNITY)
Admission: EM | Admit: 2015-02-17 | Discharge: 2015-02-17 | Disposition: A | Discharge status: Home / Self Care | Payer: Managed Care, Other (non HMO) | Attending: Emergency Medicine | Admitting: Emergency Medicine

## 2015-02-17 DIAGNOSIS — S0990XA Unspecified injury of head, initial encounter: Secondary | ICD-10-CM | POA: Diagnosis not present

## 2015-02-17 DIAGNOSIS — M25532 Pain in left wrist: Secondary | ICD-10-CM

## 2015-02-17 DIAGNOSIS — Y9389 Activity, other specified: Secondary | ICD-10-CM | POA: Insufficient documentation

## 2015-02-17 DIAGNOSIS — S99912A Unspecified injury of left ankle, initial encounter: Secondary | ICD-10-CM | POA: Diagnosis not present

## 2015-02-17 DIAGNOSIS — Y998 Other external cause status: Secondary | ICD-10-CM | POA: Insufficient documentation

## 2015-02-17 DIAGNOSIS — S299XXA Unspecified injury of thorax, initial encounter: Secondary | ICD-10-CM | POA: Insufficient documentation

## 2015-02-17 DIAGNOSIS — S8992XA Unspecified injury of left lower leg, initial encounter: Secondary | ICD-10-CM | POA: Diagnosis not present

## 2015-02-17 DIAGNOSIS — F17210 Nicotine dependence, cigarettes, uncomplicated: Secondary | ICD-10-CM | POA: Diagnosis not present

## 2015-02-17 DIAGNOSIS — Z880 Allergy status to penicillin: Secondary | ICD-10-CM | POA: Insufficient documentation

## 2015-02-17 DIAGNOSIS — M25572 Pain in left ankle and joints of left foot: Secondary | ICD-10-CM

## 2015-02-17 DIAGNOSIS — Y92410 Unspecified street and highway as the place of occurrence of the external cause: Secondary | ICD-10-CM | POA: Insufficient documentation

## 2015-02-17 DIAGNOSIS — S6992XA Unspecified injury of left wrist, hand and finger(s), initial encounter: Secondary | ICD-10-CM | POA: Insufficient documentation

## 2015-02-17 DIAGNOSIS — S199XXA Unspecified injury of neck, initial encounter: Secondary | ICD-10-CM | POA: Insufficient documentation

## 2015-02-17 DIAGNOSIS — M25562 Pain in left knee: Secondary | ICD-10-CM

## 2015-02-17 DIAGNOSIS — M542 Cervicalgia: Secondary | ICD-10-CM

## 2015-02-17 MED ORDER — METHOCARBAMOL 500 MG PO TABS: 500.0000 mg | ORAL_TABLET | Freq: Two times a day (BID) | ORAL | Status: DC

## 2015-02-17 MED ORDER — IBUPROFEN 400 MG PO TABS
800.0000 mg | ORAL_TABLET | Freq: Once | ORAL | Status: AC
  Administered 2015-02-17: 800 mg via ORAL
  Filled 2015-02-17: qty 2

## 2015-02-17 MED ORDER — NAPROXEN 500 MG PO TABS: 500.0000 mg | ORAL_TABLET | Freq: Two times a day (BID) | ORAL | Status: DC

## 2015-02-17 NOTE — ED Notes (Signed)
[employer] at bedside 
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
## 2015-12-29 ENCOUNTER — Emergency Department (HOSPITAL_COMMUNITY)
Admission: EM | Admit: 2015-12-29 | Discharge: 2015-12-29 | Disposition: A | Discharge status: Home / Self Care | Payer: No Typology Code available for payment source | Attending: Emergency Medicine | Admitting: Emergency Medicine

## 2015-12-29 ENCOUNTER — Encounter (HOSPITAL_COMMUNITY): Payer: Self-pay | Admitting: Emergency Medicine

## 2015-12-29 DIAGNOSIS — R1084 Generalized abdominal pain: Secondary | ICD-10-CM

## 2015-12-29 DIAGNOSIS — F17210 Nicotine dependence, cigarettes, uncomplicated: Secondary | ICD-10-CM | POA: Insufficient documentation

## 2015-12-29 LAB — CBC
HEMATOCRIT: 41.6 % (ref 39.0–52.0)
HEMOGLOBIN: 14.3 g/dL (ref 13.0–17.0)
MCH: 30.8 pg (ref 26.0–34.0)
MCHC: 34.4 g/dL (ref 30.0–36.0)
MCV: 89.7 fL (ref 78.0–100.0)
Platelets: 249 10*3/uL (ref 150–400)
RBC: 4.64 MIL/uL (ref 4.22–5.81)
RDW: 13.8 % (ref 11.5–15.5)
WBC: 8.1 10*3/uL (ref 4.0–10.5)

## 2015-12-29 LAB — COMPREHENSIVE METABOLIC PANEL
ALBUMIN: 4.2 g/dL (ref 3.5–5.0)
ALT: 37 U/L (ref 17–63)
AST: 26 U/L (ref 15–41)
Alkaline Phosphatase: 77 U/L (ref 38–126)
Anion gap: 7 (ref 5–15)
BILIRUBIN TOTAL: 0.5 mg/dL (ref 0.3–1.2)
BUN: 11 mg/dL (ref 6–20)
CHLORIDE: 102 mmol/L (ref 101–111)
CO2: 27 mmol/L (ref 22–32)
Calcium: 9.4 mg/dL (ref 8.9–10.3)
Creatinine, Ser: 1.18 mg/dL (ref 0.61–1.24)
GFR calc Af Amer: >60 mL/min (ref >60)
GFR calc non Af Amer: >60 mL/min (ref >60)
GLUCOSE: 88 mg/dL (ref 65–99)
POTASSIUM: 4.1 mmol/L (ref 3.5–5.1)
SODIUM: 136 mmol/L (ref 135–145)
Total Protein: 7.1 g/dL (ref 6.5–8.1)

## 2015-12-29 LAB — URINALYSIS, ROUTINE W REFLEX MICROSCOPIC
BILIRUBIN URINE: NEGATIVE
GLUCOSE, UA: NEGATIVE mg/dL
Hgb urine dipstick: NEGATIVE
KETONES UR: NEGATIVE mg/dL
Leukocytes, UA: NEGATIVE
Nitrite: NEGATIVE
PH: 8.5 — AB (ref 5.0–8.0)
Protein, ur: NEGATIVE mg/dL
SPECIFIC GRAVITY, URINE: 1.021 (ref 1.005–1.030)

## 2015-12-29 LAB — LIPASE, BLOOD: Lipase: 24 U/L (ref 11–51)

## 2015-12-29 MED ORDER — ONDANSETRON HCL 4 MG/2ML IJ SOLN
4.0000 mg | Freq: Once | INTRAMUSCULAR | Status: AC
  Administered 2015-12-29: 4 mg via INTRAVENOUS
  Filled 2015-12-29: qty 2

## 2015-12-29 MED ORDER — KETOROLAC TROMETHAMINE 30 MG/ML IJ SOLN
30.0000 mg | Freq: Once | INTRAMUSCULAR | Status: AC
  Administered 2015-12-29: 30 mg via INTRAVENOUS
  Filled 2015-12-29: qty 1

## 2015-12-29 MED ORDER — PROMETHAZINE HCL 25 MG PO TABS: 25.0000 mg | ORAL_TABLET | Freq: Four times a day (QID) | ORAL | 0 refills | Status: DC | PRN

## 2015-12-29 MED ORDER — DICYCLOMINE HCL 20 MG PO TABS: 20.0000 mg | ORAL_TABLET | Freq: Two times a day (BID) | ORAL | 0 refills | Status: DC | PRN

## 2015-12-29 NOTE — Discharge Instructions (Signed)
Take Bentyl as needed for abdominal pain and Phenergan for nausea or vomiting. Your blood work was reassuring today. It is possible that your symptoms may be due to a viral illness. We advised the return to the Emergency Department if you experience worsening abdominal pain, fever, or uncontrolled vomiting. Follow-up with a primary care doctor regarding your visit today.

## 2015-12-29 NOTE — ED Notes (Signed)
Made pts he would rather wait and see a MD before he has blood draw done.

## 2015-12-29 NOTE — ED Provider Notes (Signed)
[facility]-EMERGENCY DEPT Provider Note   CSN: [id number] Arrival date & time: 12/29/15  1814     History   Chief Complaint Chief Complaint  Patient presents with  . Abdominal Pain    HPI Manuel Silva is a 30 y.o. male.  30 year old male with history of gastritis presents to the emergency department for evaluation of abdominal pain. Patient states that he began having symptoms yesterday. He reports generalized abdominal discomfort with some associated nausea. He denies taking any medications for his symptoms. He noted a sharper pain earlier this morning, but reports that pain has mostly been aching. It is nonradiating. Symptoms are not time to eating. He denies any sick contacts. No associated fevers, vomiting, diarrhea, melena or hematochezia, urinary symptoms. No history of abdominal surgeries.   The history is provided by the patient. No language interpreter was used.  Abdominal Pain      Past Medical History:  Diagnosis Date  . Gastritis     There are no active problems to display for this patient.   History reviewed. No pertinent surgical history.    Home Medications    Prior to Admission medications   Medication Sig Start Date End Date Taking? Authorizing Provider  dicyclomine (BENTYL) 20 MG tablet Take 1 tablet (20 mg total) by mouth 2 (two) times daily as needed (abdominal pain/cramping). 12/29/15   Antony MaduraKelly Kaia Depaolis, PA-C  promethazine (PHENERGAN) 25 MG tablet Take 1 tablet (25 mg total) by mouth every 6 (six) hours as needed for nausea or vomiting. 12/29/15   Antony MaduraKelly Taniesha Glanz, PA-C    Family History No family history on file.  Social History Social History  Substance Use Topics  . Smoking status: Current Some Day Smoker    Types: Cigarettes  . Smokeless tobacco: Not on file  . Alcohol use Yes     Comment: occasion     Allergies   Penicillins   Review of Systems Review of Systems  Gastrointestinal: Positive for abdominal pain.  Ten systems reviewed  and are negative for acute change, except as noted in the HPI.    Physical Exam Updated Vital Signs BP 129/85   Pulse (!) 45   Temp 97.8 F (36.6 C) (Oral)   Resp 16   Ht 5\' 9"  (1.753 m)   Wt 94.3 kg   SpO2 98%   BMI 30.72 kg/m   Physical Exam  Constitutional: He is oriented to person, place, and time. He appears well-developed and well-nourished. No distress.  Nontoxic appearing and in no distress  HENT:  Head: Normocephalic and atraumatic.  Eyes: Conjunctivae and EOM are normal. No scleral icterus.  Neck: Normal range of motion.  Cardiovascular: Normal rate, regular rhythm and intact distal pulses.   Pulmonary/Chest: Effort normal. No respiratory distress. He has no wheezes.  Respirations even and unlabored. Lungs clear bilaterally.  Abdominal: Soft. He exhibits no distension and no mass. There is tenderness. There is no guarding.  Mild generalized tenderness to palpation. No focal tenderness. Negative Murphy sign. No masses or peritoneal signs. No voluntary guarding.  Musculoskeletal: Normal range of motion.  Neurological: He is alert and oriented to person, place, and time. He exhibits normal muscle tone. Coordination normal.  Skin: Skin is warm and dry. No rash noted. He is not diaphoretic. No erythema. No pallor.  Psychiatric: He has a normal mood and affect. His behavior is normal.  Nursing note and vitals reviewed.    ED Treatments / Results  Labs (all labs ordered are listed, but only  abnormal results are displayed) Labs Reviewed  URINALYSIS, ROUTINE W REFLEX MICROSCOPIC (NOT AT [facility]) - Abnormal; Notable for the following:       Result Value   pH 8.5 (*)    All other components within normal limits  LIPASE, BLOOD  COMPREHENSIVE METABOLIC PANEL  CBC    EKG  EKG Interpretation None       Radiology No results found.  Procedures Procedures (including critical care time)  Medications Ordered in ED Medications  ketorolac (TORADOL) 30 MG/ML injection  30 mg (30 mg Intravenous Given 12/29/15 2126)  ondansetron (ZOFRAN) injection 4 mg (4 mg Intravenous Given 12/29/15 2126)     Initial Impression / Assessment and Plan / ED Course  I have reviewed the triage vital signs and the nursing notes.  Pertinent labs & imaging results that were available during my care of the patient were reviewed by me and considered in my medical decision making (see chart for details).  Clinical Course    Patient presenting for vague, generalized abdominal pain x 2 days. Suspect food-borne illness vs viral etiology. Vitals are stable, no fever. No focal abdominal pain and exam is improved on repeat abdominal exam. Doubt appendicitis given lack of TTP at McBurney's point, vomiting, and fever. Negative Murphy's sign and normal LFTs; doubt cholecystitis. Doubt pSBO or SBO given mild nature of symptoms and lack of emesis. Further doubt ruptured viscous, kidney stones, diverticulitis. No evidence of UTI today. Lipase WNL.  Patient reports improvement in pain with Toradol. I believe he is stable for further management on an outpatient basis. Will prescribe Bentyl and Phenergan. Return precautions discussed and provided. Patient discharged in stable condition with no unaddressed concerns.   Final Clinical Impressions(s) / ED Diagnoses   Final diagnoses:  Generalized abdominal pain    New Prescriptions New Prescriptions   DICYCLOMINE (BENTYL) 20 MG TABLET    Take 1 tablet (20 mg total) by mouth 2 (two) times daily as needed (abdominal pain/cramping).   PROMETHAZINE (PHENERGAN) 25 MG TABLET    Take 1 tablet (25 mg total) by mouth every 6 (six) hours as needed for nausea or vomiting.     Antony Madura, PA-C 12/29/15 2232    Azalia Bilis, MD 12/30/15 [phone]

## 2015-12-29 NOTE — ED Triage Notes (Signed)
Pt states since last night he has had generalized abd pain. Pt denies any n/v/d.

## 2015-12-29 NOTE — ED Notes (Signed)
Pt verbalized understanding of d/c instructions and has no further questions. Pt stable and NAD.  

## 2016-07-13 ENCOUNTER — Encounter (HOSPITAL_COMMUNITY): Payer: Self-pay

## 2016-07-13 DIAGNOSIS — R197 Diarrhea, unspecified: Secondary | ICD-10-CM | POA: Insufficient documentation

## 2016-07-13 DIAGNOSIS — R112 Nausea with vomiting, unspecified: Secondary | ICD-10-CM | POA: Insufficient documentation

## 2016-07-13 DIAGNOSIS — F17210 Nicotine dependence, cigarettes, uncomplicated: Secondary | ICD-10-CM | POA: Insufficient documentation

## 2016-07-13 NOTE — ED Triage Notes (Signed)
Pt complaining of N/V x 2 days. Pt states 2 episodes of emesis today. Pt denies any diarrhea. Pt states son had similar, believes got it from him. Pt denies any fevers.

## 2016-07-14 ENCOUNTER — Emergency Department (HOSPITAL_COMMUNITY)
Admission: EM | Admit: 2016-07-14 | Discharge: 2016-07-14 | Disposition: A | Discharge status: Home / Self Care | Payer: No Typology Code available for payment source | Attending: Emergency Medicine | Admitting: Emergency Medicine

## 2016-07-14 DIAGNOSIS — R197 Diarrhea, unspecified: Secondary | ICD-10-CM

## 2016-07-14 DIAGNOSIS — R112 Nausea with vomiting, unspecified: Secondary | ICD-10-CM

## 2016-07-14 LAB — URINALYSIS, ROUTINE W REFLEX MICROSCOPIC
Bilirubin Urine: NEGATIVE
Glucose, UA: NEGATIVE mg/dL
HGB URINE DIPSTICK: NEGATIVE
Ketones, ur: NEGATIVE mg/dL
Leukocytes, UA: NEGATIVE
Nitrite: NEGATIVE
PROTEIN: NEGATIVE mg/dL
Specific Gravity, Urine: 1.02 (ref 1.005–1.030)
pH: 7 (ref 5.0–8.0)

## 2016-07-14 LAB — CBC
HCT: 41.1 % (ref 39.0–52.0)
Hemoglobin: 14.1 g/dL (ref 13.0–17.0)
MCH: 30.6 pg (ref 26.0–34.0)
MCHC: 34.3 g/dL (ref 30.0–36.0)
MCV: 89.2 fL (ref 78.0–100.0)
PLATELETS: 234 10*3/uL (ref 150–400)
RBC: 4.61 MIL/uL (ref 4.22–5.81)
RDW: 13.7 % (ref 11.5–15.5)
WBC: 10 10*3/uL (ref 4.0–10.5)

## 2016-07-14 LAB — COMPREHENSIVE METABOLIC PANEL
ALBUMIN: 4 g/dL (ref 3.5–5.0)
ALK PHOS: 67 U/L (ref 38–126)
ALT: 41 U/L (ref 17–63)
AST: 29 U/L (ref 15–41)
Anion gap: 10 (ref 5–15)
BUN: 11 mg/dL (ref 6–20)
CALCIUM: 9.4 mg/dL (ref 8.9–10.3)
CHLORIDE: 106 mmol/L (ref 101–111)
CO2: 24 mmol/L (ref 22–32)
CREATININE: 1.16 mg/dL (ref 0.61–1.24)
GFR calc Af Amer: >60 mL/min (ref >60)
GFR calc non Af Amer: >60 mL/min (ref >60)
GLUCOSE: 100 mg/dL — AB (ref 65–99)
Potassium: 3.8 mmol/L (ref 3.5–5.1)
SODIUM: 140 mmol/L (ref 135–145)
Total Bilirubin: 0.4 mg/dL (ref 0.3–1.2)
Total Protein: 6.7 g/dL (ref 6.5–8.1)

## 2016-07-14 LAB — LIPASE, BLOOD: LIPASE: 25 U/L (ref 11–51)

## 2016-07-14 MED ORDER — ONDANSETRON 4 MG PO TBDP: 4.0000 mg | ORAL_TABLET | Freq: Three times a day (TID) | ORAL | 0 refills | Status: DC | PRN

## 2016-07-14 MED ORDER — ONDANSETRON HCL 4 MG/2ML IJ SOLN
4.0000 mg | Freq: Once | INTRAMUSCULAR | Status: AC
  Administered 2016-07-14: 4 mg via INTRAVENOUS
  Filled 2016-07-14: qty 2

## 2016-07-14 MED ORDER — DICYCLOMINE HCL 20 MG PO TABS: 20.0000 mg | ORAL_TABLET | Freq: Two times a day (BID) | ORAL | 0 refills | Status: DC

## 2016-07-14 MED ORDER — FAMOTIDINE 20 MG PO TABS
20.0000 mg | ORAL_TABLET | Freq: Once | ORAL | Status: AC
  Administered 2016-07-14: 20 mg via ORAL
  Filled 2016-07-14: qty 1

## 2016-07-14 NOTE — ED Notes (Signed)
PT states understanding of care given, follow up care, and medication prescribed. PT ambulated from ED to car with a steady gait. 

## 2016-07-14 NOTE — Discharge Instructions (Signed)
Please read and follow all provided instructions.  Your diagnoses today include:  1. Nausea vomiting and diarrhea     Tests performed today include:  Blood counts and electrolytes  Blood tests to check liver and kidney function  Blood tests to check pancreas function  Urine test to look for infection and pregnancy (in women)  Vital signs. See below for your results today.   Medications prescribed:   Zofran (ondansetron) - for nausea and vomiting   Bentyl - medication for intestinal cramps and spasms  Take any prescribed medications only as directed.  Home care instructions:   Follow any educational materials contained in this packet.   Your abdominal pain, nausea, vomiting, and diarrhea may be caused by a viral gastroenteritis also called 'stomach flu'. You should rest for the next several days. Keep drinking plenty of fluids and use the medicine for nausea as directed.    Drink clear liquids for the next 24 hours and introduce solid foods slowly after 24 hours using the b.r.a.t. diet (Bananas, Rice, Applesauce, Toast, Yogurt).    Follow-up instructions: Please follow-up with your primary care provider in the next 2 days for further evaluation of your symptoms. If you are not feeling better in 48 hours you may have a condition that is more serious and you need re-evaluation.   Return instructions:  SEEK IMMEDIATE MEDICAL ATTENTION IF:  If you have pain that does not go away or becomes severe   A temperature above 101F develops   Repeated vomiting occurs (multiple episodes)   If you have pain that becomes localized to portions of the abdomen. The right side could possibly be appendicitis. In an adult, the left lower portion of the abdomen could be colitis or diverticulitis.   Blood is being passed in stools or vomit (bright red or black tarry stools)   You develop chest pain, difficulty breathing, dizziness or fainting, or become confused, poorly responsive, or  inconsolable (young children)  If you have any other emergent concerns regarding your health  Additional Information: Abdominal (belly) pain can be caused by many things. Your caregiver performed an examination and possibly ordered blood/urine tests and imaging (CT scan, x-rays, ultrasound). Many cases can be observed and treated at home after initial evaluation in the emergency department. Even though you are being discharged home, abdominal pain can be unpredictable. Therefore, you need a repeated exam if your pain does not resolve, returns, or worsens. Most patients with abdominal pain don't have to be admitted to the hospital or have surgery, but serious problems like appendicitis and gallbladder attacks can start out as nonspecific pain. Many abdominal conditions cannot be diagnosed in one visit, so follow-up evaluations are very important.  Your vital signs today were: BP 114/76 (BP Location: Right Arm)    Pulse (!) 51    Temp 97.8 F (36.6 C) (Oral)    Resp 16    SpO2 96%  If your blood pressure (bp) was elevated above 135/85 this visit, please have this repeated by your doctor within one month. --------------

## 2016-07-14 NOTE — ED Provider Notes (Signed)
[facility]-EMERGENCY DEPT Provider Note   CSN: [id number] Arrival date & time: 07/13/16  2301     History   Chief Complaint Chief Complaint  Patient presents with  . Abdominal Pain  . Nausea    HPI Manuel Silva is a 31 y.o. male.  Patient with no past surgical history -- presents with complaint of nausea, vomiting, and diarrhea for 1-2 days. Patient reports that his son was recently sick with similar symptoms. Patient complains of generalized abdominal pain. Patient has had loose watery stools without blood. No chest pain or shortness of breath. No treatments prior to arrival. Patient denies heavy NSAID use or alcohol use. He states that he has gradually been feeling better while waiting in the emergency department. Patient last vomited just prior to arrival. The onset of this condition was acute.  Aggravating factors: none. Alleviating factors: none.        Past Medical History:  Diagnosis Date  . Gastritis     There are no active problems to display for this patient.   No past surgical history on file.     Home Medications    Prior to Admission medications   Medication Sig Start Date End Date Taking? Authorizing Provider  dicyclomine (BENTYL) 20 MG tablet Take 1 tablet (20 mg total) by mouth 2 (two) times daily as needed (abdominal pain/cramping). 12/29/15   Antony Madura, PA-C  promethazine (PHENERGAN) 25 MG tablet Take 1 tablet (25 mg total) by mouth every 6 (six) hours as needed for nausea or vomiting. 12/29/15   Antony Madura, PA-C    Family History No family history on file.  Social History Social History  Substance Use Topics  . Smoking status: Current Some Day Smoker    Types: Cigarettes  . Smokeless tobacco: Never Used  . Alcohol use Yes     Comment: occasion     Allergies   Penicillins   Review of Systems Review of Systems  Constitutional: Negative for fever.  HENT: Negative for rhinorrhea and sore throat.   Eyes: Negative for redness.    Respiratory: Negative for cough.   Cardiovascular: Negative for chest pain.  Gastrointestinal: Positive for abdominal pain, diarrhea, nausea and vomiting. Negative for blood in stool.  Genitourinary: Negative for dysuria.  Musculoskeletal: Negative for myalgias.  Skin: Negative for rash.  Neurological: Negative for headaches.     Physical Exam Updated Vital Signs BP 122/75 (BP Location: Right Arm)   Pulse 61   Temp 97.8 F (36.6 C) (Oral)   Resp 18   SpO2 98%   Physical Exam  Constitutional: He appears well-developed and well-nourished.  HENT:  Head: Normocephalic and atraumatic.  Mouth/Throat: Oropharynx is clear and moist.  Eyes: Conjunctivae are normal. Right eye exhibits no discharge. Left eye exhibits no discharge.  Neck: Normal range of motion. Neck supple.  Cardiovascular: Normal rate, regular rhythm and normal heart sounds.   No murmur heard. Pulmonary/Chest: Effort normal and breath sounds normal. No respiratory distress. He has no wheezes. He has no rales.  Abdominal: Soft. There is tenderness (generalized, minimal). There is no rebound and no guarding.  Neurological: He is alert.  Skin: Skin is warm and dry.  Psychiatric: He has a normal mood and affect.  Nursing note and vitals reviewed.    ED Treatments / Results  Labs (all labs ordered are listed, but only abnormal results are displayed) Labs Reviewed  COMPREHENSIVE METABOLIC PANEL - Abnormal; Notable for the following:       Result Value  Glucose, Bld 100 (*)    All other components within normal limits  LIPASE, BLOOD  CBC  URINALYSIS, ROUTINE W REFLEX MICROSCOPIC    Procedures Procedures (including critical care time)  Medications Ordered in ED Medications  ondansetron (ZOFRAN) injection 4 mg (4 mg Intravenous Given 07/14/16 0559)  famotidine (PEPCID) tablet 20 mg (20 mg Oral Given 07/14/16 0559)     Initial Impression / Assessment and Plan / ED Course  I have reviewed the triage vital  signs and the nursing notes.  Pertinent labs & imaging results that were available during my care of the patient were reviewed by me and considered in my medical decision making (see chart for details).     Patient seen and examined. Work-up initiated. Medications ordered.   Vital signs reviewed and are as follows: BP 122/75 (BP Location: Right Arm)   Pulse 61   Temp 97.8 F (36.6 C) (Oral)   Resp 18   SpO2 98%   Will PO challenge. Anticipate discharge to home if improved.  6:52 AM Patient continues to have nausea but no vomiting.   Counseled on clear liquid and brat diet. Discharged home with Zofran and Bentyl.  The patient was urged to return to the Emergency Department immediately with worsening of current symptoms, worsening abdominal pain, persistent vomiting, blood noted in stools, fever, or any other concerns. The patient verbalized understanding.    Final Clinical Impressions(s) / ED Diagnoses   Final diagnoses:  Nausea vomiting and diarrhea   Patient with N/V/D. Labs reassuring. Vitals are stable, no fever. No signs of dehydration, tolerating PO's. Lungs are clear. No focal abdominal pain. Low concern for appendicitis, cholecystitis, pancreatitis, ruptured viscus, UTI, kidney stone, aortic dissection, aortic aneurysm or other emergent abdominal etiology. Supportive therapy indicated with return if symptoms worsen. Patient counseled.   New Prescriptions New Prescriptions   DICYCLOMINE (BENTYL) 20 MG TABLET    Take 1 tablet (20 mg total) by mouth 2 (two) times daily.   ONDANSETRON (ZOFRAN ODT) 4 MG DISINTEGRATING TABLET    Take 1 tablet (4 mg total) by mouth every 8 (eight) hours as needed for nausea or vomiting.     Renne Crigler, PA-C 07/14/16 1610    Pricilla Loveless, MD 07/14/16 2201

## 2016-07-19 DIAGNOSIS — Z5321 Procedure and treatment not carried out due to patient leaving prior to being seen by health care provider: Secondary | ICD-10-CM | POA: Insufficient documentation

## 2016-07-19 DIAGNOSIS — R1084 Generalized abdominal pain: Secondary | ICD-10-CM

## 2016-07-20 ENCOUNTER — Encounter (HOSPITAL_COMMUNITY): Payer: Self-pay | Admitting: *Deleted

## 2016-07-20 ENCOUNTER — Emergency Department (HOSPITAL_COMMUNITY)
Admission: EM | Admit: 2016-07-20 | Discharge: 2016-07-20 | Disposition: A | Discharge status: Home / Self Care | Payer: Managed Care, Other (non HMO) | Source: Home / Self Care

## 2016-07-20 ENCOUNTER — Emergency Department (HOSPITAL_COMMUNITY)
Admission: EM | Admit: 2016-07-20 | Discharge: 2016-07-20 | Disposition: A | Discharge status: Home / Self Care | Payer: Managed Care, Other (non HMO) | Attending: Emergency Medicine | Admitting: Emergency Medicine

## 2016-07-20 DIAGNOSIS — R1013 Epigastric pain: Secondary | ICD-10-CM | POA: Insufficient documentation

## 2016-07-20 DIAGNOSIS — F17210 Nicotine dependence, cigarettes, uncomplicated: Secondary | ICD-10-CM | POA: Insufficient documentation

## 2016-07-20 DIAGNOSIS — Z79899 Other long term (current) drug therapy: Secondary | ICD-10-CM | POA: Insufficient documentation

## 2016-07-20 LAB — URINALYSIS, ROUTINE W REFLEX MICROSCOPIC
BILIRUBIN URINE: NEGATIVE
Glucose, UA: NEGATIVE mg/dL
Hgb urine dipstick: NEGATIVE
KETONES UR: 5 mg/dL — AB
Leukocytes, UA: NEGATIVE
NITRITE: NEGATIVE
PROTEIN: NEGATIVE mg/dL
Specific Gravity, Urine: 1.034 — ABNORMAL HIGH (ref 1.005–1.030)
pH: 5 (ref 5.0–8.0)

## 2016-07-20 LAB — CBC
HEMATOCRIT: 43.3 % (ref 39.0–52.0)
Hemoglobin: 15.2 g/dL (ref 13.0–17.0)
MCH: 31.3 pg (ref 26.0–34.0)
MCHC: 35.1 g/dL (ref 30.0–36.0)
MCV: 89.3 fL (ref 78.0–100.0)
PLATELETS: 253 10*3/uL (ref 150–400)
RBC: 4.85 MIL/uL (ref 4.22–5.81)
RDW: 13.6 % (ref 11.5–15.5)
WBC: 11.2 10*3/uL — AB (ref 4.0–10.5)

## 2016-07-20 LAB — COMPREHENSIVE METABOLIC PANEL
ALK PHOS: 62 U/L (ref 38–126)
ALT: 51 U/L (ref 17–63)
AST: 38 U/L (ref 15–41)
Albumin: 3.7 g/dL (ref 3.5–5.0)
Anion gap: 7 (ref 5–15)
BILIRUBIN TOTAL: 0.5 mg/dL (ref 0.3–1.2)
BUN: 12 mg/dL (ref 6–20)
CALCIUM: 8.9 mg/dL (ref 8.9–10.3)
CO2: 24 mmol/L (ref 22–32)
CREATININE: 1.03 mg/dL (ref 0.61–1.24)
Chloride: 107 mmol/L (ref 101–111)
GFR calc Af Amer: >60 mL/min (ref >60)
Glucose, Bld: 128 mg/dL — ABNORMAL HIGH (ref 65–99)
POTASSIUM: 3.9 mmol/L (ref 3.5–5.1)
Sodium: 138 mmol/L (ref 135–145)
TOTAL PROTEIN: 6.2 g/dL — AB (ref 6.5–8.1)

## 2016-07-20 LAB — LIPASE, BLOOD: Lipase: 23 U/L (ref 11–51)

## 2016-07-20 MED ORDER — PROMETHAZINE HCL 25 MG PO TABS: 25.0000 mg | ORAL_TABLET | Freq: Four times a day (QID) | ORAL | 0 refills | Status: DC | PRN

## 2016-07-20 NOTE — ED Provider Notes (Signed)
[facility]PT Provider Note   CSN: [id number] Arrival date & time: 07/20/16  0112 By signing my name below, I, Manuel Silva, attest that this documentation has been prepared under the direction and in the presence of Geoffery Lyons, MD . Electronically Signed: Levon Silva, Scribe. 07/20/2016. 3:21 AM.   History   Chief Complaint Chief Complaint  Patient presents with  . Abdominal Pain   HPI Manuel Silva is a 31 y.o. male who presents to the Emergency Department complaining of gradually improving, diffuse abdominal pain onset yesterday.  He notes associated nausea and vomiting. No alleviating or modifying factors noted. No alleviating or modifying factors noted. Pt was seen for the same on 07/13/16. Pt reported that his son had similar symptoms and believes that he caught a stomach virus from him. No abdominal SHx. Pt states his symptoms have improved while in the ED and denies any current symptoms at this time. Pt denies any diarrhea and has no other acute complaints at this time.   The history is provided by the patient. No language interpreter was used.   Past Medical History:  Diagnosis Date  . Gastritis    There are no active problems to display for this patient.  History reviewed. No pertinent surgical history.    Home Medications    Prior to Admission medications   Medication Sig Start Date End Date Taking? Authorizing Provider  dicyclomine (BENTYL) 20 MG tablet Take 1 tablet (20 mg total) by mouth 2 (two) times daily. 07/14/16   Renne Crigler, PA-C  ondansetron (ZOFRAN ODT) 4 MG disintegrating tablet Take 1 tablet (4 mg total) by mouth every 8 (eight) hours as needed for nausea or vomiting. 07/14/16   Renne Crigler, PA-C    Family History No family history on file.  Social History Social History  Substance Use Topics  . Smoking status: Current Some Day Smoker    Types: Cigarettes  . Smokeless tobacco: Never Used  . Alcohol use Yes     Comment:  occasion    Allergies   Penicillins   Review of Systems Review of Systems All systems reviewed and are negative for acute change except as noted in the HPI.  Physical Exam Updated Vital Signs BP 119/71   Pulse 69   Temp 97.9 F (36.6 C) (Oral)   Resp 16   SpO2 94%   Physical Exam  Constitutional: He is oriented to person, place, and time. He appears well-developed and well-nourished.  HENT:  Head: Normocephalic and atraumatic.  Eyes: EOM are normal.  Neck: Normal range of motion.  Cardiovascular: Normal rate, regular rhythm, normal heart sounds and intact distal pulses.   Pulmonary/Chest: Effort normal and breath sounds normal. No respiratory distress.  Abdominal: Soft. He exhibits no distension. There is no tenderness.  Musculoskeletal: Normal range of motion.  Neurological: He is alert and oriented to person, place, and time.  Skin: Skin is warm and dry.  Psychiatric: He has a normal mood and affect. Judgment normal.  Nursing note and vitals reviewed.  ED Treatments / Results  DIAGNOSTIC STUDIES:  Oxygen Saturation is 94% on RA, adequate by my interpretation.    COORDINATION OF CARE:  3:20 AM Discussed treatment plan with pt at bedside and pt agreed to plan.   Labs (all labs ordered are listed, but only abnormal results are displayed) Labs Reviewed  URINALYSIS, ROUTINE W REFLEX MICROSCOPIC    EKG  EKG Interpretation None       Radiology No results found.  Procedures Procedures (including critical care time)  Medications Ordered in ED Medications - No data to display   Initial Impression / Assessment and Plan / ED Course  I have reviewed the triage vital signs and the nursing notes.  Pertinent labs & imaging results that were available during my care of the patient were reviewed by me and considered in my medical decision making (see chart for details).  Abdominal exam is benign and the patient is well-hydrated. I see no indication for further  workup. He will be discharged, to return as needed.  Final Clinical Impressions(s) / ED Diagnoses   Final diagnoses:  None    New Prescriptions New Prescriptions   No medications on file   I personally performed the services described in this documentation, which was scribed in my presence. The recorded information has been reviewed and is accurate.        Geoffery Lyonselo, Donell Tomkins, MD 07/23/16 [phone]

## 2016-07-20 NOTE — ED Notes (Signed)
Pt had blood drawn on previous visit tonight, new labs ordered for urinalysis.

## 2016-07-20 NOTE — ED Triage Notes (Signed)
PT says that he was seen here for n/v and abdominal pain about 1 week ago, symptoms improved but last night started having generalized abdominal pain and nausea. No vomiting/diarrhea/fevers. No meds PTA.

## 2016-07-20 NOTE — Discharge Instructions (Signed)
Phenergan as prescribed as needed for nausea.  Return to the emergency department if you develop worsening pain, high fevers, bloody stool or vomit, or other new and concerning symptoms.

## 2016-07-20 NOTE — ED Triage Notes (Signed)
Pt was here and was called in the lobby without response, returned for evaluation for n/v and abdominal pain.

## 2016-07-20 NOTE — ED Notes (Signed)
Pt is aware he needs a urine sample 

## 2016-07-20 NOTE — ED Notes (Signed)
Pt called by EMT for room assignment with no answer

## 2016-08-15 ENCOUNTER — Emergency Department (HOSPITAL_COMMUNITY)
Admission: EM | Admit: 2016-08-15 | Discharge: 2016-08-15 | Disposition: A | Discharge status: Home / Self Care | Payer: Managed Care, Other (non HMO) | Attending: Emergency Medicine | Admitting: Emergency Medicine

## 2016-08-15 ENCOUNTER — Encounter (HOSPITAL_COMMUNITY): Payer: Self-pay

## 2016-08-15 ENCOUNTER — Emergency Department (HOSPITAL_COMMUNITY): Payer: Managed Care, Other (non HMO)

## 2016-08-15 DIAGNOSIS — F17210 Nicotine dependence, cigarettes, uncomplicated: Secondary | ICD-10-CM | POA: Insufficient documentation

## 2016-08-15 DIAGNOSIS — J40 Bronchitis, not specified as acute or chronic: Secondary | ICD-10-CM

## 2016-08-15 DIAGNOSIS — J209 Acute bronchitis, unspecified: Secondary | ICD-10-CM | POA: Insufficient documentation

## 2016-08-15 MED ORDER — AZITHROMYCIN 250 MG PO TABS: 250.0000 mg | ORAL_TABLET | Freq: Every day | ORAL | 0 refills | Status: DC

## 2016-08-15 MED ORDER — BENZONATATE 100 MG PO CAPS: 100.0000 mg | ORAL_CAPSULE | Freq: Three times a day (TID) | ORAL | 0 refills | Status: DC

## 2016-08-15 MED ORDER — ALBUTEROL SULFATE HFA 108 (90 BASE) MCG/ACT IN AERS
2.0000 | INHALATION_SPRAY | RESPIRATORY_TRACT | Status: DC | PRN
  Administered 2016-08-15: 2 via RESPIRATORY_TRACT
  Filled 2016-08-15: qty 6.7

## 2016-08-15 MED ORDER — IPRATROPIUM-ALBUTEROL 0.5-2.5 (3) MG/3ML IN SOLN
3.0000 mL | Freq: Once | RESPIRATORY_TRACT | Status: AC
  Administered 2016-08-15: 3 mL via RESPIRATORY_TRACT
  Filled 2016-08-15: qty 3

## 2016-08-15 NOTE — ED Notes (Signed)
Pt taken to xray 

## 2016-08-15 NOTE — ED Triage Notes (Signed)
Patient complains of resp illness 2 weeks ago with cough and congestion. Now complains of lose of sense of smell and ongoing congestion, NAD

## 2016-08-15 NOTE — ED Provider Notes (Signed)
[facility] Provider Note   CSN: [id number] Arrival date & time: 08/15/16  1605   By signing my name below, I, Soijett Blue, [id number] that this documentation has been prepared under the direction and in the presence of Kerrie BuffaloHope Diella Gillingham, NP Electronically Signed: Soijett Blue, ED Scribe. 08/15/16. 7:13 PM.  History   Chief Complaint No chief complaint on file.   HPI Manuel Silva is a 31 y.o. male who presents to the Emergency Department complaining of possible sinus infection onset 2 weeks ago. Pt reports associated productive cough x yellow sputum, chest tightness, nasal congestion, rhinorrhea, and resolved HA. Pt has not tried any medications for the relief of his symptoms. He notes that he does smoke cigarettes, but hasn't smoked much while being sick. He denies ear pain, sore throat, nausea, vomiting, diarrhea, abdominal pain, back pain, and any other symptoms.  Denies PMHx of asthma, inhaler use, or taking daily medications.    The history is provided by the patient. No language interpreter was used.    Past Medical History:  Diagnosis Date  . Gastritis     There are no active problems to display for this patient.   History reviewed. No pertinent surgical history.     Home Medications    Prior to Admission medications   Medication Sig Start Date End Date Taking? Authorizing Provider  azithromycin (ZITHROMAX) 250 MG tablet Take 1 tablet (250 mg total) by mouth daily. Take first 2 tablets together, then 1 every day until finished. 08/15/16   Janne NapoleonNeese, Adam Demary M, NP  benzonatate (TESSALON) 100 MG capsule Take 1 capsule (100 mg total) by mouth every 8 (eight) hours. 08/15/16   Janne NapoleonNeese, Sandor Arboleda M, NP  dicyclomine (BENTYL) 20 MG tablet Take 1 tablet (20 mg total) by mouth 2 (two) times daily. 07/14/16   Renne CriglerGeiple, Joshua, PA-C  ondansetron (ZOFRAN ODT) 4 MG disintegrating tablet Take 1 tablet (4 mg total) by mouth every 8 (eight) hours as needed for nausea or vomiting. 07/14/16    Renne CriglerGeiple, Joshua, PA-C  promethazine (PHENERGAN) 25 MG tablet Take 1 tablet (25 mg total) by mouth every 6 (six) hours as needed for nausea. 07/20/16   Geoffery Lyonselo, Douglas, MD    Family History No family history on file.  Social History Social History  Substance Use Topics  . Smoking status: Current Some Day Smoker    Types: Cigarettes  . Smokeless tobacco: Never Used  . Alcohol use Yes     Comment: occasion     Allergies   Penicillins   Review of Systems Review of Systems  HENT: Positive for congestion and rhinorrhea. Negative for ear pain and sore throat.   Respiratory: Positive for cough (productive, yellow sputum) and chest tightness.   Gastrointestinal: Negative for abdominal pain, diarrhea, nausea and vomiting.  Musculoskeletal: Negative for back pain.  Neurological: Positive for headaches (resolved).  All other systems reviewed and are negative.    Physical Exam Updated Vital Signs BP 131/88   Pulse 89   Temp 98.5 F (36.9 C) (Oral)   Resp 18   SpO2 98%   Physical Exam  Constitutional: He is oriented to person, place, and time. He appears well-developed and well-nourished. No distress.  HENT:  Head: Normocephalic and atraumatic.  Right Ear: Tympanic membrane and ear canal normal.  Left Ear: Tympanic membrane and ear canal normal.  Nose: Mucosal edema present. Right sinus exhibits maxillary sinus tenderness. Right sinus exhibits no frontal sinus tenderness. Left sinus exhibits maxillary sinus tenderness. Left sinus exhibits  no frontal sinus tenderness.  Mouth/Throat: Uvula is midline and mucous membranes are normal. Posterior oropharyngeal erythema present. No posterior oropharyngeal edema.  Mild posterior oropharynx erythema without edema. Nasal mucosa with erythema and edema.   Eyes: Conjunctivae and EOM are normal. Pupils are equal, round, and reactive to light.  PERRL. EOM intact. Sclera clear.   Neck: Normal range of motion. Neck supple.  FROM of neck without  pain. No meningeal signs.  Cardiovascular: Normal rate and regular rhythm.   Pulmonary/Chest: Effort normal and breath sounds normal.  Abdominal: Soft. There is no tenderness.  Musculoskeletal: Normal range of motion.  Lymphadenopathy:    He has no cervical adenopathy.  Neurological: He is alert and oriented to person, place, and time.  Skin: Skin is warm and dry.  Psychiatric: He has a normal mood and affect. His behavior is normal.  Nursing note and vitals reviewed.    ED Treatments / Results  DIAGNOSTIC STUDIES: Oxygen Saturation is 98% on RA, nl by my interpretation.    COORDINATION OF CARE: 7:12 PM Discussed treatment plan with pt at bedside which includes breathing treatment, CXR, abx Rx, and pt agreed to plan.   Labs (all labs ordered are listed, but only abnormal results are displayed) Labs Reviewed - No data to display   Radiology Dg Chest 2 View  Result Date: 08/15/2016 CLINICAL DATA:  Productive cough and congestion. EXAM: CHEST  2 VIEW COMPARISON:  11/17/2013 FINDINGS: Heart and mediastinum are within normal limits. The lungs appear to be clear. There are questionable densities along the anterior chest on the lateral view but this does not clearly represent airspace disease. No pleural effusions. Trachea is midline. Bone structures are intact. IMPRESSION: Questionable densities along the anterior lower chest on the lateral view. This probably represents overlying structures. No acute abnormality. Electronically Signed   By: Richarda Overlie M.D.   On: 08/15/2016 20:08    Procedures Procedures (including critical care time)  Medications Ordered in ED Medications  ipratropium-albuterol (DUONEB) 0.5-2.5 (3) MG/3ML nebulizer solution 3 mL (3 mLs Nebulization Given 08/15/16 1922)   Patient reports feeling better after neb treatment. Will give albuterol inhaler to go home with patient.   Initial Impression / Assessment and Plan / ED Course  I have reviewed the triage vital  signs and the nursing notes. Pt symptoms consistent with Bronchitis. CXR negative for acute infiltrate. Pt will be discharged with symptomatic treatment.  Discussed return precautions.  Pt is hemodynamically stable & in NAD prior to discharge. Patient encouraged to stop smoking.   Final Clinical Impressions(s) / ED Diagnoses   Final diagnoses:  Bronchitis    New Prescriptions Discharge Medication List as of 08/15/2016  8:26 PM    START taking these medications   Details  azithromycin (ZITHROMAX) 250 MG tablet Take 1 tablet (250 mg total) by mouth daily. Take first 2 tablets together, then 1 every day until finished., Starting Wed 08/15/2016, Print    benzonatate (TESSALON) 100 MG capsule Take 1 capsule (100 mg total) by mouth every 8 (eight) hours., Starting Wed 08/15/2016, Print      I personally performed the services described in this documentation, which was scribed in my presence. The recorded information has been reviewed and is accurate.    Kerrie Buffalo [place], [place] 08/16/16 [id number]    Vanetta Mulders, MD 08/16/16 [phone]

## 2016-08-15 NOTE — Discharge Instructions (Signed)
Use the inhaler 2 puffs every 4 hours as needed. Take the other medications as directed. Return as needed for worsening symptoms.

## 2016-08-15 NOTE — ED Notes (Signed)
No answer x2 
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
## 2016-12-27 ENCOUNTER — Encounter (HOSPITAL_COMMUNITY): Payer: Self-pay

## 2016-12-27 DIAGNOSIS — F17210 Nicotine dependence, cigarettes, uncomplicated: Secondary | ICD-10-CM | POA: Insufficient documentation

## 2016-12-27 DIAGNOSIS — Z79899 Other long term (current) drug therapy: Secondary | ICD-10-CM | POA: Insufficient documentation

## 2016-12-27 DIAGNOSIS — J329 Chronic sinusitis, unspecified: Secondary | ICD-10-CM | POA: Insufficient documentation

## 2016-12-27 NOTE — ED Triage Notes (Signed)
Pt arrives to Ed with c/o sinus infection that started today causing HA at 7/10; pt a&ox 4 on arrival. Pt denies any other sx-Monique,RN

## 2016-12-28 ENCOUNTER — Emergency Department (HOSPITAL_COMMUNITY)
Admission: EM | Admit: 2016-12-28 | Discharge: 2016-12-28 | Disposition: A | Discharge status: Home / Self Care | Payer: Self-pay | Attending: Emergency Medicine | Admitting: Emergency Medicine

## 2016-12-28 DIAGNOSIS — J329 Chronic sinusitis, unspecified: Secondary | ICD-10-CM

## 2016-12-28 MED ORDER — LORATADINE 10 MG PO TABS
10.0000 mg | ORAL_TABLET | Freq: Every day | ORAL | 1 refills | Status: DC
Start: 2016-12-28 — End: 2018-01-17

## 2016-12-28 MED ORDER — ALBUTEROL SULFATE HFA 108 (90 BASE) MCG/ACT IN AERS
2.0000 | INHALATION_SPRAY | RESPIRATORY_TRACT | Status: DC | PRN
  Administered 2016-12-28: 2 via RESPIRATORY_TRACT
  Filled 2016-12-28: qty 6.7

## 2016-12-28 MED ORDER — FLUTICASONE PROPIONATE 50 MCG/ACT NA SUSP: 2.0000 | Freq: Every day | NASAL | 0 refills | Status: DC

## 2016-12-28 NOTE — ED Notes (Signed)
Pt back in lobby.

## 2016-12-28 NOTE — ED Notes (Signed)
Pt verbalizes understanding of d/c instructions. Pt received prescriptions. Pt ambulatory at discharge with all belongings.

## 2016-12-28 NOTE — ED Provider Notes (Signed)
[facility] Provider Note   CSN: [id number] Arrival date & time: 12/27/16  2236     History   Chief Complaint Chief Complaint  Patient presents with  . Facial Pain  . Headache    HPI Manuel Silva is a 31 y.o. male.  HPI Patient has had nasal congestion and burning sensation for several weeks. He reports he feels pain up through his nose and his forehead that causes headaches. There is been some nasal drainage and discharge. Patient reports he's had loss of taste and smell. No fever. Occasional dry cough. Patient reports this does seem to be more seasonal nature with weather changes. He is a smoker. Past Medical History:  Diagnosis Date  . Gastritis     There are no active problems to display for this patient.   History reviewed. No pertinent surgical history.     Home Medications    Prior to Admission medications   Medication Sig Start Date End Date Taking? Authorizing Provider  azithromycin (ZITHROMAX) 250 MG tablet Take 1 tablet (250 mg total) by mouth daily. Take first 2 tablets together, then 1 every day until finished. 08/15/16   Janne Napoleon, NP  benzonatate (TESSALON) 100 MG capsule Take 1 capsule (100 mg total) by mouth every 8 (eight) hours. 08/15/16   Janne Napoleon, NP  dicyclomine (BENTYL) 20 MG tablet Take 1 tablet (20 mg total) by mouth 2 (two) times daily. 07/14/16   Renne Crigler, PA-C  fluticasone (FLONASE) 50 MCG/ACT nasal spray Place 2 sprays into both nostrils daily. 12/28/16   Arby Barrette, MD  loratadine (CLARITIN) 10 MG tablet Take 1 tablet (10 mg total) by mouth daily. 12/28/16   Arby Barrette, MD  ondansetron (ZOFRAN ODT) 4 MG disintegrating tablet Take 1 tablet (4 mg total) by mouth every 8 (eight) hours as needed for nausea or vomiting. 07/14/16   Renne Crigler, PA-C  promethazine (PHENERGAN) 25 MG tablet Take 1 tablet (25 mg total) by mouth every 6 (six) hours as needed for nausea. 07/20/16   Geoffery Lyons, MD    Family  History History reviewed. No pertinent family history.  Social History Social History  Substance Use Topics  . Smoking status: Current Some Day Smoker    Types: Cigarettes  . Smokeless tobacco: Never Used  . Alcohol use Yes     Comment: occasion     Allergies   Penicillins   Review of Systems Review of Systems 10 Systems reviewed and are negative for acute change except as noted in the HPI.   Physical Exam Updated Vital Signs BP 127/87   Pulse (!) 50   Temp 97.6 F (36.4 C) (Oral)   SpO2 97%   Physical Exam  Constitutional: He is oriented to person, place, and time. He appears well-developed and well-nourished.  HENT:  Head: Normocephalic and atraumatic.  Bilateral TMs normal. Modest amount of non-impacted cerumen bilateral ear canals. Posterior oropharynx widely patent. No erythema or exudate of the tonsils. Nares patent with slightly boggy mucosa turbinates.  Eyes: Conjunctivae and EOM are normal.  Neck: Neck supple.  Cardiovascular: Normal rate and regular rhythm.   No murmur heard. Pulmonary/Chest: Effort normal and breath sounds normal. No respiratory distress.  Abdominal: Soft. There is no tenderness.  Musculoskeletal: He exhibits no edema.  Neurological: He is alert and oriented to person, place, and time. No cranial nerve deficit. He exhibits normal muscle tone. Coordination normal.  Skin: Skin is warm and dry.  Psychiatric: He has a normal  mood and affect.  Nursing note and vitals reviewed.    ED Treatments / Results  Labs (all labs ordered are listed, but only abnormal results are displayed) Labs Reviewed - No data to display  EKG  EKG Interpretation None       Radiology No results found.  Procedures Procedures (including critical care time)  Medications Ordered in ED Medications  albuterol (PROVENTIL HFA;VENTOLIN HFA) 108 (90 Base) MCG/ACT inhaler 2 puff (not administered)     Initial Impression / Assessment and Plan / ED Course  I  have reviewed the triage vital signs and the nursing notes.  Pertinent labs & imaging results that were available during my care of the patient were reviewed by me and considered in my medical decision making (see chart for details).     Final Clinical Impressions(s) / ED Diagnoses   Final diagnoses:  Rhinosinusitis  Patient generally well and appearance. At this point in time I have lower suspicion for bacterial etiology. Patient's main symptoms are burning pain in the nose with loss of taste. Patient has noticed the symptoms become more pronounced at seasonal changes. In addition patient does smoke. Patient is counseled on smoking cessation. Plan will be for symptomatic treatment with nasal inhaled steroid and daily nonsedating antihistamine. Patient is counseled on follow-up with ENT for recheck.  New Prescriptions New Prescriptions   FLUTICASONE (FLONASE) 50 MCG/ACT NASAL SPRAY    Place 2 sprays into both nostrils daily.   LORATADINE (CLARITIN) 10 MG TABLET    Take 1 tablet (10 mg total) by mouth daily.     Arby Barrette, MD 12/28/16 [phone]

## 2017-02-08 ENCOUNTER — Encounter (HOSPITAL_BASED_OUTPATIENT_CLINIC_OR_DEPARTMENT_OTHER): Payer: Self-pay | Admitting: *Deleted

## 2017-02-08 ENCOUNTER — Other Ambulatory Visit: Payer: Self-pay

## 2017-02-08 ENCOUNTER — Emergency Department (HOSPITAL_BASED_OUTPATIENT_CLINIC_OR_DEPARTMENT_OTHER)
Admission: EM | Admit: 2017-02-08 | Discharge: 2017-02-08 | Disposition: A | Discharge status: Home / Self Care | Payer: Self-pay | Attending: Emergency Medicine | Admitting: Emergency Medicine

## 2017-02-08 DIAGNOSIS — H02842 Edema of right lower eyelid: Secondary | ICD-10-CM | POA: Insufficient documentation

## 2017-02-08 DIAGNOSIS — Z042 Encounter for examination and observation following work accident: Secondary | ICD-10-CM | POA: Insufficient documentation

## 2017-02-08 DIAGNOSIS — H0289 Other specified disorders of eyelid: Secondary | ICD-10-CM | POA: Insufficient documentation

## 2017-02-08 DIAGNOSIS — Z79899 Other long term (current) drug therapy: Secondary | ICD-10-CM | POA: Insufficient documentation

## 2017-02-08 DIAGNOSIS — F17210 Nicotine dependence, cigarettes, uncomplicated: Secondary | ICD-10-CM | POA: Insufficient documentation

## 2017-02-08 HISTORY — DX: Dorsalgia, unspecified: M54.9

## 2017-02-08 NOTE — ED Triage Notes (Addendum)
Pt states he was working at the [employer] 2 nights ago when a package busted open and a "white powder" went into his eye. States right  eye swelling has gotten worse over the past 2 days. Lower area of eye swollen and hard to touch. States vision with mild blurring to right eye. Sclera white to right eye.  C/o burning and itching. Has not tried any treatments at home.

## 2017-02-08 NOTE — ED Notes (Signed)
Pt verbalizes understanding of d/c instructions and denies any further needs at this time. 

## 2017-02-08 NOTE — ED Provider Notes (Signed)
[facility] EMERGENCY DEPARTMENT Provider Note   CSN: [id number] Arrival date & time: 02/08/17  0119     History   Chief Complaint Chief Complaint  Patient presents with  . eye irritation workers comp    HPI Manuel Silva is a 31 y.o. male.  Patient is a 31 year old male presenting for evaluation of right lower eyelid swelling and irritation.  He works at the [employer] office and reports getting "white powder" in his eyes at work.  Since then his lower eyelid has been swelling.  He reports burning and irritation and itching.  He does have some relief of the swelling with warm compresses.  He denies any visual disturbances and denies any eye discharge or drainage.   The history is provided by the patient.    Past Medical History:  Diagnosis Date  . Back pain   . Gastritis     There are no active problems to display for this patient.   History reviewed. No pertinent surgical history.     Home Medications    Prior to Admission medications   Medication Sig Start Date End Date Taking? Authorizing Provider  OxyCODONE HCl (OXYCONTIN PO) Take by mouth.   Yes [provider]  azithromycin (ZITHROMAX) 250 MG tablet Take 1 tablet (250 mg total) by mouth daily. Take first 2 tablets together, then 1 every day until finished. 08/15/16   Janne Napoleon, NP  benzonatate (TESSALON) 100 MG capsule Take 1 capsule (100 mg total) by mouth every 8 (eight) hours. 08/15/16   Janne Napoleon, NP  dicyclomine (BENTYL) 20 MG tablet Take 1 tablet (20 mg total) by mouth 2 (two) times daily. 07/14/16   Renne Crigler, PA-C  fluticasone (FLONASE) 50 MCG/ACT nasal spray Place 2 sprays into both nostrils daily. 12/28/16   Arby Barrette, MD  loratadine (CLARITIN) 10 MG tablet Take 1 tablet (10 mg total) by mouth daily. 12/28/16   Arby Barrette, MD  ondansetron (ZOFRAN ODT) 4 MG disintegrating tablet Take 1 tablet (4 mg total) by mouth every 8 (eight) hours as needed for nausea or  vomiting. 07/14/16   Renne Crigler, PA-C  promethazine (PHENERGAN) 25 MG tablet Take 1 tablet (25 mg total) by mouth every 6 (six) hours as needed for nausea. 07/20/16   Geoffery Lyons, MD    Family History No family history on file.  Social History Social History   Tobacco Use  . Smoking status: Current Some Day Smoker    Types: Cigarettes  . Smokeless tobacco: Never Used  Substance Use Topics  . Alcohol use: Yes    Comment: occasion  . Drug use: No     Allergies   Penicillins   Review of Systems Review of Systems  All other systems reviewed and are negative.    Physical Exam Updated Vital Signs BP (!) 142/96 (BP Location: Left Arm)   Pulse (!) 51   Temp 97.7 F (36.5 C) (Oral)   Resp 16   SpO2 100%   Physical Exam  Constitutional: He is oriented to person, place, and time. He appears well-developed and well-nourished. No distress.  HENT:  Head: Normocephalic and atraumatic.  Eyes:  The right lower eyelid is swollen and edematous.  The conjunctiva appears normal and cornea is clear.  He has full range of motion of the eye without any discomfort.  Neck: Normal range of motion. Neck supple.  Neurological: He is alert and oriented to person, place, and time.  Skin: Skin is warm and  dry. He is not diaphoretic.  Nursing note and vitals reviewed.    ED Treatments / Results  Labs (all labs ordered are listed, but only abnormal results are displayed) Labs Reviewed - No data to display  EKG  EKG Interpretation None       Radiology No results found.  Procedures Procedures (including critical care time)  Medications Ordered in ED Medications - No data to display   Initial Impression / Assessment and Plan / ED Course  I have reviewed the triage vital signs and the nursing notes.  Pertinent labs & imaging results that were available during my care of the patient were reviewed by me and considered in my medical decision making (see chart for  details).  I suspect the lower eyelid swelling is allergic in nature.  He reports itching and burning and it appears as though there is fluid within the eyelid.  I see no obvious stye.  The eye otherwise appears grossly normal.  I will recommend antihistamines and continued warm compresses.  He is to follow-up as needed for any problems.  Final Clinical Impressions(s) / ED Diagnoses   Final diagnoses:  None    ED Discharge Orders    None       Geoffery Lyonselo, Jhamari Markowicz, MD 02/08/17 0225

## 2017-02-08 NOTE — ED Notes (Signed)
Wears glasses but did not bring them with him tonight.

## 2017-02-08 NOTE — Discharge Instructions (Signed)
Apply warm compresses as frequently as possible for the next several days.  Benadryl 25 mg every 6 hours for the next 2-3 days.  Return to the emergency department if symptoms significantly worsen or change.

## 2017-02-11 ENCOUNTER — Other Ambulatory Visit: Payer: Self-pay

## 2017-02-11 ENCOUNTER — Encounter (HOSPITAL_COMMUNITY): Payer: Self-pay | Admitting: Emergency Medicine

## 2017-02-11 DIAGNOSIS — F17210 Nicotine dependence, cigarettes, uncomplicated: Secondary | ICD-10-CM | POA: Insufficient documentation

## 2017-02-11 DIAGNOSIS — H00012 Hordeolum externum right lower eyelid: Secondary | ICD-10-CM | POA: Insufficient documentation

## 2017-02-11 DIAGNOSIS — Z79899 Other long term (current) drug therapy: Secondary | ICD-10-CM | POA: Insufficient documentation

## 2017-02-11 NOTE — ED Triage Notes (Signed)
Pt c/o R eye pain/itching/burning/swelling and redness. +blurred vision Pt seen 11/23 after white powder substance went into his eye while at work. Pt states vision and swelling have continued to worsen.

## 2017-02-12 ENCOUNTER — Emergency Department (HOSPITAL_COMMUNITY)
Admission: EM | Admit: 2017-02-12 | Discharge: 2017-02-12 | Disposition: A | Discharge status: Home / Self Care | Payer: Self-pay | Attending: Emergency Medicine | Admitting: Emergency Medicine

## 2017-02-12 DIAGNOSIS — H00012 Hordeolum externum right lower eyelid: Secondary | ICD-10-CM

## 2017-02-12 NOTE — Discharge Instructions (Signed)
Apply warm compresses. Take ibuprofen or naproxen as needed for pain.

## 2017-02-12 NOTE — ED Notes (Signed)
Pt understood dc material and follow up info

## 2017-02-12 NOTE — ED Provider Notes (Signed)
[facility]   CSN: [id number] Arrival date & time: 02/11/17  2314     History   Chief Complaint Chief Complaint  Patient presents with  . Eye Pain    HPI Manuel Silva is a 31 y.o. male.  The history is provided by the patient.  He complains of ongoing pain and swelling of his right eye.  He was seen in the ED 4 days ago with irritation of his right eye which she related to exposure to powder from where he was working in the [employer].  He was felt to have an allergic reaction.  He has been applying warm compresses, but the lower lid has become more swollen and more painful.  He rates pain at 8/10.  He has not done anything else for his problem.  He does notice occasional blurring of his vision.  He denies any drainage from the eye.  Past Medical History:  Diagnosis Date  . Back pain   . Gastritis     There are no active problems to display for this patient.   History reviewed. No pertinent surgical history.     Home Medications    Prior to Admission medications   Medication Sig Start Date End Date Taking? Authorizing Provider  azithromycin (ZITHROMAX) 250 MG tablet Take 1 tablet (250 mg total) by mouth daily. Take first 2 tablets together, then 1 every day until finished. 08/15/16   Janne NapoleonNeese, Hope M, NP  benzonatate (TESSALON) 100 MG capsule Take 1 capsule (100 mg total) by mouth every 8 (eight) hours. 08/15/16   Janne NapoleonNeese, Hope M, NP  dicyclomine (BENTYL) 20 MG tablet Take 1 tablet (20 mg total) by mouth 2 (two) times daily. 07/14/16   Renne CriglerGeiple, Joshua, PA-C  fluticasone (FLONASE) 50 MCG/ACT nasal spray Place 2 sprays into both nostrils daily. 12/28/16   Arby BarrettePfeiffer, Marcy, MD  loratadine (CLARITIN) 10 MG tablet Take 1 tablet (10 mg total) by mouth daily. 12/28/16   Arby BarrettePfeiffer, Marcy, MD  ondansetron (ZOFRAN ODT) 4 MG disintegrating tablet Take 1 tablet (4 mg total) by mouth every 8 (eight) hours as needed for nausea or  vomiting. 07/14/16   Renne CriglerGeiple, Joshua, PA-C  OxyCODONE HCl (OXYCONTIN PO) Take by mouth.    [provider]  promethazine (PHENERGAN) 25 MG tablet Take 1 tablet (25 mg total) by mouth every 6 (six) hours as needed for nausea. 07/20/16   Geoffery Lyonselo, Douglas, MD    Family History No family history on file.  Social History Social History   Tobacco Use  . Smoking status: Current Some Day Smoker    Types: Cigarettes  . Smokeless tobacco: Never Used  Substance Use Topics  . Alcohol use: Yes    Comment: occasion  . Drug use: No     Allergies   Penicillins   Review of Systems Review of Systems  All other systems reviewed and are negative.    Physical Exam Updated Vital Signs BP 131/76 (BP Location: Right Arm)   Pulse (!) 58   Temp 98.2 F (36.8 C) (Oral)   Resp 18   Ht 5\' 9"  (1.753 m)   Wt 89.4 kg (197 lb)   SpO2 99%   BMI 29.09 kg/m   Physical Exam  Nursing note and vitals reviewed.  31 year old male, resting comfortably and in no acute distress. Vital signs are normal. Oxygen saturation is 99%, which is normal. Head is normocephalic and atraumatic. PERRLA, EOMI. There is swelling of  the lower lid of the right eye with an area that is coming to a point consistent with hordeolum.  There is no conjunctival injection, no corneal foreign body.  Anterior chamber is clear.  There is no preauricular lymph node palpable.  Oropharynx is clear. Neck is nontender and supple without adenopathy or JVD. Back is nontender and there is no CVA tenderness. Lungs are clear without rales, wheezes, or rhonchi. Chest is nontender. Heart has regular rate and rhythm without murmur. Abdomen is soft, flat, nontender without masses or hepatosplenomegaly and peristalsis is normoactive. Extremities have no cyanosis or edema, full range of motion is present. Skin is warm and dry without rash. Neurologic: Mental status is normal, cranial nerves are intact, there are no motor or sensory  deficits.  ED Treatments / Results   Procedures Procedures (including critical care time)  Medications Ordered in ED Medications - No data to display   Initial Impression / Assessment and Plan / ED Course  I have reviewed the triage vital signs and the nursing notes.  Hordeolum of the right eye.  He is referred to ophthalmology.  In the meantime, he is advised to use warm compresses and take over-the-counter analgesics as needed for pain.  Old records are reviewed confirming ED visit on 1123 for lower lid swelling which was felt to be an allergic conjunctivitis.  Final Clinical Impressions(s) / ED Diagnoses   Final diagnoses:  Hordeolum externum of right lower eyelid    ED Discharge Orders    None       Dione BoozeGlick, Iliani Vejar, MD 02/12/17 [phone]

## 2017-02-17 ENCOUNTER — Other Ambulatory Visit: Payer: Self-pay

## 2017-02-17 DIAGNOSIS — Z79899 Other long term (current) drug therapy: Secondary | ICD-10-CM | POA: Insufficient documentation

## 2017-02-17 DIAGNOSIS — Z79891 Long term (current) use of opiate analgesic: Secondary | ICD-10-CM | POA: Insufficient documentation

## 2017-02-17 DIAGNOSIS — F17210 Nicotine dependence, cigarettes, uncomplicated: Secondary | ICD-10-CM | POA: Insufficient documentation

## 2017-02-17 DIAGNOSIS — H00012 Hordeolum externum right lower eyelid: Secondary | ICD-10-CM | POA: Insufficient documentation

## 2017-02-18 ENCOUNTER — Other Ambulatory Visit: Payer: Self-pay

## 2017-02-18 ENCOUNTER — Encounter (HOSPITAL_COMMUNITY): Payer: Self-pay | Admitting: *Deleted

## 2017-02-18 ENCOUNTER — Emergency Department (HOSPITAL_COMMUNITY)
Admission: EM | Admit: 2017-02-18 | Discharge: 2017-02-18 | Disposition: A | Discharge status: Home / Self Care | Payer: Self-pay | Attending: Emergency Medicine | Admitting: Emergency Medicine

## 2017-02-18 DIAGNOSIS — H00012 Hordeolum externum right lower eyelid: Secondary | ICD-10-CM

## 2017-02-18 MED ORDER — FLUORESCEIN SODIUM 1 MG OP STRP
1.0000 | ORAL_STRIP | Freq: Once | OPHTHALMIC | Status: AC
  Administered 2017-02-18: 1 via OPHTHALMIC
  Filled 2017-02-18: qty 1

## 2017-02-18 MED ORDER — ERYTHROMYCIN 5 MG/GM OP OINT: TOPICAL_OINTMENT | OPHTHALMIC | 0 refills | Status: DC

## 2017-02-18 MED ORDER — TETRACAINE HCL 0.5 % OP SOLN
2.0000 [drp] | Freq: Once | OPHTHALMIC | Status: AC
  Administered 2017-02-18: 2 [drp] via OPHTHALMIC
  Filled 2017-02-18: qty 4

## 2017-02-18 NOTE — ED Notes (Signed)
See providers notes

## 2017-02-18 NOTE — ED Provider Notes (Signed)
[facility]   CSN: [id number] Arrival date & time: 02/17/17  2350     History   Chief Complaint Chief Complaint  Patient presents with  . Eye Drainage    HPI Manuel Silva is a 31 y.o. male with a hx of no major medical problems presents to the Emergency Department complaining of gradual, persistent, progressively worsening swelling of the right lower lid onset approx 1 week ago. Associated symptoms include drainage of the site. He has been squeezing the site.  Pt has been using warm compresses with improvement, but is concerned that this is not resolved.  He reports he had blurred vision when it was more swollen, but this has resolved and not returned.  Denies fever or chills.  He reports the swelling has improved and has not worsened.  He denies spreading of redness or induration.  Denies pain within the globe.  Minimal pain at the site.  Record review shows that patient was seen 02/08/2017 for chemical exposure with lower lid swelling which was attributed to allergic reaction.  He was evaluated again on 02/12/2017 and diagnosed with lower lid hordeolum.  No antibiotic was prescribed at that time. The history is provided by the patient and medical records. No language interpreter was used.    Past Medical History:  Diagnosis Date  . Back pain   . Gastritis     There are no active problems to display for this patient.   History reviewed. No pertinent surgical history.     Home Medications    Prior to Admission medications   Medication Sig Start Date End Date Taking? Authorizing Provider  azithromycin (ZITHROMAX) 250 MG tablet Take 1 tablet (250 mg total) by mouth daily. Take first 2 tablets together, then 1 every day until finished. 08/15/16   Janne NapoleonNeese, Hope M, NP  benzonatate (TESSALON) 100 MG capsule Take 1 capsule (100 mg total) by mouth every 8 (eight) hours. 08/15/16   Janne NapoleonNeese, Hope M, NP  dicyclomine (BENTYL) 20  MG tablet Take 1 tablet (20 mg total) by mouth 2 (two) times daily. 07/14/16   Renne CriglerGeiple, Joshua, PA-C  erythromycin ophthalmic ointment Place a 1/2 inch ribbon of ointment into the lower eyelid every 4 hours for 5 days 02/18/17   Sherrine Salberg, Dahlia ClientHannah, PA-C  fluticasone (FLONASE) 50 MCG/ACT nasal spray Place 2 sprays into both nostrils daily. 12/28/16   Arby BarrettePfeiffer, Marcy, MD  loratadine (CLARITIN) 10 MG tablet Take 1 tablet (10 mg total) by mouth daily. 12/28/16   Arby BarrettePfeiffer, Marcy, MD  ondansetron (ZOFRAN ODT) 4 MG disintegrating tablet Take 1 tablet (4 mg total) by mouth every 8 (eight) hours as needed for nausea or vomiting. 07/14/16   Renne CriglerGeiple, Joshua, PA-C  OxyCODONE HCl (OXYCONTIN PO) Take by mouth.    [provider]  promethazine (PHENERGAN) 25 MG tablet Take 1 tablet (25 mg total) by mouth every 6 (six) hours as needed for nausea. 07/20/16   Geoffery Lyonselo, Douglas, MD    Family History No family history on file.  Social History Social History   Tobacco Use  . Smoking status: Current Some Day Smoker    Types: Cigarettes  . Smokeless tobacco: Never Used  Substance Use Topics  . Alcohol use: Yes    Comment: occasion  . Drug use: No     Allergies   Penicillins   Review of Systems Review of Systems  Constitutional: Negative for chills and fever.  HENT: Negative for congestion, ear pain, postnasal drip,  rhinorrhea, sinus pressure and sinus pain.   Eyes: Positive for redness ( lower lid). Negative for discharge and visual disturbance.  Gastrointestinal: Negative for nausea and vomiting.  Endocrine: Negative for polydipsia, polyphagia and polyuria.  Neurological: Negative for headaches.     Physical Exam Updated Vital Signs BP 134/72 (BP Location: Right Arm)   Pulse 60   Temp (!) 97.5 F (36.4 C) (Oral)   Resp 14   Ht 5\' 9"  (1.753 m)   Wt 89.4 kg (197 lb)   SpO2 98%   BMI 29.09 kg/m   Physical Exam  Constitutional: He appears well-developed and well-nourished. No distress.    HENT:  Head: Normocephalic and atraumatic.  Nose: Nose normal. No mucosal edema or rhinorrhea.  Mouth/Throat: Uvula is midline, oropharynx is clear and moist and mucous membranes are normal. No uvula swelling.  Eyes: Conjunctivae, EOM and lids are normal. Pupils are equal, round, and reactive to light. Lids are everted and swept, no foreign bodies found. Right eye exhibits hordeolum ( external). Right eye exhibits no chemosis, no discharge and no exudate. No foreign body present in the right eye. Left eye exhibits no chemosis, no discharge, no exudate and no hordeolum. No foreign body present in the left eye. Right conjunctiva is not injected. Right conjunctiva has no hemorrhage. Left conjunctiva is not injected. Left conjunctiva has no hemorrhage.  Slit lamp exam:      The right eye shows no corneal abrasion, no corneal flare, no corneal ulcer, no foreign body, no fluorescein uptake and no anterior chamber bulge.  Pupils equal round and reactive to light No vertical, horizontal or rotational nystagmus No Corneal abrasion noted to the right eye  No visible foreign body No corneal flare, ulcer or dendritic staining  No herpetic lesions to the face or around the eye  Neck: Normal range of motion.  Full range of motion without pain No midline or paraspinal tenderness No nuchal rigidity; no meningeal signs  Cardiovascular: Normal rate, regular rhythm and intact distal pulses.  Pulmonary/Chest: Effort normal. No respiratory distress.  Musculoskeletal: Normal range of motion.  Lymphadenopathy:       Head (right side): No preauricular, no posterior auricular and no occipital adenopathy present.  Neurological: He is alert.  Mental Status:  Alert, thought content appropriate. Speech fluent without evidence of aphasia.  Able to follow 2 step commands without difficulty.  Cranial Nerves:  II:  Peripheral visual fields grossly normal, pupils equal, round, reactive to light III,IV, VI: ptosis not  present, extra-ocular motions intact bilaterally  V,VII: smile symmetric, VIII: hearing grossly normal bilaterally  IX,X: midline uvula rise XII: midline tongue extension   Skin: Skin is warm and dry. He is not diaphoretic. No erythema.  Psychiatric: He has a normal mood and affect.  Nursing note and vitals reviewed.    ED Treatments / Results   Procedures Procedures (including critical care time)  Medications Ordered in ED Medications  fluorescein ophthalmic strip 1 strip (1 strip Right Eye Given 02/18/17 0121)  tetracaine (PONTOCAINE) 0.5 % ophthalmic solution 2 drop (2 drops Right Eye Given 02/18/17 0121)     Initial Impression / Assessment and Plan / ED Course  I have reviewed the triage vital signs and the nursing notes.  Pertinent labs & imaging results that were available during my care of the patient were reviewed by me and considered in my medical decision making (see chart for details).     Sheet with external hordeolum that has been draining.  He reports some improvement but not complete resolution.  He has been using warm compresses.  No significant pain.  No fevers or chills.  Small area of swelling and fluctuance with visible site of drainage and some surrounding edema/erythema but no induration to suggest spreading cellulitis.  No lymphadenopathy.  No pain with EOMs.  No internal hordeolum.  No evidence of foreign body or corneal abrasion.  No clinical evidence of periorbital cellulitis.  Patient will be given erythromycin.  Discussed reasons to return to the emergency department including signs of developing cellulitis, changes in vision, development of fevers or other concerns.  Patient is to follow-up with the [facility] and ophthalmology.  He states understanding and is in agreement with this plan.   Final Clinical Impressions(s) / ED Diagnoses   Final diagnoses:  Hordeolum externum of right lower eyelid    ED Discharge Orders        Ordered     erythromycin ophthalmic ointment     02/18/17 0216       Ruie Sendejo, Dahlia Client, PA-C 02/18/17 1610    Gwyneth Sprout, MD 02/18/17 [phone]

## 2017-02-18 NOTE — Discharge Instructions (Signed)
1. Medications: Erythromycin, usual home medications 2. Treatment: rest, drink plenty of fluids, continue warm compresses 3x per day 3. Follow Up: Please followup with ophthalmology in 2-3 days and [facility] follow[facility]up appointment for discussion of your diagnoses and further evaluation after today's visit; if you do not have a primary care doctor use the resource guide provided to find one; Please return to the ER for worsening symptoms, spreading redness, blurred vision, fever or other concerns

## 2017-02-18 NOTE — ED Triage Notes (Signed)
The pt has a stye on his rt eyelid for one week

## 2017-02-28 ENCOUNTER — Encounter (HOSPITAL_COMMUNITY): Payer: Self-pay | Admitting: Emergency Medicine

## 2017-02-28 ENCOUNTER — Other Ambulatory Visit: Payer: Self-pay

## 2017-02-28 DIAGNOSIS — Z79899 Other long term (current) drug therapy: Secondary | ICD-10-CM | POA: Insufficient documentation

## 2017-02-28 DIAGNOSIS — H00012 Hordeolum externum right lower eyelid: Secondary | ICD-10-CM | POA: Insufficient documentation

## 2017-02-28 DIAGNOSIS — F17210 Nicotine dependence, cigarettes, uncomplicated: Secondary | ICD-10-CM | POA: Insufficient documentation

## 2017-02-28 NOTE — ED Triage Notes (Signed)
Pt having small abscess on his right eye, was seen here 2 weeks ago sent home with treatment and oriented to come on 2 weeks to follow up. Pt denies any visual changes no pain at this time.

## 2017-03-01 ENCOUNTER — Emergency Department (HOSPITAL_COMMUNITY)
Admission: EM | Admit: 2017-03-01 | Discharge: 2017-03-01 | Disposition: A | Discharge status: Home / Self Care | Payer: Self-pay | Attending: Emergency Medicine | Admitting: Emergency Medicine

## 2017-03-01 DIAGNOSIS — H00012 Hordeolum externum right lower eyelid: Secondary | ICD-10-CM

## 2017-03-01 NOTE — ED Provider Notes (Signed)
[facility]   CSN: [id number] Arrival date & time: 02/28/17  2320     History   Chief Complaint Chief Complaint  Patient presents with  . Abscess    follow up    HPI Manuel Silva is a 31 y.o. male.  HPI   31 year old male who was diagnosed with hordeolum involving the right lower eyelid 2 weeks ago presenting for a follow-up.  Patient states he is still having some swallowing although it has improved from prior.  He is here because it has not fully resolved.  He has been using antibiotic cream but have not been using warm compress.  He denies any vision changes, pain with eye movement, or headache.  No other complaint.  Past Medical History:  Diagnosis Date  . Back pain   . Gastritis     There are no active problems to display for this patient.   History reviewed. No pertinent surgical history.     Home Medications    Prior to Admission medications   Medication Sig Start Date End Date Taking? Authorizing Provider  azithromycin (ZITHROMAX) 250 MG tablet Take 1 tablet (250 mg total) by mouth daily. Take first 2 tablets together, then 1 every day until finished. 08/15/16   Janne NapoleonNeese, Hope M, NP  benzonatate (TESSALON) 100 MG capsule Take 1 capsule (100 mg total) by mouth every 8 (eight) hours. 08/15/16   Janne NapoleonNeese, Hope M, NP  dicyclomine (BENTYL) 20 MG tablet Take 1 tablet (20 mg total) by mouth 2 (two) times daily. 07/14/16   Renne CriglerGeiple, Joshua, PA-C  erythromycin ophthalmic ointment Place a 1/2 inch ribbon of ointment into the lower eyelid every 4 hours for 5 days 02/18/17   Muthersbaugh, Dahlia ClientHannah, PA-C  fluticasone (FLONASE) 50 MCG/ACT nasal spray Place 2 sprays into both nostrils daily. 12/28/16   Arby BarrettePfeiffer, Marcy, MD  loratadine (CLARITIN) 10 MG tablet Take 1 tablet (10 mg total) by mouth daily. 12/28/16   Arby BarrettePfeiffer, Marcy, MD  ondansetron (ZOFRAN ODT) 4 MG disintegrating tablet Take 1 tablet (4 mg total) by mouth every 8  (eight) hours as needed for nausea or vomiting. 07/14/16   Renne CriglerGeiple, Joshua, PA-C  OxyCODONE HCl (OXYCONTIN PO) Take by mouth.    [provider]  promethazine (PHENERGAN) 25 MG tablet Take 1 tablet (25 mg total) by mouth every 6 (six) hours as needed for nausea. 07/20/16   Geoffery Lyonselo, Douglas, MD    Family History No family history on file.  Social History Social History   Tobacco Use  . Smoking status: Current Some Day Smoker    Types: Cigarettes  . Smokeless tobacco: Never Used  Substance Use Topics  . Alcohol use: Yes    Comment: occasion  . Drug use: No     Allergies   Penicillins   Review of Systems Review of Systems  Constitutional: Negative for fever.  Eyes: Negative for photophobia, pain, discharge, redness, itching and visual disturbance.  Neurological: Negative for numbness.     Physical Exam Updated Vital Signs BP (!) 142/85 (BP Location: Right Arm)   Pulse (!) 53   Temp 98.2 F (36.8 C) (Oral)   Resp 15   Ht 5\' 9"  (1.753 m)   Wt 89.4 kg (197 lb)   SpO2 100%   BMI 29.09 kg/m   Physical Exam  Constitutional: He appears well-developed and well-nourished. No distress.  HENT:  Head: Atraumatic.  Eyes: Conjunctivae and EOM are normal. Pupils are equal, round, and reactive  to light. Right eye exhibits no discharge. Left eye exhibits no discharge. No scleral icterus.  R eye: lower eyelid is mildly edematous with a 3mm flesh color growth inferior to the eyelid.  No significant tenderness to palpation.  R eye with normal appearance.   Neck: Neck supple.  Neurological: He is alert.  Skin: No rash noted.  Psychiatric: He has a normal mood and affect.  Nursing note and vitals reviewed.    ED Treatments / Results  Labs (all labs ordered are listed, but only abnormal results are displayed) Labs Reviewed - No data to display  EKG  EKG Interpretation None       Radiology No results found.  Procedures Procedures (including critical care  time)  Medications Ordered in ED Medications - No data to display   Initial Impression / Assessment and Plan / ED Course  I have reviewed the triage vital signs and the nursing notes.  Pertinent labs & imaging results that were available during my care of the patient were reviewed by me and considered in my medical decision making (see chart for details).     BP (!) 142/85 (BP Location: Right Arm)   Pulse (!) 53   Temp 98.2 F (36.8 C) (Oral)   Resp 15   Ht 5\' 9"  (1.753 m)   Wt 89.4 kg (197 lb)   SpO2 100%   BMI 29.09 kg/m    Final Clinical Impressions(s) / ED Diagnoses   Final diagnoses:  Hordeolum externum of right lower eyelid    ED Discharge Orders    None     3:03 AM Patient was diagnosed with an external hordeolum involving the right lower he was given antibiotic and warm compress.  He still has some mild swelling but states that it has improved..  On exam, no obvious signs of infection noted aside from the swelling.  I encourage patient to continues with warm compress and antibiotic cream. Recommend f/u with ophthalmologist as needed.  Return precaution given.    Fayrene Helperran, Jodi Kappes, PA-C 03/01/17 [phone]    Zadie RhineWickline, Donald, MD 03/01/17 [phone]

## 2017-03-01 NOTE — Discharge Instructions (Signed)
Please continue with warm compress.  Continue using antibiotic cream as well.  Follow up with eye specialist next week if you have no improvement of your condition.

## 2017-07-10 ENCOUNTER — Emergency Department (HOSPITAL_COMMUNITY)
Admission: EM | Admit: 2017-07-10 | Discharge: 2017-07-10 | Disposition: A | Discharge status: Home / Self Care | Payer: No Typology Code available for payment source | Attending: Emergency Medicine | Admitting: Emergency Medicine

## 2017-07-10 ENCOUNTER — Emergency Department (HOSPITAL_COMMUNITY): Payer: No Typology Code available for payment source

## 2017-07-10 DIAGNOSIS — S838X1A Sprain of other specified parts of right knee, initial encounter: Secondary | ICD-10-CM | POA: Diagnosis not present

## 2017-07-10 DIAGNOSIS — F17210 Nicotine dependence, cigarettes, uncomplicated: Secondary | ICD-10-CM | POA: Insufficient documentation

## 2017-07-10 DIAGNOSIS — Y999 Unspecified external cause status: Secondary | ICD-10-CM | POA: Diagnosis not present

## 2017-07-10 DIAGNOSIS — Y929 Unspecified place or not applicable: Secondary | ICD-10-CM | POA: Diagnosis not present

## 2017-07-10 DIAGNOSIS — Y9389 Activity, other specified: Secondary | ICD-10-CM | POA: Insufficient documentation

## 2017-07-10 DIAGNOSIS — Z79899 Other long term (current) drug therapy: Secondary | ICD-10-CM | POA: Insufficient documentation

## 2017-07-10 DIAGNOSIS — S161XXA Strain of muscle, fascia and tendon at neck level, initial encounter: Secondary | ICD-10-CM | POA: Diagnosis not present

## 2017-07-10 DIAGNOSIS — M7918 Myalgia, other site: Secondary | ICD-10-CM

## 2017-07-10 MED ORDER — MORPHINE SULFATE (PF) 4 MG/ML IV SOLN
4.0000 mg | Freq: Once | INTRAVENOUS | Status: AC
  Administered 2017-07-10: 4 mg via INTRAVENOUS
  Filled 2017-07-10: qty 1

## 2017-07-10 MED ORDER — METHOCARBAMOL 750 MG PO TABS: 750.0000 mg | ORAL_TABLET | Freq: Four times a day (QID) | ORAL | 0 refills | Status: DC

## 2017-07-10 MED ORDER — KETOROLAC TROMETHAMINE 30 MG/ML IJ SOLN
15.0000 mg | Freq: Once | INTRAMUSCULAR | Status: AC
  Administered 2017-07-10: 15 mg via INTRAVENOUS
  Filled 2017-07-10: qty 1

## 2017-07-10 MED ORDER — IBUPROFEN 400 MG PO TABS: 400.0000 mg | ORAL_TABLET | Freq: Four times a day (QID) | ORAL | 0 refills | Status: DC | PRN

## 2017-07-10 MED ORDER — LORAZEPAM 2 MG/ML IJ SOLN
1.0000 mg | Freq: Once | INTRAMUSCULAR | Status: AC
  Administered 2017-07-10: 1 mg via INTRAVENOUS
  Filled 2017-07-10: qty 1
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
## 2017-07-10 NOTE — ED Notes (Signed)
Manuel CowmanAllan, MD removed LSB
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
## 2017-07-10 NOTE — ED Notes (Signed)
Pt attempted to contact his mother on his cell phone

## 2017-07-10 NOTE — Progress Notes (Signed)
Orthopedic Tech Progress Note Patient Details:  Trenda MootsChristopher A Cangemi 1985-08-24 [id number]  Ortho Devices Type of Ortho Device: Crutches, Knee Sleeve Ortho Device/Splint Location: RLE Ortho Device/Splint Interventions: Ordered, Application, Adjustment   Post Interventions Patient Tolerated: Well Instructions Provided: Care of device   Jennye MoccasinHughes, Aragon Scarantino Craig 07/10/2017, 4:18 PM

## 2017-07-15 ENCOUNTER — Other Ambulatory Visit: Payer: Self-pay

## 2017-07-16 ENCOUNTER — Other Ambulatory Visit: Payer: Self-pay

## 2017-07-16 ENCOUNTER — Emergency Department (HOSPITAL_COMMUNITY): Payer: No Typology Code available for payment source

## 2017-07-16 ENCOUNTER — Encounter (HOSPITAL_COMMUNITY): Payer: Self-pay | Admitting: Emergency Medicine

## 2017-07-16 ENCOUNTER — Emergency Department (HOSPITAL_COMMUNITY)
Admission: EM | Admit: 2017-07-16 | Discharge: 2017-07-16 | Disposition: A | Discharge status: Home / Self Care | Payer: No Typology Code available for payment source | Attending: Emergency Medicine | Admitting: Emergency Medicine

## 2017-07-16 DIAGNOSIS — T148XXA Other injury of unspecified body region, initial encounter: Secondary | ICD-10-CM

## 2017-07-16 DIAGNOSIS — Z79899 Other long term (current) drug therapy: Secondary | ICD-10-CM | POA: Diagnosis not present

## 2017-07-16 DIAGNOSIS — M25511 Pain in right shoulder: Secondary | ICD-10-CM | POA: Insufficient documentation

## 2017-07-16 DIAGNOSIS — F17210 Nicotine dependence, cigarettes, uncomplicated: Secondary | ICD-10-CM | POA: Diagnosis not present

## 2017-07-16 DIAGNOSIS — M25561 Pain in right knee: Secondary | ICD-10-CM | POA: Insufficient documentation

## 2017-07-16 MED ORDER — NAPROXEN 250 MG PO TABS
500.0000 mg | ORAL_TABLET | Freq: Once | ORAL | Status: AC
  Administered 2017-07-16: 500 mg via ORAL
  Filled 2017-07-16: qty 2

## 2017-07-16 MED ORDER — CYCLOBENZAPRINE HCL 5 MG PO TABS: 5.0000 mg | ORAL_TABLET | Freq: Every evening | ORAL | 0 refills | Status: DC | PRN
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
## 2017-07-16 NOTE — Discharge Instructions (Signed)
Take ibuprofen 3 times a day with meals.  Do not take other anti-inflammatories at the same time open (Advil, Motrin, naproxen, Aleve). You may supplement with Tylenol if you need further pain control. Use Flexeril as needed for muscle stiffness or soreness.  Have caution, this may make you tired or groggy.  Do not drive or operate heavy machinery while taking this medicine. Do stretches to help relieve pain. There are some stretching exercises provided in the paperwork.  Use heating pads to help control your pain. Use muscle creams, such as icy hot, bengay, or salonpas for muscular pain. You will likely have continued muscle stiffness and soreness over the next couple days.   Follow-up with the orthopedic doctor if your symptoms are not improving. Return to the emergency room if you develop vision changes, vomiting, slurred speech, numbness, loss of bowel or bladder control, or any new or worsening symptoms.

## 2018-01-17 ENCOUNTER — Other Ambulatory Visit: Payer: Self-pay

## 2018-01-17 ENCOUNTER — Emergency Department (HOSPITAL_COMMUNITY)
Admission: EM | Admit: 2018-01-17 | Discharge: 2018-01-17 | Disposition: A | Discharge status: Home / Self Care | Payer: Self-pay | Attending: Emergency Medicine | Admitting: Emergency Medicine

## 2018-01-17 DIAGNOSIS — F17210 Nicotine dependence, cigarettes, uncomplicated: Secondary | ICD-10-CM | POA: Insufficient documentation

## 2018-01-17 DIAGNOSIS — J00 Acute nasopharyngitis [common cold]: Secondary | ICD-10-CM | POA: Insufficient documentation

## 2018-01-17 MED ORDER — IBUPROFEN 600 MG PO TABS: 600.0000 mg | ORAL_TABLET | Freq: Four times a day (QID) | ORAL | 0 refills | Status: DC | PRN

## 2018-01-17 MED ORDER — KETOROLAC TROMETHAMINE 60 MG/2ML IM SOLN
60.0000 mg | Freq: Once | INTRAMUSCULAR | Status: AC
  Administered 2018-01-17: 60 mg via INTRAMUSCULAR
  Filled 2018-01-17: qty 2

## 2018-01-17 MED ORDER — PROMETHAZINE-DM 6.25-15 MG/5ML PO SYRP: 5.0000 mL | ORAL_SOLUTION | Freq: Four times a day (QID) | ORAL | 0 refills | Status: DC | PRN

## 2018-01-17 NOTE — ED Notes (Signed)
ED Provider at bedside. 

## 2018-01-17 NOTE — ED Triage Notes (Signed)
Pt to ER for evaluation of nasal congestion and sinus pressure x3 days with yellow nasal drainage. Pt in NAD.

## 2018-01-17 NOTE — ED Provider Notes (Signed)
[facility] EMERGENCY DEPARTMENT Provider Note   CSN: [id number] Arrival date & time: 01/17/18  1057     History   Chief Complaint Chief Complaint  Patient presents with  . Nasal Congestion    HPI VAL Manuel Silva is a 32 y.o. male past medical history who presents to the emergency department with a chief complaint of sneezing, rhinorrhea, nasal congestion, headache, and sinus pain and pressure that has been constant and gradually worsening over the last 3 days.  Nasal drainage has been yellow.  He reports subjective fever and chills.  He denies otalgia, eye pain, sore throat, chest pain, dyspnea, myalgias, epistaxis.  No known aggravating or relieving factors.  He has been treating his symptoms with Mucinex with minimal improvement.  Sick contacts include his son and another family member who were ill over the last week with similar symptoms.  He is not up-to-date on his flu immunization.  The history is provided by the patient. No language interpreter was used.    Past Medical History:  Diagnosis Date  . Back pain   . Gastritis     There are no active problems to display for this patient.   No past surgical history on file.      Home Medications    Prior to Admission medications   Medication Sig Start Date End Date Taking? Authorizing Provider  cyclobenzaprine (FLEXERIL) 5 MG tablet Take 1 tablet (5 mg total) by mouth at bedtime as needed. 07/16/17   Caccavale, Sophia, PA-C  ibuprofen (ADVIL,MOTRIN) 600 MG tablet Take 1 tablet (600 mg total) by mouth every 6 (six) hours as needed. 01/17/18   Royer Cristobal A, PA-C  oxyCODONE-acetaminophen (PERCOCET) 10-325 MG tablet Take 1 tablet by mouth every 8 (eight) hours as needed for pain. 06/26/17   [provider]  promethazine-dextromethorphan (PROMETHAZINE-DM) 6.25-15 MG/5ML syrup Take 5 mLs by mouth 4 (four) times daily as needed for cough. 01/17/18   Izea Livolsi A, PA-C    Family History No  family history on file.  Social History Social History   Tobacco Use  . Smoking status: Current Some Day Smoker    Types: Cigarettes  . Smokeless tobacco: Never Used  Substance Use Topics  . Alcohol use: Yes    Comment: occasion  . Drug use: No     Allergies   Penicillins   Review of Systems Review of Systems  Constitutional: Positive for chills and fever. Negative for activity change.  HENT: Positive for congestion, rhinorrhea and sneezing. Negative for ear pain, facial swelling, nosebleeds, trouble swallowing and voice change.   Eyes: Negative for pain, redness and visual disturbance.  Respiratory: Negative for shortness of breath.   Cardiovascular: Negative for chest pain.  Gastrointestinal: Negative for abdominal pain.  Musculoskeletal: Negative for back pain.  Skin: Negative for rash.     Physical Exam Updated Vital Signs BP 132/62   Pulse 75   Temp 98.8 F (37.1 C)   Resp 16   SpO2 97%   Physical Exam  Constitutional: He appears well-developed.  Well-appearing  HENT:  Head: Normocephalic.  Right Ear: Hearing, tympanic membrane and ear canal normal.  Left Ear: Hearing, tympanic membrane and ear canal normal.  Nose: Mucosal edema and rhinorrhea present. Right sinus exhibits no maxillary sinus tenderness and no frontal sinus tenderness. Left sinus exhibits no maxillary sinus tenderness and no frontal sinus tenderness.  Mouth/Throat: Uvula is midline, oropharynx is clear and moist and mucous membranes are normal. No tonsillar exudate.  Eyes: Conjunctivae are normal.  Neck: Normal range of motion. Neck supple.  No meningismus  Cardiovascular: Normal rate, regular rhythm, normal heart sounds and intact distal pulses. Exam reveals no gallop and no friction rub.  No murmur heard. Pulmonary/Chest: Effort normal and breath sounds normal. No stridor. No respiratory distress. He has no wheezes. He has no rales. He exhibits no tenderness.  Abdominal: Soft. He  exhibits no distension.  Neurological: He is alert.  Skin: Skin is warm and dry.  Psychiatric: His behavior is normal.  Nursing note and vitals reviewed.    ED Treatments / Results  Labs (all labs ordered are listed, but only abnormal results are displayed) Labs Reviewed - No data to display  EKG None  Radiology No results found.  Procedures Procedures (including critical care time)  Medications Ordered in ED Medications  ketorolac (TORADOL) injection 60 mg (60 mg Intramuscular Given 01/17/18 1302)     Initial Impression / Assessment and Plan / ED Course  I have reviewed the triage vital signs and the nursing notes.  Pertinent labs & imaging results that were available during my care of the patient were reviewed by me and considered in my medical decision making (see chart for details).     32 year old male with no pertinent past medical history presenting with 3 days of nasal congestion, sneezing, and rhinorrhea.  Physical exam is unremarkable and he has no tenderness to the bilateral frontal or maxillary sinuses.  He reported subjective fever and chills, but is afebrile with normal vital signs in the ED.  Doubt acute bacterial sinusitis, influenza, preseptal cellulitis, or meningitis.  Will discharge to home with supportive care and education on good hand hygiene to prevent spread of infection.  He is hemodynamically stable in no acute distress.  He is safe for discharge home with outpatient follow-up at this time.  Final Clinical Impressions(s) / ED Diagnoses   Final diagnoses:  Acute nasopharyngitis    ED Discharge Orders         Ordered    promethazine-dextromethorphan (PROMETHAZINE-DM) 6.25-15 MG/5ML syrup  4 times daily PRN     01/17/18 1252    ibuprofen (ADVIL,MOTRIN) 600 MG tablet  Every 6 hours PRN     01/17/18 1252           Fannye Myer A, PA-C 01/17/18 1302    Blane Ohara, MD 01/18/18 2350

## 2018-01-17 NOTE — Discharge Instructions (Addendum)
Thank you for allowing me to care for you today in the Emergency Department.   Your symptoms are consistent with nasopharyngitis.  To improve your symptoms, you can take 600 mg of ibuprofen with food every 6 hours or 650 mg of Tylenol every 6 hours.  You can also alternate between these 2 medications.  They can help with pain and pressure from your headache or from congestion.  Take 5 mls every 6 hours of promethazine DM to help with nasal congestion.  Also attached instructions for how to rinse out your sinuses.  Using a coolmist vaporizer may also help to keep your nose from getting overly dry.  Be sure you are washing your hands frequently after blowing your nose to avoid spread of infection.  Call the number on your discharge paperwork to get established with a primary care provider if your symptoms do not improve in the next week.  Return to the emergency department if you develop stiffness in her neck and you become unable to move it, significant pain with movement of your eyeballs, significant swelling or redness around her eyes, thick, mucus-like drainage from her eyes, or other new, concerning symptoms.

## 2018-01-19 ENCOUNTER — Encounter (HOSPITAL_COMMUNITY): Payer: Self-pay | Admitting: Emergency Medicine

## 2018-01-19 ENCOUNTER — Emergency Department (HOSPITAL_COMMUNITY): Payer: Self-pay

## 2018-01-19 ENCOUNTER — Emergency Department (HOSPITAL_COMMUNITY)
Admission: EM | Admit: 2018-01-19 | Discharge: 2018-01-19 | Disposition: A | Discharge status: Home / Self Care | Payer: Self-pay | Attending: Emergency Medicine | Admitting: Emergency Medicine

## 2018-01-19 DIAGNOSIS — R0981 Nasal congestion: Secondary | ICD-10-CM | POA: Insufficient documentation

## 2018-01-19 DIAGNOSIS — R059 Cough, unspecified: Secondary | ICD-10-CM

## 2018-01-19 DIAGNOSIS — R05 Cough: Secondary | ICD-10-CM | POA: Insufficient documentation

## 2018-01-19 DIAGNOSIS — F17210 Nicotine dependence, cigarettes, uncomplicated: Secondary | ICD-10-CM | POA: Insufficient documentation

## 2018-01-19 DIAGNOSIS — Z79899 Other long term (current) drug therapy: Secondary | ICD-10-CM | POA: Insufficient documentation

## 2018-01-19 MED ORDER — ALBUTEROL SULFATE (2.5 MG/3ML) 0.083% IN NEBU: 5.0000 mg | INHALATION_SOLUTION | Freq: Once | RESPIRATORY_TRACT | Status: DC

## 2018-01-19 NOTE — Discharge Instructions (Signed)
Please have your medication filled from 11/01. Continue to hydrate with fluids and Gatorade as much as possible. You may take tylenol for your fever if needed. Follow up with PCP as needed.

## 2018-01-19 NOTE — ED Notes (Signed)
E-Signature unavailable.

## 2018-01-19 NOTE — ED Provider Notes (Signed)
[facility] EMERGENCY DEPARTMENT Provider Note   CSN: [id number] Arrival date & time: 01/19/18  4098     History   Chief Complaint Chief Complaint  Patient presents with  . Nasal Congestion  . Work note    HPI Manuel Silva is a 32 y.o. male.  32 y/o male with a PMH of Gastritis presents to the ED with a chief complaint of nasal congestion x 4 days. Patient was seen in the ED on 11/01 and discharge home with medication but state he did not fill his prescription and has not felt better. Patient reports chest congestion, rhinorrhea and a productive cough with clear sputum. He also reports shortness when coughing. He has been taking Mucinex and robitussin for his symptoms but reports no relieve. He denies any fever, abdominal pain or other complaints.      Past Medical History:  Diagnosis Date  . Back pain   . Gastritis     There are no active problems to display for this patient.   No past surgical history on file.      Home Medications    Prior to Admission medications   Medication Sig Start Date End Date Taking? Authorizing Provider  cyclobenzaprine (FLEXERIL) 5 MG tablet Take 1 tablet (5 mg total) by mouth at bedtime as needed. 07/16/17   Caccavale, Sophia, PA-C  ibuprofen (ADVIL,MOTRIN) 600 MG tablet Take 1 tablet (600 mg total) by mouth every 6 (six) hours as needed. 01/17/18   McDonald, Mia A, PA-C  oxyCODONE-acetaminophen (PERCOCET) 10-325 MG tablet Take 1 tablet by mouth every 8 (eight) hours as needed for pain. 06/26/17   [provider]  promethazine-dextromethorphan (PROMETHAZINE-DM) 6.25-15 MG/5ML syrup Take 5 mLs by mouth 4 (four) times daily as needed for cough. 01/17/18   McDonald, Mia A, PA-C    Family History No family history on file.  Social History Social History   Tobacco Use  . Smoking status: Current Some Day Smoker    Types: Cigarettes  . Smokeless tobacco: Never Used  Substance Use Topics  . Alcohol use:  Yes    Comment: occasion  . Drug use: No     Allergies   Penicillins   Review of Systems Review of Systems  Constitutional: Negative for chills and fever.  HENT: Positive for rhinorrhea. Negative for sore throat.   Respiratory: Positive for cough, chest tightness and shortness of breath.   Cardiovascular: Negative for chest pain.  Gastrointestinal: Negative for abdominal pain, diarrhea, nausea and vomiting.  Genitourinary: Negative for dysuria and flank pain.  Musculoskeletal: Negative for back pain.  Neurological: Negative for light-headedness and headaches.     Physical Exam Updated Vital Signs BP 139/81   Pulse 70   Temp 98.3 F (36.8 C)   Resp 18   SpO2 97%   Physical Exam  Constitutional: He appears well-developed and well-nourished.  HENT:  Head: Normocephalic and atraumatic.  Mouth/Throat: Uvula is midline and mucous membranes are normal. Posterior oropharyngeal erythema present. No oropharyngeal exudate or posterior oropharyngeal edema. No tonsillar exudate.  Neck: Normal range of motion. Neck supple.  Pulmonary/Chest: He has decreased breath sounds. He has no wheezes. He has rhonchi.  Minimal rhonchi on left > right lung fields.   Nursing note and vitals reviewed.    ED Treatments / Results  Labs (all labs ordered are listed, but only abnormal results are displayed) Labs Reviewed - No data to display  EKG None  Radiology Dg Chest 2 View  Result  Date: 01/19/2018 CLINICAL DATA:  32 year old male with a history sinus disease EXAM: CHEST - 2 VIEW COMPARISON:  07/10/2017, 08/15/2016 FINDINGS: Cardiomediastinal silhouette unchanged in size and contour. No pneumothorax or pleural effusion.  No confluent airspace disease. No displaced fracture. IMPRESSION: Negative for acute cardiopulmonary disease. Electronically Signed   By: Gilmer Mor D.O.   On: 01/19/2018 09:42    Procedures Procedures (including critical care time)  Medications Ordered in  ED Medications - No data to display   Initial Impression / Assessment and Plan / ED Course  I have reviewed the triage vital signs and the nursing notes.  Pertinent labs & imaging results that were available during my care of the patient were reviewed by me and considered in my medical decision making (see chart for details).   Patient presents with cough, nasal congestions.  Seen in the ED 2 days ago and was sent home with prescriptions but states he was unable to refill this as he did not have the income to do so.  Today she presents with the same symptoms but requesting a work note to go back tomorrow as he is not feeling well to work today.  He has been taking Robitussin and Mucinex with no relief in symptoms.  Upon examination there is diminished breath sounds throughout his lung fields, and is a smoker will obtain a chest x-ray to rule out any consolidation.  If negative patient will be discharged home with his work note that he is requesting along with the medication that he was already prescribed 2 days ago.  She understands and agrees with plan, requesting up with use during ED visit will provide this for patient.  He is satting at 97% on room air, no tachycardia, hypoxia, low suspicion for pe.  He does not have any tenderness over the maxillary, frontal sinuses low suspicion for sinusitis process.  I believe this is clearly a viral URI.  DG chest 2 view showed no consolidation, pneumothorax, pleural effusion.  Patient's vitals stable for discharge, patient stable for discharge. Final Clinical Impressions(s) / ED Diagnoses   Final diagnoses:  Nasal congestion  Cough    ED Discharge Orders    None       Claude Manges, PA-C 01/19/18 [id number]    Benjiman Core, MD 01/19/18 1554

## 2018-01-19 NOTE — ED Triage Notes (Signed)
Pt had a work note from here telling him to go back to work today, the 3rd. Pt states he needs a work note extension because he is on day "2 or 3" of congestion and sinus pressure. Pt did not get his prescriptions filled.
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
## 2020-05-02 ENCOUNTER — Ambulatory Visit (HOSPITAL_COMMUNITY)
Admission: EM | Admit: 2020-05-02 | Discharge: 2020-05-02 | Disposition: A | Discharge status: Home / Self Care | Payer: Self-pay | Attending: Internal Medicine | Admitting: Internal Medicine

## 2020-05-02 ENCOUNTER — Other Ambulatory Visit: Payer: Self-pay

## 2020-05-02 ENCOUNTER — Encounter (HOSPITAL_COMMUNITY): Payer: Self-pay | Admitting: *Deleted

## 2020-05-02 DIAGNOSIS — M533 Sacrococcygeal disorders, not elsewhere classified: Secondary | ICD-10-CM

## 2020-05-02 MED ORDER — IBUPROFEN 600 MG PO TABS: 600.0000 mg | ORAL_TABLET | Freq: Four times a day (QID) | ORAL | 0 refills | Status: DC | PRN
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
## 2020-05-02 NOTE — Discharge Instructions (Signed)
Take medications as directed Please call the [facility] occupational health team and make an appointment.

## 2020-05-04 ENCOUNTER — Encounter (HOSPITAL_COMMUNITY): Payer: Self-pay

## 2020-05-04 ENCOUNTER — Other Ambulatory Visit: Payer: Self-pay

## 2020-05-04 ENCOUNTER — Ambulatory Visit (HOSPITAL_COMMUNITY)
Admission: EM | Admit: 2020-05-04 | Discharge: 2020-05-04 | Disposition: A | Discharge status: Home / Self Care | Payer: Self-pay | Attending: Medical Oncology | Admitting: Medical Oncology

## 2020-05-04 DIAGNOSIS — M25521 Pain in right elbow: Secondary | ICD-10-CM

## 2020-05-04 MED ORDER — CYCLOBENZAPRINE HCL 10 MG PO TABS: 5.0000 mg | ORAL_TABLET | Freq: Every evening | ORAL | 0 refills | Status: DC | PRN

## 2020-05-04 NOTE — ED Triage Notes (Signed)
Pt c/o sharp pain in his left arm x 3 days. Pt states it radiates from his shoulder to arm. Pt states he has taken Tylenol for the pain with little relief. He states he is having trouble extending his arm.  Pt denies chest pain.

## 2020-05-04 NOTE — ED Provider Notes (Signed)
[facility]    CSN: [id number] Arrival date & time: 05/04/20  1955      History   Chief Complaint Chief Complaint  Patient presents with  . Arm Pain    Left    HPI Manuel Silva is a 35 y.o. male.   HPI   Arm Pain: Patient reports that he has had left elbow and forearm pain for the past 3 days.  He thinks it may be aggravated from all the hours that he is putting in at work.  He feels that he just needs "a break from work ".  He states that he occasionally will get a tingling sensation that will go from his elbow towards his wrist area.  This mainly occurs when he extends his arm out.  He denies any neck pain, shoulder pain, wrist pain.  No edema, loss of sensation or skin color changes.  He was seen a few days ago at our office and prescribed ibuprofen, Flexeril and oxycodone.   Past Medical History:  Diagnosis Date  . Back pain   . Gastritis     There are no problems to display for this patient.   History reviewed. No pertinent surgical history.     Home Medications    Prior to Admission medications   Medication Sig Start Date End Date Taking? Authorizing Provider  cyclobenzaprine (FLEXERIL) 5 MG tablet Take 1 tablet (5 mg total) by mouth at bedtime as needed. 07/16/17   Caccavale, Sophia, PA-C  ibuprofen (ADVIL) 600 MG tablet Take 1 tablet (600 mg total) by mouth every 6 (six) hours as needed. 05/02/20   LampteyBritta Mccreedy, MD  oxyCODONE-acetaminophen (PERCOCET) 10-325 MG tablet Take 1 tablet by mouth every 8 (eight) hours as needed for pain. 06/26/17   [provider]  promethazine-dextromethorphan (PROMETHAZINE-DM) 6.25-15 MG/5ML syrup Take 5 mLs by mouth 4 (four) times daily as needed for cough. 01/17/18   McDonald, Coral Else, PA-C    Family History History reviewed. No pertinent family history.  Social History Social History   Tobacco Use  . Smoking status: Current Some Day Smoker    Types: Cigarettes  . Smokeless tobacco: Never Used   Vaping Use  . Vaping Use: Never used  Substance Use Topics  . Alcohol use: Yes    Comment: occasion  . Drug use: No     Allergies   Penicillins   Review of Systems Review of Systems  As stated above in HPI Physical Exam Triage Vital Signs ED Triage Vitals  Enc Vitals Group     BP 05/04/20 2008 129/69     Pulse Rate 05/04/20 2008 76     Resp 05/04/20 2008 17     Temp 05/04/20 2008 99.6 F (37.6 C)     Temp Source 05/04/20 2008 Oral     SpO2 05/04/20 2008 99 %     Weight --      Height --      Head Circumference --      Peak Flow --      Pain Score 05/04/20 2007 7     Pain Loc --      Pain Edu? --      Excl. in GC? --    No data found.  Updated Vital Signs BP 129/69 (BP Location: Left Arm)   Pulse 76   Temp 99.6 F (37.6 C) (Oral)   Resp 17   SpO2 99%   Physical Exam Vitals and nursing note reviewed.  Constitutional:      General: He is not in acute distress.    Appearance: He is not ill-appearing, toxic-appearing or diaphoretic.  Musculoskeletal:        General: No swelling, deformity or signs of injury.     Right shoulder: Normal.     Left shoulder: Normal.     Right upper arm: Normal.     Left upper arm: Normal.     Right elbow: Lacerations: mild. Decreased range of motion. Tenderness (mild trhoughout without edema or erythema) present.     Right wrist: Normal.     Left wrist: Normal.     Cervical back: Normal and normal range of motion.     Thoracic back: Normal.     Lumbar back: Normal.     Right lower leg: No edema.     Left lower leg: No edema.  Lymphadenopathy:     Cervical: No cervical adenopathy.  Neurological:     Mental Status: He is alert.      UC Treatments / Results  Labs (all labs ordered are listed, but only abnormal results are displayed) Labs Reviewed - No data to display  EKG   Radiology No results found.  Procedures Procedures (including critical care time)  Medications Ordered in UC Medications - No data to  display  Initial Impression / Assessment and Plan / UC Course  I have reviewed the triage vital signs and the nursing notes.  Pertinent labs & imaging results that were available during my care of the patient were reviewed by me and considered in my medical decision making (see chart for details).     New. Increasing his muscle relaxer and giving him a work note for tomorrow. He can continue his naproxen or ibuprofen PRN.  Discussed red flag signs and symptoms Final Clinical Impressions(s) / UC Diagnoses   Final diagnoses:  None   Discharge Instructions   None    ED Prescriptions    None     PDMP not reviewed this encounter.   Rushie Chestnut, Cordelia Poche 05/04/20 2030

## 2020-05-31 ENCOUNTER — Other Ambulatory Visit: Payer: Self-pay

## 2020-05-31 ENCOUNTER — Encounter (HOSPITAL_COMMUNITY): Payer: Self-pay

## 2020-05-31 ENCOUNTER — Ambulatory Visit (HOSPITAL_COMMUNITY)
Admission: EM | Admit: 2020-05-31 | Discharge: 2020-05-31 | Disposition: A | Discharge status: Home / Self Care | Payer: Self-pay

## 2020-05-31 DIAGNOSIS — R110 Nausea: Secondary | ICD-10-CM

## 2020-05-31 DIAGNOSIS — A084 Viral intestinal infection, unspecified: Secondary | ICD-10-CM

## 2020-05-31 NOTE — ED Provider Notes (Signed)
[facility]    CSN: [id number] Arrival date & time: 05/31/20  1950      History   Chief Complaint Chief Complaint  Patient presents with  . Nausea    HPI Manuel Silva is a 35 y.o. male.   Manuel Silva is 35 year old with complaint of nausea and vomiting.  Reports son recently had stomach bug.  Reports able to hold fluids.  Denies taking any medications at home.  No alleviating or aggravating factors.   Denies any fevers, chest pain, shortness of breath, abdominal pain, or headaches.    ROS: As per HPI, all other pertinent ROS negative   The history is provided by the patient.    Past Medical History:  Diagnosis Date  . Back pain   . Gastritis     There are no problems to display for this patient.   History reviewed. No pertinent surgical history.     Home Medications    Prior to Admission medications   Medication Sig Start Date End Date Taking? Authorizing Provider  cyclobenzaprine (FLEXERIL) 10 MG tablet Take 0.5 tablets (5 mg total) by mouth at bedtime as needed. 05/04/20   Rushie Chestnut, PA-C  ibuprofen (ADVIL) 600 MG tablet Take 1 tablet (600 mg total) by mouth every 6 (six) hours as needed. 05/02/20   LampteyBritta Mccreedy, MD  oxyCODONE-acetaminophen (PERCOCET) 10-325 MG tablet Take 1 tablet by mouth every 8 (eight) hours as needed for pain. 06/26/17   [provider]  promethazine-dextromethorphan (PROMETHAZINE-DM) 6.25-15 MG/5ML syrup Take 5 mLs by mouth 4 (four) times daily as needed for cough. 01/17/18   McDonald, Coral Else, PA-C    Family History History reviewed. No pertinent family history.  Social History Social History   Tobacco Use  . Smoking status: Current Some Day Smoker    Types: Cigarettes  . Smokeless tobacco: Never Used  Vaping Use  . Vaping Use: Never used  Substance Use Topics  . Alcohol use: Yes    Comment: occasion  . Drug use: No     Allergies   Penicillins   Review of Systems Review  of Systems  Gastrointestinal: Positive for nausea and vomiting. Negative for abdominal pain.  Neurological: Negative for headaches.     Physical Exam Triage Vital Signs ED Triage Vitals  Enc Vitals Group     BP 05/31/20 2015 128/77     Pulse Rate 05/31/20 2015 66     Resp 05/31/20 2015 17     Temp 05/31/20 2015 98.1 F (36.7 C)     Temp Source 05/31/20 2015 Oral     SpO2 05/31/20 2015 100 %     Weight --      Height --      Head Circumference --      Peak Flow --      Pain Score 05/31/20 2014 5     Pain Loc --      Pain Edu? --      Excl. in GC? --    No data found.  Updated Vital Signs BP 128/77 (BP Location: Right Arm)   Pulse 66   Temp 98.1 F (36.7 C) (Oral)   Resp 17   SpO2 100%   Visual Acuity Right Eye Distance:   Left Eye Distance:   Bilateral Distance:    Right Eye Near:   Left Eye Near:    Bilateral Near:     Physical Exam Vitals and nursing note reviewed.  Constitutional:      General: He is not in acute distress.    Appearance: Normal appearance. He is not ill-appearing, toxic-appearing or diaphoretic.  HENT:     Head: Normocephalic and atraumatic.  Eyes:     Conjunctiva/sclera: Conjunctivae normal.  Cardiovascular:     Rate and Rhythm: Normal rate.     Pulses: Normal pulses.     Heart sounds: Normal heart sounds.  Pulmonary:     Effort: Pulmonary effort is normal.     Breath sounds: Normal breath sounds.  Abdominal:     General: Abdomen is flat.     Palpations: Abdomen is soft.  Musculoskeletal:        General: Normal range of motion.     Cervical back: Normal range of motion.  Skin:    General: Skin is warm and dry.  Neurological:     General: No focal deficit present.     Mental Status: He is alert and oriented to person, place, and time.  Psychiatric:        Mood and Affect: Mood normal.      UC Treatments / Results  Labs (all labs ordered are listed, but only abnormal results are displayed) Labs Reviewed - No data to  display  EKG   Radiology No results found.  Procedures Procedures (including critical care time)  Medications Ordered in UC Medications - No data to display  Initial Impression / Assessment and Plan / UC Course  I have reviewed the triage vital signs and the nursing notes.  Pertinent labs & imaging results that were available during my care of the patient were reviewed by me and considered in my medical decision making (see chart for details).     Viral Gastroenteritis Encourage fluid and rest.  Try mint or ginger for nausea Work note provided.   Follow up as needed. Final Clinical Impressions(s) / UC Diagnoses   Final diagnoses:  Viral gastroenteritis  Nausea     Discharge Instructions     Drink lots of fluids (frequent, small sips).   Rest.    You can try ginger and mint for nausea.  (Ginger ale, ginger candy, peppermint).   Return or go to the Emergency Department if symptoms worsen or do not improve in the next few days.      ED Prescriptions    None     PDMP not reviewed this encounter.   Ivette Loyal, NP 05/31/20 2032

## 2020-05-31 NOTE — Discharge Instructions (Signed)
Drink lots of fluids (frequent, small sips).   Rest.    You can try ginger and mint for nausea.  (Ginger ale, ginger candy, peppermint).   Return or go to the Emergency Department if symptoms worsen or do not improve in the next few days.

## 2020-05-31 NOTE — ED Triage Notes (Signed)
Pt c/o nausea and vomiting X 2 days.  Pt states his son had a stomach bug.

## 2020-06-07 ENCOUNTER — Encounter (HOSPITAL_COMMUNITY): Payer: Self-pay | Admitting: Emergency Medicine

## 2020-06-07 ENCOUNTER — Ambulatory Visit (HOSPITAL_COMMUNITY)
Admission: EM | Admit: 2020-06-07 | Discharge: 2020-06-07 | Disposition: A | Discharge status: Home / Self Care | Payer: Self-pay | Attending: Family Medicine | Admitting: Family Medicine

## 2020-06-07 ENCOUNTER — Other Ambulatory Visit: Payer: Self-pay

## 2020-06-07 DIAGNOSIS — K529 Noninfective gastroenteritis and colitis, unspecified: Secondary | ICD-10-CM

## 2020-06-07 NOTE — Discharge Instructions (Addendum)
Please do your best to ensure adequate fluid intake in order to avoid dehydration. If you find that you are unable to tolerate drinking fluids regularly please proceed to the Emergency Department for evaluation.  Also, you should return to the hospital if you experience persistent fevers for greater than 1-2 more days, increasing abdominal pain that persists despite medications, persistent diarrhea, dizziness, syncope (fainting), or for any other concerns you may find worrisome.  

## 2020-06-07 NOTE — ED Triage Notes (Signed)
Pt presents with N,V and D. States has been seen multiple times and symptoms are getting better but they have not subsided completely. States unable to eat a regular diet.

## 2020-06-08 NOTE — ED Provider Notes (Signed)
[facility]   [id number] 06/07/20 Arrival Time: 1953  ASSESSMENT & PLAN:  1. Gastroenteritis     No signs of dehydration requiring IVF. Improving. Work note provided. Has nausea medication at home to use as needed.   Follow-up Information    [facility] EMERGENCY DEPARTMENT.   Specialty: Emergency Medicine Why: If symptoms worsen in any way. Contact information: [address] [id number] mc [place] [address] [phone]             Reviewed expectations re: course of current medical issues. Questions answered. Outlined signs and symptoms indicating need for more acute intervention. Patient verbalized understanding. After Visit Summary given.   SUBJECTIVE: History from: patient.  Manuel Silva is a 35 y.o. male who presents with complaint of non-bilious, non-bloody intermittent n/v with non-bloody diarrhea. Onset several days ago. Improving. Initial abd pain has resolved. Afebrile. Tolerating PO fluids today. Mild nausea only. Needs work note.   OBJECTIVE:  Vitals:   06/07/20 2016  BP: 128/84  Pulse: 91  Resp: 17  Temp: 98.4 F (36.9 C)  TempSrc: Oral  SpO2: 99%    General appearance: alert; no distress Oropharynx: moist Lungs: clear to auscultation bilaterally; unlabored Heart: regular Abdomen: soft; non-distended; no significant abdominal tenderness; no guarding or rebound tenderness Back: no CVA tenderness Extremities: no edema; symmetrical with no gross deformities Skin: warm; dry Neurologic: normal gait Psychological: alert and cooperative; normal mood and affect   Allergies  Allergen Reactions  . Penicillins Other (See Comments)    Unknown- was told he was allergic to PCN Has patient had a PCN reaction causing immediate rash, facial/tongue/throat swelling, SOB or lightheadedness with hypotension: unk Has patient had a PCN reaction causing severe rash involving mucus membranes or skin  necrosis: unk Has patient had a PCN reaction that required hospitalization: unk Has patient had a PCN reaction occurring within the last 10 years: unk If all of the above answers are "NO", then may proceed with Cephalosporin use.                                                 Past Medical History:  Diagnosis Date  . Back pain   . Gastritis    Social History   Socioeconomic History  . Marital status: Single    Spouse name: Not on file  . Number of children: Not on file  . Years of education: Not on file  . Highest education level: Not on file  Occupational History  . Not on file  Tobacco Use  . Smoking status: Current Some Day Smoker    Types: Cigarettes  . Smokeless tobacco: Never Used  Vaping Use  . Vaping Use: Never used  Substance and Sexual Activity  . Alcohol use: Yes    Comment: occasion  . Drug use: No  . Sexual activity: Not on file  Other Topics Concern  . Not on file  Social History Narrative  . Not on file   Social Determinants of Health   Financial Resource Strain: Not on file  Food Insecurity: Not on file  Transportation Needs: Not on file  Physical Activity: Not on file  Stress: Not on file  Social Connections: Not on file  Intimate Partner Violence: Not on file   History reviewed. No pertinent family history.   Mardella Layman, MD 06/08/20  0951  

## 2020-09-09 ENCOUNTER — Emergency Department (HOSPITAL_COMMUNITY)
Admission: EM | Admit: 2020-09-09 | Discharge: 2020-09-09 | Disposition: A | Discharge status: Home / Self Care | Payer: Self-pay | Attending: Emergency Medicine | Admitting: Emergency Medicine

## 2020-09-09 DIAGNOSIS — H00014 Hordeolum externum left upper eyelid: Secondary | ICD-10-CM

## 2020-09-09 DIAGNOSIS — H00016 Hordeolum externum left eye, unspecified eyelid: Secondary | ICD-10-CM | POA: Insufficient documentation

## 2020-09-09 DIAGNOSIS — F17210 Nicotine dependence, cigarettes, uncomplicated: Secondary | ICD-10-CM | POA: Insufficient documentation

## 2020-09-09 MED ORDER — CEPHALEXIN 250 MG PO CAPS
500.0000 mg | ORAL_CAPSULE | Freq: Once | ORAL | Status: AC
  Administered 2020-09-09: 500 mg via ORAL
  Filled 2020-09-09: qty 2

## 2020-09-09 MED ORDER — CEPHALEXIN 500 MG PO CAPS: 500.0000 mg | ORAL_CAPSULE | Freq: Three times a day (TID) | ORAL | 0 refills | Status: DC

## 2020-09-09 NOTE — ED Triage Notes (Signed)
Pt c/o eye wound x1wk. Endorses use of warm compresses. States he believes it is a stye.

## 2020-09-09 NOTE — Discharge Instructions (Addendum)
Continue warm compresses 3-4 times daily for 15 minutes periods. Take Keflex as prescribed.   If symptoms are no better in another 3-4 days, call Dr. Jenene Slicker as referred above to schedule an appointment for recheck and further treatment.

## 2020-09-09 NOTE — ED Provider Notes (Signed)
[facility] EMERGENCY DEPARTMENT Provider Note   CSN: [id number] Arrival date & time: 09/09/20  2323     History Chief Complaint  Patient presents with   Eye Problem    Manuel Silva is a 35 y.o. male.  Patient to ED for evaluation of painful swelling to left eye lid x 1 week. No drainage, fever or known injury. He has been applying warm compresses but the area continues to grow. No visual changes.   The history is provided by the patient. No language interpreter was used.  Eye Problem Associated symptoms: no discharge       Past Medical History:  Diagnosis Date   Back pain    Gastritis     There are no problems to display for this patient.   No past surgical history on file.     No family history on file.  Social History   Tobacco Use   Smoking status: Some Days    Pack years: 0.00    Types: Cigarettes   Smokeless tobacco: Never  Vaping Use   Vaping Use: Never used  Substance Use Topics   Alcohol use: Yes    Comment: occasion   Drug use: No    Home Medications Prior to Admission medications   Medication Sig Start Date End Date Taking? Authorizing Provider  cyclobenzaprine (FLEXERIL) 10 MG tablet Take 0.5 tablets (5 mg total) by mouth at bedtime as needed. 05/04/20   Rushie Chestnut, PA-C  ibuprofen (ADVIL) 600 MG tablet Take 1 tablet (600 mg total) by mouth every 6 (six) hours as needed. 05/02/20   LampteyBritta Mccreedy, MD  oxyCODONE-acetaminophen (PERCOCET) 10-325 MG tablet Take 1 tablet by mouth every 8 (eight) hours as needed for pain. 06/26/17   [provider]  promethazine-dextromethorphan (PROMETHAZINE-DM) 6.25-15 MG/5ML syrup Take 5 mLs by mouth 4 (four) times daily as needed for cough. 01/17/18   McDonald, Mia A, PA-C    Allergies    Penicillins  Review of Systems   Review of Systems  Constitutional:  Negative for fever.  Eyes:  Positive for pain. Negative for discharge and visual disturbance.       See  HPI.   Physical Exam Updated Vital Signs BP 112/87 (BP Location: Left Arm)   Pulse (!) 51   Temp (!) 97.5 F (36.4 C) (Oral)   Resp 16   SpO2 98%   Physical Exam Vitals and nursing note reviewed.  Constitutional:      Appearance: He is well-developed.  Eyes:     Comments: Erythematous swelling to lateral, external eye lid on left. No drainage.   Pulmonary:     Effort: Pulmonary effort is normal.  Musculoskeletal:        General: Normal range of motion.     Cervical back: Normal range of motion.  Skin:    General: Skin is warm and dry.  Neurological:     Mental Status: He is alert and oriented to person, place, and time.    ED Results / Procedures / Treatments   Labs (all labs ordered are listed, but only abnormal results are displayed) Labs Reviewed - No data to display  EKG None  Radiology No results found.  Procedures Procedures   Medications Ordered in ED Medications  cephALEXin (KEFLEX) capsule 500 mg (has no administration in time range)    ED Course  I have reviewed the triage vital signs and the nursing notes.  Pertinent labs & imaging results that were  available during my care of the patient were reviewed by me and considered in my medical decision making (see chart for details).    MDM Rules/Calculators/A&P                          Patient to ED with ss/sxs as per HPI.  Exam c/w stye. Will start Keflex. Refer to optho if no better in another week.   Final Clinical Impression(s) / ED Diagnoses Final diagnoses:  None   Stye, left  Rx / DC Orders ED Discharge Orders     None        Elpidio Anis, PA-C 09/09/20 2346    Gilda Crease, MD 09/10/20 [phone]

## 2020-12-15 ENCOUNTER — Other Ambulatory Visit: Payer: Self-pay

## 2020-12-15 ENCOUNTER — Ambulatory Visit (HOSPITAL_COMMUNITY)
Admission: EM | Admit: 2020-12-15 | Discharge: 2020-12-15 | Disposition: A | Discharge status: Home / Self Care | Payer: Self-pay | Attending: Internal Medicine | Admitting: Internal Medicine

## 2020-12-15 ENCOUNTER — Encounter (HOSPITAL_COMMUNITY): Payer: Self-pay | Admitting: Emergency Medicine

## 2020-12-15 DIAGNOSIS — K529 Noninfective gastroenteritis and colitis, unspecified: Secondary | ICD-10-CM

## 2020-12-15 NOTE — Discharge Instructions (Addendum)
Increase oral fluid intake Return to urgent care if you have any other concerns.

## 2020-12-15 NOTE — ED Triage Notes (Signed)
Last time he was in Emergency Department, patient had a stomach virus.  He needs clearance to go back to work

## 2020-12-17 NOTE — ED Provider Notes (Signed)
[facility]    CSN: [id number] Arrival date & time: 12/15/20  1001      History   Chief Complaint Chief Complaint  Patient presents with   Abdominal Pain    HPI Manuel Silva is a 35 y.o. male comes to urgent care requesting clearance to go to work.  He was seen for in the emergency department a couple of days ago.  No nausea and diarrhea.  Symptoms have subsided at this time.  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  Fever or chills.Marland Kitchen   HPI  Past Medical History:  Diagnosis Date   Back pain    Gastritis     There are no problems to display for this patient.   History reviewed. No pertinent surgical history.     Home Medications    Prior to Admission medications   Medication Sig Start Date End Date Taking? Authorizing Provider  ibuprofen (ADVIL) 600 MG tablet Take 1 tablet (600 mg total) by mouth every 6 (six) hours as needed. 05/02/20   Quintan Saldivar, Britta Mccreedy, MD    Family History Family History  Problem Relation Age of Onset   Healthy Mother    Healthy Father     Social History Social History   Tobacco Use   Smoking status: Some Days    Types: Cigarettes   Smokeless tobacco: Never  Vaping Use   Vaping Use: Never used  Substance Use Topics   Alcohol use: Not Currently    Comment: occasion   Drug use: No     Allergies   Penicillins   Review of Systems Review of Systems  All other systems reviewed and are negative.   Physical Exam Triage Vital Signs ED Triage Vitals  Enc Vitals Group     BP 12/15/20 1302 129/69     Pulse Rate 12/15/20 1302 68     Resp 12/15/20 1302 18     Temp 12/15/20 1304 98.4 F (36.9 C)     Temp Source 12/15/20 1302 Oral     SpO2 12/15/20 1302 97 %     Weight --      Height --      Head Circumference --      Peak Flow --      Pain Score 12/15/20 1259 0     Pain Loc --      Pain Edu? --      Excl. in [place]? --    No data found.  Updated Vital Signs BP 129/69 (BP  Location: Right Arm)   Pulse 68   Temp 98.4 F (36.9 C) (Oral)   Resp 18   SpO2 97%   Visual Acuity Right Eye Distance:   Left Eye Distance:   Bilateral Distance:    Right Eye Near:   Left Eye Near:    Bilateral Near:     Physical Exam Vitals and nursing note reviewed.  Constitutional:      General: He is not in acute distress.    Appearance: He is well-developed. He is not ill-appearing.  Abdominal:     General: Bowel sounds are normal.     Palpations: Abdomen is soft.     Tenderness: There is no abdominal tenderness.  Neurological:     Mental Status: He is alert.     UC Treatments / Results  Labs (all labs ordered are listed, but only abnormal results are displayed) Labs Reviewed - No data to display  EKG  Radiology No results found.  Procedures Procedures (including critical care time)  Medications Ordered in UC Medications - No data to display  Initial Impression / Assessment and Plan / UC Course  I have reviewed the triage vital signs and the nursing notes.  Pertinent labs & imaging results that were available during my care of the patient were reviewed by me and considered in my medical decision making (see chart for details).      1.  Acute gastroenteritis, currently resolved: Continue hydration Work note given. Final Clinical Impressions(s) / UC Diagnoses   Final diagnoses:  Noninfectious gastroenteritis, unspecified type     Discharge Instructions      Increase oral fluid intake Return to urgent care if you have any other concerns.   ED Prescriptions   None    PDMP not reviewed this encounter.   Merrilee Jansky, MD 12/17/20 [phone]

## 2021-02-01 ENCOUNTER — Ambulatory Visit (HOSPITAL_COMMUNITY)
Admission: EM | Admit: 2021-02-01 | Discharge: 2021-02-01 | Disposition: A | Discharge status: Home / Self Care | Payer: Self-pay | Attending: Urgent Care | Admitting: Urgent Care

## 2021-02-01 ENCOUNTER — Other Ambulatory Visit: Payer: Self-pay

## 2021-02-01 ENCOUNTER — Encounter (HOSPITAL_COMMUNITY): Payer: Self-pay | Admitting: Emergency Medicine

## 2021-02-01 DIAGNOSIS — A084 Viral intestinal infection, unspecified: Secondary | ICD-10-CM

## 2021-02-01 DIAGNOSIS — R197 Diarrhea, unspecified: Secondary | ICD-10-CM

## 2021-02-01 DIAGNOSIS — R112 Nausea with vomiting, unspecified: Secondary | ICD-10-CM

## 2021-02-01 MED ORDER — LOPERAMIDE HCL 2 MG PO CAPS: 2.0000 mg | ORAL_CAPSULE | Freq: Two times a day (BID) | ORAL | 0 refills | Status: DC | PRN

## 2021-02-01 MED ORDER — ONDANSETRON 8 MG PO TBDP: 8.0000 mg | ORAL_TABLET | Freq: Three times a day (TID) | ORAL | 0 refills | Status: DC | PRN

## 2021-02-01 NOTE — Discharge Instructions (Signed)

## 2021-02-01 NOTE — ED Provider Notes (Signed)
[facility]   MRN: [id number] DOB: August 24, 1985  Subjective:   Manuel Silva is a 35 y.o. male presenting for 1 day history of acute onset diarrhea, vomiting, upset stomach. Tried Pepto Bismol without relief. No cough, chest pain, shortness of breath, hematemesis, bloody diarrhea.  No recent antibiotic use, hospitalizations, long distance travel.  Symptoms started after he ate from a fast food restaurant, [employer].  No current facility-administered medications for this encounter.  Current Outpatient Medications:    ibuprofen (ADVIL) 600 MG tablet, Take 1 tablet (600 mg total) by mouth every 6 (six) hours as needed., Disp: 30 tablet, Rfl: 0   Allergies  Allergen Reactions   Penicillins Other (See Comments)    Unknown- was told he was allergic to PCN Has patient had a PCN reaction causing immediate rash, facial/tongue/throat swelling, SOB or lightheadedness with hypotension: unk Has patient had a PCN reaction causing severe rash involving mucus membranes or skin necrosis: unk Has patient had a PCN reaction that required hospitalization: unk Has patient had a PCN reaction occurring within the last 10 years: unk If all of the above answers are "NO", then may proceed with Cephalosporin use.      Past Medical History:  Diagnosis Date   Back pain    Gastritis      History reviewed. No pertinent surgical history.  Family History  Problem Relation Age of Onset   Healthy Mother    Healthy Father     Social History   Tobacco Use   Smoking status: Some Days    Types: Cigarettes   Smokeless tobacco: Never  Vaping Use   Vaping Use: Never used  Substance Use Topics   Alcohol use: Not Currently    Comment: occasion   Drug use: No    ROS   Objective:   Vitals: BP 136/80 (BP Location: Left Arm)   Pulse 62   Temp 98.3 F (36.8 C) (Oral)   Resp 14   SpO2 100%   Physical Exam Constitutional:      General: He is not in acute distress.     Appearance: Normal appearance. He is well-developed and normal weight. He is not ill-appearing, toxic-appearing or diaphoretic.  HENT:     Head: Normocephalic and atraumatic.     Right Ear: External ear normal.     Left Ear: External ear normal.     Nose: Nose normal.     Mouth/Throat:     Mouth: Mucous membranes are moist.     Pharynx: Oropharynx is clear.  Eyes:     General: No scleral icterus.       Right eye: No discharge.        Left eye: No discharge.     Extraocular Movements: Extraocular movements intact.     Pupils: Pupils are equal, round, and reactive to light.  Cardiovascular:     Rate and Rhythm: Normal rate and regular rhythm.     Heart sounds: Normal heart sounds. No murmur heard.   No friction rub. No gallop.  Pulmonary:     Effort: Pulmonary effort is normal. No respiratory distress.     Breath sounds: Normal breath sounds. No stridor. No wheezing, rhonchi or rales.  Abdominal:     General: Bowel sounds are normal. There is no distension.     Palpations: Abdomen is soft. There is no mass.     Tenderness: There is no abdominal tenderness. There is no right CVA tenderness, left CVA tenderness, guarding  or rebound.  Musculoskeletal:     Cervical back: Normal range of motion.  Neurological:     Mental Status: He is alert and oriented to person, place, and time.  Psychiatric:        Mood and Affect: Mood normal.        Behavior: Behavior normal.        Thought Content: Thought content normal.        Judgment: Judgment normal.    Assessment and Plan :   PDMP not reviewed this encounter.  1. Viral gastroenteritis   2. Nausea vomiting and diarrhea    Will manage for suspected viral gastroenteritis with supportive care.  Recommended patient hydrate well, eat light meals and maintain electrolytes.  Will use Zofran and Imodium for nausea, vomiting and diarrhea. Counseled patient on potential for adverse effects with medications prescribed/recommended today, ER and  return-to-clinic precautions discussed, patient verbalized understanding.    Wallis Bamberg, [place] 02/02/21 [phone]

## 2021-02-01 NOTE — ED Triage Notes (Signed)
Patient c/o diarrhea and emesis that started last night.   Patient endorses onset of symptoms began after eating [employer].   Patient endorses multiple episodes of diarrhea and vomiting.   Patient has taken Pepto Bismol with no relief of symptoms.   Patient needs a work note.

## 2021-02-05 ENCOUNTER — Encounter (HOSPITAL_COMMUNITY): Payer: Self-pay

## 2021-02-05 ENCOUNTER — Other Ambulatory Visit: Payer: Self-pay

## 2021-02-05 ENCOUNTER — Ambulatory Visit (HOSPITAL_COMMUNITY)
Admission: EM | Admit: 2021-02-05 | Discharge: 2021-02-05 | Disposition: A | Discharge status: Home / Self Care | Payer: Self-pay | Attending: Student | Admitting: Student

## 2021-02-05 DIAGNOSIS — Z0289 Encounter for other administrative examinations: Secondary | ICD-10-CM

## 2021-02-05 DIAGNOSIS — A084 Viral intestinal infection, unspecified: Secondary | ICD-10-CM

## 2021-02-05 MED ORDER — ONDANSETRON 4 MG PO TBDP
ORAL_TABLET | ORAL | Status: AC
  Filled 2021-02-05: qty 1

## 2021-02-05 MED ORDER — ONDANSETRON 4 MG PO TBDP
4.0000 mg | ORAL_TABLET | Freq: Once | ORAL | Status: AC
  Administered 2021-02-05: 4 mg via ORAL

## 2021-02-05 NOTE — ED Triage Notes (Signed)
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 States he was here for abdominal pain and was told by the provider will need to be some days out of work, but pt refused.Pt states he did it know he was that sick and missed some days at work.

## 2021-02-05 NOTE — ED Provider Notes (Signed)
[facility]    CSN: [id number] Arrival date & time: 02/05/21  1019      History   Chief Complaint Chief Complaint  Patient presents with   Letter for School/Work   Abdominal Pain    HPI Manuel Silva is a 35 y.o. male presenting for follow-up of viral gastroenteritis.  He was last evaluated at our urgent care on 11/16. Per their note, "1 day history of acute onset diarrhea, vomiting, upset stomach. Tried Pepto Bismol without relief. No cough, chest pain, shortness of breath, hematemesis, bloody diarrhea.  No recent antibiotic use, hospitalizations, long distance travel.  Symptoms started after he ate from a fast food restaurant, [employer]." He was discharged home with imodium and zofran ODT, which he has not picked up due to cost.  States that he is now feeling a lot better, with nausea but no vomiting or diarrhea.  Tolerating fluids and bland foods.  He needs a note to return to work.  HPI  Past Medical History:  Diagnosis Date   Back pain    Gastritis     There are no problems to display for this patient.   History reviewed. No pertinent surgical history.     Home Medications    Prior to Admission medications   Medication Sig Start Date End Date Taking? Authorizing Provider  ibuprofen (ADVIL) 600 MG tablet Take 1 tablet (600 mg total) by mouth every 6 (six) hours as needed. 05/02/20   Lamptey, Myrene Galas, MD  loperamide (IMODIUM) 2 MG capsule Take 1 capsule (2 mg total) by mouth 2 (two) times daily as needed for diarrhea or loose stools. 02/01/21   Jaynee Eagles, PA-C  ondansetron (ZOFRAN-ODT) 8 MG disintegrating tablet Take 1 tablet (8 mg total) by mouth every 8 (eight) hours as needed for nausea or vomiting. 02/01/21   Jaynee Eagles, PA-C    Family History Family History  Problem Relation Age of Onset   Healthy Mother    Healthy Father     Social History Social History   Tobacco Use   Smoking status: Some Days    Types: Cigarettes   Smokeless  tobacco: Never  Vaping Use   Vaping Use: Never used  Substance Use Topics   Alcohol use: Not Currently    Comment: occasion   Drug use: No     Allergies   Penicillins   Review of Systems Review of Systems  Constitutional:  Negative for appetite change, chills, diaphoresis, fever and unexpected weight change.  HENT:  Negative for congestion, ear pain, sinus pressure, sinus pain, sneezing, sore throat and trouble swallowing.   Respiratory:  Negative for cough, chest tightness and shortness of breath.   Cardiovascular:  Negative for chest pain.  Gastrointestinal:  Positive for nausea. Negative for abdominal distention, abdominal pain, anal bleeding, blood in stool, constipation, diarrhea, rectal pain and vomiting.  Genitourinary:  Negative for dysuria, flank pain, frequency and urgency.  Musculoskeletal:  Negative for back pain and myalgias.  Neurological:  Negative for dizziness, light-headedness and headaches.  All other systems reviewed and are negative.   Physical Exam Triage Vital Signs ED Triage Vitals  Enc Vitals Group     BP      Pulse      Resp      Temp      Temp src      SpO2      Weight      Height      Head Circumference  Peak Flow      Pain Score      Pain Loc      Pain Edu?      Excl. in [place]?    No data found.  Updated Vital Signs BP 120/75 (BP Location: Right Arm)   Pulse 88   Temp 98.4 F (36.9 C) (Oral)   Resp 18   SpO2 96%   Visual Acuity Right Eye Distance:   Left Eye Distance:   Bilateral Distance:    Right Eye Near:   Left Eye Near:    Bilateral Near:     Physical Exam Vitals reviewed.  Constitutional:      General: He is not in acute distress.    Appearance: Normal appearance. He is not ill-appearing.  HENT:     Head: Normocephalic and atraumatic.     Mouth/Throat:     Mouth: Mucous membranes are moist.     Comments: Moist mucous membranes Eyes:     Extraocular Movements: Extraocular movements intact.     Pupils:  Pupils are equal, round, and reactive to light.  Cardiovascular:     Rate and Rhythm: Normal rate and regular rhythm.     Heart sounds: Normal heart sounds.  Pulmonary:     Effort: Pulmonary effort is normal.     Breath sounds: Normal breath sounds. No wheezing, rhonchi or rales.  Abdominal:     General: Bowel sounds are normal. There is no distension.     Palpations: Abdomen is soft. There is no mass.     Tenderness: There is no abdominal tenderness. There is no right CVA tenderness, left CVA tenderness, guarding or rebound.  Skin:    General: Skin is warm.     Capillary Refill: Capillary refill takes less than 2 seconds.     Comments: Good skin turgor  Neurological:     General: No focal deficit present.     Mental Status: He is alert and oriented to person, place, and time.  Psychiatric:        Mood and Affect: Mood normal.        Behavior: Behavior normal.     UC Treatments / Results  Labs (all labs ordered are listed, but only abnormal results are displayed) Labs Reviewed - No data to display  EKG   Radiology No results found.  Procedures Procedures (including critical care time)  Medications Ordered in UC Medications  ondansetron (ZOFRAN-ODT) disintegrating tablet 4 mg (has no administration in time range)    Initial Impression / Assessment and Plan / UC Course  I have reviewed the triage vital signs and the nursing notes.  Pertinent labs & imaging results that were available during my care of the patient were reviewed by me and considered in my medical decision making (see chart for details).     This patient is a very pleasant 35 y.o. year old male presenting for work note - clearance to return to work following viral gastroenteritis. Last evaluated this patient at our urgent care 11/16 and diagnosed with food poisoning, sent home with zofran and imodium which he has not picked up yet. Zofran ODT administered during visit. Good hydration, BRAT diet. Appears  well hydrated.   ED return precautions discussed. Patient verbalizes understanding and agreement.   Level 3 for acute illness with prescription drug management (Zofran administered during visit).   Final Clinical Impressions(s) / UC Diagnoses   Final diagnoses:  Viral gastroenteritis  Encounter to obtain excuse from work  Discharge Instructions      -Continue bland foods, lots of water or gatorade, etc.     ED Prescriptions   None    PDMP not reviewed this encounter.   Rhys Martini, PA-C 02/05/21 1132

## 2021-02-05 NOTE — ED Notes (Signed)
Called in lobby, no answered.  °

## 2021-02-05 NOTE — Discharge Instructions (Addendum)
-  Continue bland foods, lots of water or gatorade, etc.

## 2021-02-08 ENCOUNTER — Other Ambulatory Visit: Payer: Self-pay

## 2021-02-08 ENCOUNTER — Ambulatory Visit (HOSPITAL_COMMUNITY)
Admission: EM | Admit: 2021-02-08 | Discharge: 2021-02-08 | Disposition: A | Discharge status: Home / Self Care | Payer: Self-pay | Attending: Family Medicine | Admitting: Family Medicine

## 2021-02-08 ENCOUNTER — Encounter (HOSPITAL_COMMUNITY): Payer: Self-pay

## 2021-02-08 DIAGNOSIS — R112 Nausea with vomiting, unspecified: Secondary | ICD-10-CM

## 2021-02-08 DIAGNOSIS — R197 Diarrhea, unspecified: Secondary | ICD-10-CM

## 2021-02-08 MED ORDER — ONDANSETRON 4 MG PO TBDP: 4.0000 mg | ORAL_TABLET | Freq: Three times a day (TID) | ORAL | 0 refills | Status: DC | PRN

## 2021-02-08 MED ORDER — LOPERAMIDE HCL 2 MG PO CAPS: 2.0000 mg | ORAL_CAPSULE | Freq: Two times a day (BID) | ORAL | 0 refills | Status: DC | PRN

## 2021-02-08 NOTE — ED Triage Notes (Signed)
Pt presents with ongoing generalized abdominal pain X 5 days.

## 2021-02-08 NOTE — ED Provider Notes (Signed)
[facility]    CSN: [id number] Arrival date & time: 02/08/21  0807      History   Chief Complaint Chief Complaint  Patient presents with   Abdominal Pain    HPI Manuel Silva is a 35 y.o. male.   Presenting today with ongoing nausea, vomiting, diarrhea, mild abdominal cramping for the past 5 days or so after eating fast food.  He denies fever, chills, body aches, upper respiratory symptoms, intolerance to p.o., severe abdominal pain, chest pain, shortness of breath.  Was seen several days ago for this, given a work note and states he needs 1 more days extension did his symptoms not fully resolving though they are improving.  Still having some mild nausea, diarrhea but states things are much better than they were.  Has been eating a bland diet and drinking plenty of fluids, not trying any medications over-the-counter thus far.   Past Medical History:  Diagnosis Date   Back pain    Gastritis     There are no problems to display for this patient.   History reviewed. No pertinent surgical history.     Home Medications    Prior to Admission medications   Medication Sig Start Date End Date Taking? Authorizing Provider  ondansetron (ZOFRAN-ODT) 4 MG disintegrating tablet Take 1 tablet (4 mg total) by mouth every 8 (eight) hours as needed for nausea or vomiting. 02/08/21  Yes Volney American, PA-C  ibuprofen (ADVIL) 600 MG tablet Take 1 tablet (600 mg total) by mouth every 6 (six) hours as needed. 05/02/20   Lamptey, Myrene Galas, MD  loperamide (IMODIUM) 2 MG capsule Take 1 capsule (2 mg total) by mouth 2 (two) times daily as needed for diarrhea or loose stools. 02/08/21   Volney American, PA-C  ondansetron (ZOFRAN-ODT) 8 MG disintegrating tablet Take 1 tablet (8 mg total) by mouth every 8 (eight) hours as needed for nausea or vomiting. 02/01/21   Jaynee Eagles, PA-C    Family History Family History  Problem Relation Age of Onset   Healthy Mother     Healthy Father     Social History Social History   Tobacco Use   Smoking status: Some Days    Types: Cigarettes   Smokeless tobacco: Never  Vaping Use   Vaping Use: Never used  Substance Use Topics   Alcohol use: Not Currently    Comment: occasion   Drug use: No     Allergies   Penicillins   Review of Systems Review of Systems Per HPI  Physical Exam Triage Vital Signs ED Triage Vitals [02/08/21 0827]  Enc Vitals Group     BP 122/82     Pulse Rate 73     Resp 18     Temp 98.8 F (37.1 C)     Temp Source Oral     SpO2 97 %     Weight      Height      Head Circumference      Peak Flow      Pain Score 5     Pain Loc      Pain Edu?      Excl. in [place]?    No data found.  Updated Vital Signs BP 122/82 (BP Location: Left Arm)   Pulse 73   Temp 98.8 F (37.1 C) (Oral)   Resp 18   SpO2 97%   Visual Acuity Right Eye Distance:   Left Eye Distance:   Bilateral Distance:  Right Eye Near:   Left Eye Near:    Bilateral Near:     Physical Exam Vitals and nursing note reviewed.  Constitutional:      Appearance: Normal appearance.  HENT:     Head: Atraumatic.     Mouth/Throat:     Mouth: Mucous membranes are moist.  Eyes:     Extraocular Movements: Extraocular movements intact.     Conjunctiva/sclera: Conjunctivae normal.  Cardiovascular:     Rate and Rhythm: Normal rate and regular rhythm.  Pulmonary:     Effort: Pulmonary effort is normal.     Breath sounds: Normal breath sounds.  Abdominal:     General: Bowel sounds are normal. There is no distension.     Palpations: Abdomen is soft.     Tenderness: There is no abdominal tenderness. There is no right CVA tenderness, left CVA tenderness or guarding.  Musculoskeletal:        General: Normal range of motion.     Cervical back: Normal range of motion and neck supple.  Skin:    General: Skin is warm and dry.  Neurological:     General: No focal deficit present.     Mental Status: He is  oriented to person, place, and time.  Psychiatric:        Mood and Affect: Mood normal.        Thought Content: Thought content normal.        Judgment: Judgment normal.     UC Treatments / Results  Labs (all labs ordered are listed, but only abnormal results are displayed) Labs Reviewed - No data to display  EKG   Radiology No results found.  Procedures Procedures (including critical care time)  Medications Ordered in UC Medications - No data to display  Initial Impression / Assessment and Plan / UC Course  I have reviewed the triage vital signs and the nursing notes.  Pertinent labs & imaging results that were available during my care of the patient were reviewed by me and considered in my medical decision making (see chart for details).     Suspect viral versus foodborne GI illness, resolving per patient reports and benign exam and vital signs today.  Will give Imodium, Zofran for symptomatic benefit, discussed brat diet and push fluids, extend work note.  Discussed strict return precautions for worsening symptoms.  Final Clinical Impressions(s) / UC Diagnoses   Final diagnoses:  Nausea vomiting and diarrhea   Discharge Instructions   None    ED Prescriptions     Medication Sig Dispense Auth. Provider   loperamide (IMODIUM) 2 MG capsule Take 1 capsule (2 mg total) by mouth 2 (two) times daily as needed for diarrhea or loose stools. 14 capsule Particia Nearing, PA-C   ondansetron (ZOFRAN-ODT) 4 MG disintegrating tablet Take 1 tablet (4 mg total) by mouth every 8 (eight) hours as needed for nausea or vomiting. 20 tablet Particia Nearing, [place]      PDMP not reviewed this encounter.   Particia Nearing, [place] 02/08/21 1334

## 2021-02-17 ENCOUNTER — Encounter (HOSPITAL_COMMUNITY): Payer: Self-pay

## 2021-02-17 ENCOUNTER — Other Ambulatory Visit: Payer: Self-pay

## 2021-02-17 ENCOUNTER — Ambulatory Visit (HOSPITAL_COMMUNITY)
Admission: EM | Admit: 2021-02-17 | Discharge: 2021-02-17 | Disposition: A | Discharge status: Home / Self Care | Payer: Self-pay

## 2021-02-17 DIAGNOSIS — J069 Acute upper respiratory infection, unspecified: Secondary | ICD-10-CM

## 2021-02-17 NOTE — Discharge Instructions (Addendum)
-  Afrin nasal spray for congestion- only use for 3 days in a row -Take the Imodium (loperamide) up to 4 times daily for diarrhea. -Mucinex during the day, nyquil at night -With a virus, you're typically contagious for 5-7 days, or as long as you're having fevers.

## 2021-02-17 NOTE — ED Triage Notes (Signed)
Pt presents to the office today for cough,diarrhea, sore throat and congestion x 3 days.

## 2021-02-17 NOTE — ED Provider Notes (Signed)
[facility]    CSN: [id number] Arrival date & time: 02/17/21  1014      History   Chief Complaint Chief Complaint  Patient presents with   Generalized Body Aches   Cough   Sore Throat    HPI Manuel Silva is a 35 y.o. male presenting with viral syndrome x4 days. Medical history noncontributory. States everybody at home is sick with a cold. Cough, congestion, bodyaches, sore throat. Nyquil provided some relief. Denies fevers/chills, has not monitored temperature at home. Watery diarrhea. Denies lung ds. Denies SOB, CP, dizziness. Has attempted nyquil with some relief.   HPI  Past Medical History:  Diagnosis Date   Back pain    Gastritis     There are no problems to display for this patient.   History reviewed. No pertinent surgical history.     Home Medications    Prior to Admission medications   Medication Sig Start Date End Date Taking? Authorizing Provider  erythromycin ophthalmic ointment SMARTSIG:1 Application In Eye(s) 4 Times Daily 09/21/20   [provider]  ibuprofen (ADVIL) 600 MG tablet Take 1 tablet (600 mg total) by mouth every 6 (six) hours as needed. 05/02/20   Lamptey, Britta Mccreedy, MD  loperamide (IMODIUM) 2 MG capsule Take 1 capsule (2 mg total) by mouth 2 (two) times daily as needed for diarrhea or loose stools. 02/08/21   Particia Nearing, PA-C    Family History Family History  Problem Relation Age of Onset   Healthy Mother    Healthy Father     Social History Social History   Tobacco Use   Smoking status: Some Days    Types: Cigarettes   Smokeless tobacco: Never  Vaping Use   Vaping Use: Never used  Substance Use Topics   Alcohol use: Not Currently    Comment: occasion   Drug use: No     Allergies   Penicillins   Review of Systems Review of Systems  Constitutional:  Negative for appetite change, chills and fever.  HENT:  Positive for congestion. Negative for ear pain, rhinorrhea, sinus  pressure, sinus pain and sore throat.   Eyes:  Negative for redness and visual disturbance.  Respiratory:  Positive for cough. Negative for chest tightness, shortness of breath and wheezing.   Cardiovascular:  Negative for chest pain and palpitations.  Gastrointestinal:  Negative for abdominal pain, constipation, diarrhea, nausea and vomiting.  Genitourinary:  Negative for dysuria, frequency and urgency.  Musculoskeletal:  Negative for myalgias.  Neurological:  Negative for dizziness, weakness and headaches.  Psychiatric/Behavioral:  Negative for confusion.   All other systems reviewed and are negative.   Physical Exam Triage Vital Signs ED Triage Vitals  Enc Vitals Group     BP 02/17/21 1125 101/81     Pulse Rate 02/17/21 1125 66     Resp 02/17/21 1125 18     Temp 02/17/21 1125 98.7 F (37.1 C)     Temp Source 02/17/21 1125 Oral     SpO2 02/17/21 1125 100 %     Weight --      Height --      Head Circumference --      Peak Flow --      Pain Score 02/17/21 1126 0     Pain Loc --      Pain Edu? --      Excl. in [place]? --    No data found.  Updated Vital Signs BP 101/81   Pulse  66   Temp 98.7 F (37.1 C) (Oral)   Resp 18   SpO2 100%   Visual Acuity Right Eye Distance:   Left Eye Distance:   Bilateral Distance:    Right Eye Near:   Left Eye Near:    Bilateral Near:     Physical Exam Vitals reviewed.  Constitutional:      General: He is not in acute distress.    Appearance: Normal appearance. He is not ill-appearing.  HENT:     Head: Normocephalic and atraumatic.     Right Ear: Tympanic membrane, ear canal and external ear normal. No tenderness. No middle ear effusion. There is no impacted cerumen. Tympanic membrane is not perforated, erythematous, retracted or bulging.     Left Ear: Tympanic membrane, ear canal and external ear normal. No tenderness.  No middle ear effusion. There is no impacted cerumen. Tympanic membrane is not perforated, erythematous, retracted  or bulging.     Nose: Nose normal. No congestion.     Mouth/Throat:     Mouth: Mucous membranes are moist.     Pharynx: Uvula midline. No oropharyngeal exudate or posterior oropharyngeal erythema.  Eyes:     Extraocular Movements: Extraocular movements intact.     Pupils: Pupils are equal, round, and reactive to light.  Cardiovascular:     Rate and Rhythm: Normal rate and regular rhythm.     Heart sounds: Normal heart sounds.  Pulmonary:     Effort: Pulmonary effort is normal.     Breath sounds: Normal breath sounds. No decreased breath sounds, wheezing, rhonchi or rales.  Abdominal:     Palpations: Abdomen is soft.     Tenderness: There is no abdominal tenderness. There is no guarding or rebound.  Lymphadenopathy:     Cervical: No cervical adenopathy.     Right cervical: No superficial cervical adenopathy.    Left cervical: No superficial cervical adenopathy.  Neurological:     General: No focal deficit present.     Mental Status: He is alert and oriented to person, place, and time.  Psychiatric:        Mood and Affect: Mood normal.        Behavior: Behavior normal.        Thought Content: Thought content normal.        Judgment: Judgment normal.     UC Treatments / Results  Labs (all labs ordered are listed, but only abnormal results are displayed) Labs Reviewed - No data to display  EKG   Radiology No results found.  Procedures Procedures (including critical care time)  Medications Ordered in UC Medications - No data to display  Initial Impression / Assessment and Plan / UC Course  I have reviewed the triage vital signs and the nursing notes.  Pertinent labs & imaging results that were available during my care of the patient were reviewed by me and considered in my medical decision making (see chart for details).     This patient is a very pleasant 35 y.o. year old male presenting with viral syndrome. Today this pt is afebrile nontachycardic nontachypneic,  oxygenating well on room air, no wheezes rhonchi or rales.   Covid/influenza testing given duration of symptoms. Rec OTC medications for symptomatic relief.   Work note provided. ED return precautions discussed. Patient verbalizes understanding and agreement.     Final Clinical Impressions(s) / UC Diagnoses   Final diagnoses:  Viral URI with cough     Discharge Instructions      -  Afrin nasal spray for congestion- only use for 3 days in a row -Take the Imodium (loperamide) up to 4 times daily for diarrhea. -Mucinex during the day, nyquil at night -With a virus, you're typically contagious for 5-7 days, or as long as you're having fevers.       ED Prescriptions   None    PDMP not reviewed this encounter.   Hazel Sams, PA-C 02/17/21 1309

## 2021-02-22 ENCOUNTER — Encounter (HOSPITAL_COMMUNITY): Payer: Self-pay

## 2021-02-22 ENCOUNTER — Other Ambulatory Visit: Payer: Self-pay

## 2021-02-22 ENCOUNTER — Ambulatory Visit (HOSPITAL_COMMUNITY)
Admission: EM | Admit: 2021-02-22 | Discharge: 2021-02-22 | Disposition: A | Discharge status: Home / Self Care | Payer: Self-pay | Attending: Family Medicine | Admitting: Family Medicine

## 2021-02-22 DIAGNOSIS — J069 Acute upper respiratory infection, unspecified: Secondary | ICD-10-CM

## 2021-02-22 DIAGNOSIS — Z20828 Contact with and (suspected) exposure to other viral communicable diseases: Secondary | ICD-10-CM

## 2021-02-22 MED ORDER — PROMETHAZINE-DM 6.25-15 MG/5ML PO SYRP: 5.0000 mL | ORAL_SOLUTION | Freq: Four times a day (QID) | ORAL | 0 refills | Status: DC | PRN

## 2021-02-22 MED ORDER — PREDNISONE 20 MG PO TABS: 40.0000 mg | ORAL_TABLET | Freq: Every day | ORAL | 0 refills | Status: DC

## 2021-02-22 NOTE — ED Triage Notes (Signed)
Pt presents with nasal congestion, stomach pain and cough x 1 week.

## 2021-02-23 NOTE — ED Provider Notes (Signed)
[facility]   [id number] 02/22/21 Arrival Time: 1911  ASSESSMENT & PLAN:  1. Viral URI with cough   2. Exposure to influenza    Discussed typical duration of viral illnesses. Work note provided. OTC symptom care as needed.  Meds ordered this encounter  Medications   promethazine-dextromethorphan (PROMETHAZINE-DM) 6.25-15 MG/5ML syrup    Sig: Take 5 mLs by mouth 4 (four) times daily as needed for cough.    Dispense:  118 mL    Refill:  0   predniSONE (DELTASONE) 20 MG tablet    Sig: Take 2 tablets (40 mg total) by mouth daily.    Dispense:  10 tablet    Refill:  0     Follow-up Information     [facility] .   Specialty: Urgent Care Why: If worsening or failing to improve as anticipated. Contact information: [address] [place] [address] [phone]                Reviewed expectations re: course of current medical issues. Questions answered. Outlined signs and symptoms indicating need for more acute intervention. Understanding verbalized. After Visit Summary given.   SUBJECTIVE: History from: patient. Manuel Silva is a 35 y.o. male who reports: nasal congestion, cough, body aches; x several days. Denies: fever and difficulty breathing. Normal PO intake without n/v/d. Family with influenza.  OBJECTIVE:  Vitals:   02/22/21 1953  BP: 121/78  Pulse: 61  Resp: 18  Temp: 98.4 F (36.9 C)  TempSrc: Oral  SpO2: 96%    General appearance: alert; no distress Eyes: PERRLA; EOMI; conjunctiva normal HENT: [place]; AT; with nasal congestion Neck: supple  Lungs: speaks full sentences without difficulty; unlabored; mild bilat wheezing Extremities: no edema Skin: warm and dry Neurologic: normal gait Psychological: alert and cooperative; normal mood and affect   Allergies  Allergen Reactions   Penicillins Other (See Comments)    Unknown- was told he was allergic to PCN Has patient had a  PCN reaction causing immediate rash, facial/tongue/throat swelling, SOB or lightheadedness with hypotension: unk Has patient had a PCN reaction causing severe rash involving mucus membranes or skin necrosis: unk Has patient had a PCN reaction that required hospitalization: unk Has patient had a PCN reaction occurring within the last 10 years: unk If all of the above answers are "NO", then may proceed with Cephalosporin use.      Past Medical History:  Diagnosis Date   Back pain    Gastritis    Social History   Socioeconomic History   Marital status: Single    Spouse name: Not on file   Number of children: Not on file   Years of education: Not on file   Highest education level: Not on file  Occupational History   Not on file  Tobacco Use   Smoking status: Some Days    Types: Cigarettes   Smokeless tobacco: Never  Vaping Use   Vaping Use: Never used  Substance and Sexual Activity   Alcohol use: Not Currently    Comment: occasion   Drug use: No   Sexual activity: Yes  Other Topics Concern   Not on file  Social History Narrative   Not on file   Social Determinants of Health   Financial Resource Strain: Not on file  Food Insecurity: Not on file  Transportation Needs: Not on file  Physical Activity: Not on file  Stress: Not on file  Social Connections: Not on  file  Intimate Partner Violence: Not on file   Family History  Problem Relation Age of Onset   Healthy Mother    Healthy Father    History reviewed. No pertinent surgical history.   Mardella Layman, MD 02/23/21 [phone]

## 2021-03-01 ENCOUNTER — Ambulatory Visit (INDEPENDENT_AMBULATORY_CARE_PROVIDER_SITE_OTHER): Payer: Self-pay

## 2021-03-01 ENCOUNTER — Other Ambulatory Visit: Payer: Self-pay

## 2021-03-01 ENCOUNTER — Ambulatory Visit (HOSPITAL_COMMUNITY)
Admission: EM | Admit: 2021-03-01 | Discharge: 2021-03-01 | Disposition: A | Discharge status: Home / Self Care | Payer: Self-pay | Attending: Urgent Care | Admitting: Urgent Care

## 2021-03-01 ENCOUNTER — Encounter (HOSPITAL_COMMUNITY): Payer: Self-pay | Admitting: Emergency Medicine

## 2021-03-01 DIAGNOSIS — R051 Acute cough: Secondary | ICD-10-CM

## 2021-03-01 DIAGNOSIS — R059 Cough, unspecified: Secondary | ICD-10-CM

## 2021-03-01 DIAGNOSIS — B349 Viral infection, unspecified: Secondary | ICD-10-CM

## 2021-03-01 DIAGNOSIS — F17200 Nicotine dependence, unspecified, uncomplicated: Secondary | ICD-10-CM

## 2021-03-01 DIAGNOSIS — J111 Influenza due to unidentified influenza virus with other respiratory manifestations: Secondary | ICD-10-CM

## 2021-03-01 DIAGNOSIS — R053 Chronic cough: Secondary | ICD-10-CM

## 2021-03-01 MED ORDER — BENZONATATE 100 MG PO CAPS: 100.0000 mg | ORAL_CAPSULE | Freq: Three times a day (TID) | ORAL | 0 refills | Status: DC | PRN

## 2021-03-01 MED ORDER — PROMETHAZINE-DM 6.25-15 MG/5ML PO SYRP: 5.0000 mL | ORAL_SOLUTION | Freq: Every evening | ORAL | 0 refills | Status: DC | PRN

## 2021-03-01 NOTE — ED Provider Notes (Signed)
[name]   MRN: [id number] DOB: 1985-12-16  Subjective:   Manuel Silva is a 35 y.o. male presenting for 1 week history of persistent malaise, coughing, congestion, body aches, fatigue.  Patient is a smoker.  This is his third visit for the same since the end of November.  At his last office visit, he was prescribed an oral prednisone course and cough medication.  Still reports that he feels chest congestion and the same cough.  No respiratory testing was done.  No current facility-administered medications for this encounter.  Current Outpatient Medications:    erythromycin ophthalmic ointment, SMARTSIG:1 Application In Eye(s) 4 Times Daily, Disp: , Rfl:    ibuprofen (ADVIL) 600 MG tablet, Take 1 tablet (600 mg total) by mouth every 6 (six) hours as needed., Disp: 30 tablet, Rfl: 0   loperamide (IMODIUM) 2 MG capsule, Take 1 capsule (2 mg total) by mouth 2 (two) times daily as needed for diarrhea or loose stools., Disp: 14 capsule, Rfl: 0   predniSONE (DELTASONE) 20 MG tablet, Take 2 tablets (40 mg total) by mouth daily., Disp: 10 tablet, Rfl: 0   promethazine-dextromethorphan (PROMETHAZINE-DM) 6.25-15 MG/5ML syrup, Take 5 mLs by mouth 4 (four) times daily as needed for cough., Disp: 118 mL, Rfl: 0   Allergies  Allergen Reactions   Penicillins Other (See Comments)    Unknown- was told he was allergic to PCN Has patient had a PCN reaction causing immediate rash, facial/tongue/throat swelling, SOB or lightheadedness with hypotension: unk Has patient had a PCN reaction causing severe rash involving mucus membranes or skin necrosis: unk Has patient had a PCN reaction that required hospitalization: unk Has patient had a PCN reaction occurring within the last 10 years: unk If all of the above answers are "NO", then may proceed with Cephalosporin use.      Past Medical History:  Diagnosis Date   Back pain    Gastritis      History reviewed. No pertinent  surgical history.  Family History  Problem Relation Age of Onset   Healthy Mother    Healthy Father     Social History   Tobacco Use   Smoking status: Some Days    Types: Cigarettes   Smokeless tobacco: Never  Vaping Use   Vaping Use: Never used  Substance Use Topics   Alcohol use: Not Currently    Comment: occasion   Drug use: No    ROS   Objective:   Vitals: BP 122/79 (BP Location: Right Arm)    Pulse 64    Temp 98.1 F (36.7 C) (Oral)    Resp 17    SpO2 96%   Physical Exam Constitutional:      General: He is not in acute distress.    Appearance: Normal appearance. He is well-developed. He is not ill-appearing, toxic-appearing or diaphoretic.  HENT:     Head: Normocephalic and atraumatic.     Right Ear: External ear normal.     Left Ear: External ear normal.     Nose: Nose normal.     Mouth/Throat:     Mouth: Mucous membranes are moist.     Pharynx: Oropharynx is clear.  Eyes:     General: No scleral icterus.    Extraocular Movements: Extraocular movements intact.     Pupils: Pupils are equal, round, and reactive to light.  Cardiovascular:     Rate and Rhythm: Normal rate and regular rhythm.     Heart sounds:  Normal heart sounds. No murmur heard.   No friction rub. No gallop.  Pulmonary:     Effort: Pulmonary effort is normal. No respiratory distress.     Breath sounds: Normal breath sounds. No stridor. No wheezing, rhonchi or rales.  Neurological:     Mental Status: He is alert and oriented to person, place, and time.  Psychiatric:        Mood and Affect: Mood normal.        Behavior: Behavior normal.        Thought Content: Thought content normal.   DG Chest 2 View  Result Date: 03/01/2021 CLINICAL DATA:  Cough and congestion EXAM: CHEST - 2 VIEW COMPARISON:  01/19/2018 FINDINGS: The heart size and mediastinal contours are within normal limits. Both lungs are clear. The visualized skeletal structures are unremarkable. IMPRESSION: No active  cardiopulmonary disease. Electronically Signed   By: Tiburcio Pea M.D.   On: 03/01/2021 09:19    Assessment and Plan :   PDMP not reviewed this encounter.  1. Acute viral syndrome   2. Acute cough   3. Smoker   4. Persistent cough    Patient has a clear cardiopulmonary exam, negative chest x-ray.  At this stage I will defer any testing as well for influenza and COVID for the same reasons.  Recommended continued supportive care.  Advised against a repeat steroid course.  Provided patient with a note at his request disclosing his medical information. Counseled patient on potential for adverse effects with medications prescribed/recommended today, ER and return-to-clinic precautions discussed, patient verbalized understanding.    Wallis Bamberg, [place] 03/01/21 [phone]

## 2021-03-01 NOTE — ED Triage Notes (Signed)
Pt having cough, congestion, body aches, fatigue for week.

## 2021-04-12 ENCOUNTER — Ambulatory Visit (HOSPITAL_COMMUNITY)
Admission: EM | Admit: 2021-04-12 | Discharge: 2021-04-12 | Disposition: A | Discharge status: Home / Self Care | Payer: Self-pay | Attending: Family Medicine | Admitting: Family Medicine

## 2021-04-12 ENCOUNTER — Encounter (HOSPITAL_COMMUNITY): Payer: Self-pay | Admitting: Emergency Medicine

## 2021-04-12 ENCOUNTER — Other Ambulatory Visit: Payer: Self-pay

## 2021-04-12 DIAGNOSIS — Z20822 Contact with and (suspected) exposure to covid-19: Secondary | ICD-10-CM | POA: Insufficient documentation

## 2021-04-12 DIAGNOSIS — J069 Acute upper respiratory infection, unspecified: Secondary | ICD-10-CM | POA: Insufficient documentation

## 2021-04-12 NOTE — ED Provider Notes (Signed)
[place]    CSN: [id number] Arrival date & time: 04/12/21  1916      History   Chief Complaint Chief Complaint  Patient presents with   Nasal Congestion   Headache    HPI Manuel Silva is a 36 y.o. male.    Headache Here for a history of congestion and rhinorrhea, fatigue, headache and sneezing.  Symptoms began yesterday.  No vomiting or diarrhea.  No fever noted Past Medical History:  Diagnosis Date   Back pain    Gastritis     There are no problems to display for this patient.   History reviewed. No pertinent surgical history.     Home Medications    Prior to Admission medications   Medication Sig Start Date End Date Taking? Authorizing Provider  benzonatate (TESSALON) 100 MG capsule Take 1-2 capsules (100-200 mg total) by mouth 3 (three) times daily as needed for cough. 03/01/21   Jaynee Eagles, PA-C  erythromycin ophthalmic ointment SMARTSIG:1 Application In Eye(s) 4 Times Daily 09/21/20   [provider]  ibuprofen (ADVIL) 600 MG tablet Take 1 tablet (600 mg total) by mouth every 6 (six) hours as needed. 05/02/20   Lamptey, Myrene Galas, MD  loperamide (IMODIUM) 2 MG capsule Take 1 capsule (2 mg total) by mouth 2 (two) times daily as needed for diarrhea or loose stools. 02/08/21   Volney American, PA-C  promethazine-dextromethorphan (PROMETHAZINE-DM) 6.25-15 MG/5ML syrup Take 5 mLs by mouth at bedtime as needed for cough. 03/01/21   Jaynee Eagles, PA-C    Family History Family History  Problem Relation Age of Onset   Healthy Mother    Healthy Father     Social History Social History   Tobacco Use   Smoking status: Some Days    Types: Cigarettes   Smokeless tobacco: Never  Vaping Use   Vaping Use: Never used  Substance Use Topics   Alcohol use: Not Currently    Comment: occasion   Drug use: No     Allergies   Penicillins   Review of Systems Review of Systems  Neurological:  Positive for headaches.     Physical Exam Triage Vital Signs ED Triage Vitals  Enc Vitals Group     BP 04/12/21 1937 136/82     Pulse Rate 04/12/21 1937 61     Resp 04/12/21 1937 16     Temp 04/12/21 1937 98.2 F (36.8 C)     Temp Source 04/12/21 1937 Oral     SpO2 04/12/21 1937 96 %     Weight --      Height --      Head Circumference --      Peak Flow --      Pain Score 04/12/21 1936 5     Pain Loc --      Pain Edu? --      Excl. in [place]? --    No data found.  Updated Vital Signs BP 136/82    Pulse 61    Temp 98.2 F (36.8 C) (Oral)    Resp 16    SpO2 96%   Visual Acuity Right Eye Distance:   Left Eye Distance:   Bilateral Distance:    Right Eye Near:   Left Eye Near:    Bilateral Near:     Physical Exam Vitals reviewed.  Constitutional:      General: He is not in acute distress.    Appearance: He is not toxic-appearing.  HENT:  Right Ear: Tympanic membrane and ear canal normal.     Left Ear: Tympanic membrane and ear canal normal.     Nose: Congestion present.     Mouth/Throat:     Mouth: Mucous membranes are moist.     Pharynx: No oropharyngeal exudate or posterior oropharyngeal erythema.  Eyes:     Extraocular Movements: Extraocular movements intact.     Conjunctiva/sclera: Conjunctivae normal.     Pupils: Pupils are equal, round, and reactive to light.  Cardiovascular:     Rate and Rhythm: Normal rate and regular rhythm.     Heart sounds: No murmur heard. Pulmonary:     Effort: Pulmonary effort is normal.     Breath sounds: No stridor. No wheezing, rhonchi or rales.  Musculoskeletal:     Cervical back: Neck supple.  Lymphadenopathy:     Cervical: No cervical adenopathy.  Skin:    Capillary Refill: Capillary refill takes less than 2 seconds.     Coloration: Skin is not jaundiced or pale.  Neurological:     General: No focal deficit present.     Mental Status: He is alert and oriented to person, place, and time.  Psychiatric:        Behavior: Behavior normal.      UC Treatments / Results  Labs (all labs ordered are listed, but only abnormal results are displayed) Labs Reviewed  SARS CORONAVIRUS 2 (TAT 6-24 HRS)    EKG   Radiology No results found.  Procedures Procedures (including critical care time)  Medications Ordered in UC Medications - No data to display  Initial Impression / Assessment and Plan / UC Course  I have reviewed the triage vital signs and the nursing notes.  Pertinent labs & imaging results that were available during my care of the patient were reviewed by me and considered in my medical decision making (see chart for details).     His BMI is 28.7, and his eGFR is normal. If COVID positive, he should be rx'd Paxlovid Final Clinical Impressions(s) / UC Diagnoses   Final diagnoses:  Viral upper respiratory tract infection     Discharge Instructions       You have been swabbed for COVID, and the test will result in the next 24 hours. Our staff will call you if positive. If the test is positive, you should quarantine for 5 days.   Tried DayQuil or NyQuil for your congestion and headache symptoms.  Make sure you are drinking enough fluids.     ED Prescriptions   None    PDMP not reviewed this encounter.   Barrett Henle, MD 04/12/21 2023

## 2021-04-12 NOTE — ED Triage Notes (Signed)
PT reports congestion, fatigue, headache, started yesterday.

## 2021-04-12 NOTE — Discharge Instructions (Addendum)
°  You have been swabbed for COVID, and the test will result in the next 24 hours. Our staff will call you if positive. If the test is positive, you should quarantine for 5 days.   Tried DayQuil or NyQuil for your congestion and headache symptoms.  Make sure you are drinking enough fluids.

## 2021-04-13 LAB — SARS CORONAVIRUS 2 (TAT 6-24 HRS): SARS Coronavirus 2: NEGATIVE

## 2021-04-26 ENCOUNTER — Ambulatory Visit (HOSPITAL_COMMUNITY)
Admission: EM | Admit: 2021-04-26 | Discharge: 2021-04-26 | Disposition: A | Discharge status: Home / Self Care | Payer: Self-pay | Attending: Emergency Medicine | Admitting: Emergency Medicine

## 2021-04-26 ENCOUNTER — Encounter (HOSPITAL_COMMUNITY): Payer: Self-pay | Admitting: Emergency Medicine

## 2021-04-26 ENCOUNTER — Other Ambulatory Visit: Payer: Self-pay

## 2021-04-26 DIAGNOSIS — G43519 Persistent migraine aura without cerebral infarction, intractable, without status migrainosus: Secondary | ICD-10-CM

## 2021-04-26 MED ORDER — DIPHENHYDRAMINE HCL 25 MG PO CAPS
ORAL_CAPSULE | ORAL | Status: AC
  Filled 2021-04-26: qty 1

## 2021-04-26 MED ORDER — ONDANSETRON HCL 4 MG/2ML IJ SOLN
4.0000 mg | Freq: Once | INTRAMUSCULAR | Status: AC
  Administered 2021-04-26: 4 mg via INTRAMUSCULAR

## 2021-04-26 MED ORDER — KETOROLAC TROMETHAMINE 60 MG/2ML IM SOLN
60.0000 mg | Freq: Once | INTRAMUSCULAR | Status: AC
  Administered 2021-04-26: 60 mg via INTRAMUSCULAR

## 2021-04-26 MED ORDER — DIPHENHYDRAMINE HCL 25 MG PO CAPS
25.0000 mg | ORAL_CAPSULE | Freq: Once | ORAL | Status: AC
  Administered 2021-04-26: 25 mg via ORAL

## 2021-04-26 MED ORDER — KETOROLAC TROMETHAMINE 60 MG/2ML IM SOLN
INTRAMUSCULAR | Status: AC
  Filled 2021-04-26: qty 2

## 2021-04-26 MED ORDER — ONDANSETRON HCL 4 MG/2ML IJ SOLN
INTRAMUSCULAR | Status: AC
  Filled 2021-04-26: qty 2

## 2021-04-26 NOTE — Discharge Instructions (Addendum)
For migraine intervention, you are provided with an injection of ketorolac, diphenhydramine and Zofran.  For migraine prevention, please speak to your primary care provider about preventative therapy such as beta-blockers, calcium channel blockers, tricyclic antidepressants, and monthly injectables.  Thank you for visiting urgent care today.  Please let us know if this did not resolve your migraine.

## 2021-04-26 NOTE — ED Triage Notes (Signed)
Pt c/o anterior migraine since yesterday with nausea.

## 2021-04-26 NOTE — ED Provider Notes (Signed)
[name]    CSN: [id number] Arrival date & time: 04/26/21  1941    HISTORY   Chief Complaint  Patient presents with   Migraine   HPI Manuel Silva is a 36 y.o. male. Patient complains of anterior migraine since yesterday accompanied by nausea.  Patient states that when he was younger he had migraines several times a week, then for the past 5 or 6 years had very few, states lately he feels like he is been under a lot more stress and has been having migraines almost every day.  Patient states he has an upcoming appointment with his primary care provider.  Patient states he has not tried medications for symptoms as of yet.  The history is provided by the patient.  Past Medical History:  Diagnosis Date   Back pain    Gastritis    There are no problems to display for this patient.  History reviewed. No pertinent surgical history.  Home Medications    Prior to Admission medications   Medication Sig Start Date End Date Taking? Authorizing Provider  benzonatate (TESSALON) 100 MG capsule Take 1-2 capsules (100-200 mg total) by mouth 3 (three) times daily as needed for cough. 03/01/21   Jaynee Eagles, PA-C  erythromycin ophthalmic ointment SMARTSIG:1 Application In Eye(s) 4 Times Daily 09/21/20   [provider]  ibuprofen (ADVIL) 600 MG tablet Take 1 tablet (600 mg total) by mouth every 6 (six) hours as needed. 05/02/20   Lamptey, Myrene Galas, MD  loperamide (IMODIUM) 2 MG capsule Take 1 capsule (2 mg total) by mouth 2 (two) times daily as needed for diarrhea or loose stools. 02/08/21   Volney American, PA-C  promethazine-dextromethorphan (PROMETHAZINE-DM) 6.25-15 MG/5ML syrup Take 5 mLs by mouth at bedtime as needed for cough. 03/01/21   Jaynee Eagles, PA-C    Family History Family History  Problem Relation Age of Onset   Healthy Mother    Healthy Father    Social History Social History   Tobacco Use   Smoking status: Some Days    Types:  Cigarettes   Smokeless tobacco: Never  Vaping Use   Vaping Use: Never used  Substance Use Topics   Alcohol use: Not Currently    Comment: occasion   Drug use: No   Allergies   Penicillins  Review of Systems Review of Systems Pertinent findings noted in history of present illness.   Physical Exam Triage Vital Signs ED Triage Vitals  Enc Vitals Group     BP 01/13/21 0827 (!) 147/82     Pulse Rate 01/13/21 0827 72     Resp 01/13/21 0827 18     Temp 01/13/21 0827 98.3 F (36.8 C)     Temp Source 01/13/21 0827 Oral     SpO2 01/13/21 0827 98 %     Weight --      Height --      Head Circumference --      Peak Flow --      Pain Score 01/13/21 0826 5     Pain Loc --      Pain Edu? --      Excl. in [place]? --   No data found.  Updated Vital Signs BP 112/71 (BP Location: Left Arm)    Pulse (!) 56    Temp 97.9 F (36.6 C) (Oral)    Resp 15    SpO2 95%   Physical Exam Vitals and nursing note reviewed.  Constitutional:  General: He is not in acute distress.    Appearance: Normal appearance. He is not ill-appearing.  HENT:     Head: Normocephalic and atraumatic.  Eyes:     General: Lids are normal.        Right eye: No discharge.        Left eye: No discharge.     Extraocular Movements: Extraocular movements intact.     Conjunctiva/sclera: Conjunctivae normal.     Right eye: Right conjunctiva is not injected.     Left eye: Left conjunctiva is not injected.  Neck:     Trachea: Trachea and phonation normal.  Cardiovascular:     Rate and Rhythm: Normal rate and regular rhythm.     Pulses: Normal pulses.     Heart sounds: Normal heart sounds. No murmur heard.   No friction rub. No gallop.  Pulmonary:     Effort: Pulmonary effort is normal. No accessory muscle usage, prolonged expiration or respiratory distress.     Breath sounds: Normal breath sounds. No stridor, decreased air movement or transmitted upper airway sounds. No decreased breath sounds, wheezing, rhonchi or  rales.  Chest:     Chest wall: No tenderness.  Musculoskeletal:        General: Normal range of motion.     Cervical back: Normal range of motion and neck supple. Normal range of motion.  Lymphadenopathy:     Cervical: No cervical adenopathy.  Skin:    General: Skin is warm and dry.     Findings: No erythema or rash.  Neurological:     General: No focal deficit present.     Mental Status: He is alert and oriented to person, place, and time. Mental status is at baseline.     Cranial Nerves: No cranial nerve deficit.     Sensory: No sensory deficit.     Motor: No weakness.     Coordination: Coordination normal.     Gait: Gait normal.     Deep Tendon Reflexes: Reflexes normal.  Psychiatric:        Mood and Affect: Mood normal.        Behavior: Behavior normal.    Visual Acuity Right Eye Distance:   Left Eye Distance:   Bilateral Distance:    Right Eye Near:   Left Eye Near:    Bilateral Near:     UC Couse / Diagnostics / Procedures:    EKG  Radiology No results found.  Procedures Procedures (including critical care time)  UC Diagnoses / Final Clinical Impressions(s)   I have reviewed the triage vital signs and the nursing notes.  Pertinent labs & imaging results that were available during my care of the patient were reviewed by me and considered in my medical decision making (see chart for details).    Final diagnoses:  Migraine aura, persistent, intractable   Patient provided with injection of ketorolac, injection of Zofran and Benadryl.  Patient advised to discuss migraine prevention with his primary care provider at his upcoming appointment.  Return precautions advised.  ED Prescriptions   None    PDMP not reviewed this encounter.  Pending results:  Labs Reviewed - No data to display  Medications Ordered in UC: Medications  ketorolac (TORADOL) injection 60 mg (60 mg Intramuscular Given 04/26/21 2003)  ondansetron (ZOFRAN) injection 4 mg (4 mg  Intramuscular Given 04/26/21 2000)  diphenhydrAMINE (BENADRYL) capsule 25 mg (25 mg Oral Given 04/26/21 1959)    Disposition Upon Discharge:  Condition: stable  for discharge home Home: take medications as prescribed; routine discharge instructions as discussed; follow up as advised.  Patient presented with an acute illness with associated systemic symptoms and significant discomfort requiring urgent management. In my opinion, this is a condition that a prudent lay person (someone who possesses an average knowledge of health and medicine) may potentially expect to result in complications if not addressed urgently such as respiratory distress, impairment of bodily function or dysfunction of bodily organs.   Routine symptom specific, illness specific and/or disease specific instructions were discussed with the patient and/or caregiver at length.   As such, the patient has been evaluated and assessed, work-up was performed and treatment was provided in alignment with urgent care protocols and evidence based medicine.  Patient/parent/caregiver has been advised that the patient may require follow up for further testing and treatment if the symptoms continue in spite of treatment, as clinically indicated and appropriate.  If the patient was tested for COVID-19, Influenza and/or RSV, then the patient/parent/guardian was advised to isolate at home pending the results of his/her diagnostic coronavirus test and potentially longer if theyre positive. I have also advised pt that if his/her COVID-19 test returns positive, it's recommended to self-isolate for at least 10 days after symptoms first appeared AND until fever-free for 24 hours without fever reducer AND other symptoms have improved or resolved. Discussed self-isolation recommendations as well as instructions for household member/close contacts as per the [facility] and [place] DHHS, and also gave patient the [place] packet with this information.  Patient/parent/caregiver  has been advised to return to the [facility] or PCP in 3-5 days if no better; to PCP or the Emergency Department if new signs and symptoms develop, or if the current signs or symptoms continue to change or worsen for further workup, evaluation and treatment as clinically indicated and appropriate  The patient will follow up with their current PCP if and as advised. If the patient does not currently have a PCP we will assist them in obtaining one.   The patient may need specialty follow up if the symptoms continue, in spite of conservative treatment and management, for further workup, evaluation, consultation and treatment as clinically indicated and appropriate.   Patient/parent/caregiver verbalized understanding and agreement of plan as discussed.  All questions were addressed during visit.  Please see discharge instructions below for further details of plan.  Discharge Instructions:   Discharge Instructions      For migraine intervention, you are provided with an injection of ketorolac, diphenhydramine and Zofran.  For migraine prevention, please speak to your primary care provider about preventative therapy such as beta-blockers, calcium channel blockers, tricyclic antidepressants, and monthly injectables.  Thank you for visiting urgent care today.  Please let us know if this did not resolve your migraine.    This office note has been dictated using Museum/gallery curator.  Unfortunately, and despite my best efforts, this method of dictation can sometimes lead to occasional typographical or grammatical errors.  I apologize in advance if this occurs.     Lynden Oxford Scales, [place] 04/26/21 2034

## 2021-07-19 ENCOUNTER — Ambulatory Visit (HOSPITAL_COMMUNITY)
Admission: EM | Admit: 2021-07-19 | Discharge: 2021-07-19 | Disposition: A | Discharge status: Home / Self Care | Payer: Self-pay | Attending: Family Medicine | Admitting: Family Medicine

## 2021-07-19 ENCOUNTER — Encounter (HOSPITAL_COMMUNITY): Payer: Self-pay | Admitting: Emergency Medicine

## 2021-07-19 DIAGNOSIS — L03011 Cellulitis of right finger: Secondary | ICD-10-CM

## 2021-07-19 NOTE — ED Triage Notes (Signed)
Pt reports that he bites his nails. Believes bit to far down bc having swelling and pain to right middle finger around nail for couple days. Went to PCP yesterday who tried to drain but nothing came out. Was put on antibiotics yesterday.  ?

## 2021-07-22 NOTE — ED Provider Notes (Signed)
?  Manuel Silva ? ? ?[id number] ?07/19/21 Arrival Time: 1955 ? ?ASSESSMENT & PLAN: ? ?1. Paronychia of finger, right   ? ?Incision and Drainage Procedure Note ? ?Anesthesia: PainEaze ? ?Procedure Details  ?The procedure, risks and complications have been discussed in detail (including, but not limited to pain and bleeding) with the patient. ? ?The skin induration was prepped and draped in the usual fashion. ?After adequate local anesthesia, I&D with a #11 blade was performed on the right distal 4th finger nailfold with purulent drainage. ? ?EBL: minimal ?Drains: none ?Packing: n/a ?Condition: Tolerated procedure well ?Complications: none. ? ?Will finish antibiotic prev Rx. ?Has oxycodone at home. ? ?May f/u as needed. See AVS for d/c instructions. ? ?Reviewed expectations re: course of current medical issues. Questions answered. ?Outlined signs and symptoms indicating need for more acute intervention. ?Patient verbalized understanding. ?After Visit Summary given. ? ? ?SUBJECTIVE: ? ?Manuel Silva is a 36 y.o. male who presents with a possible infection of his R distal 4th finger; reports attempt at I&D yesterday without success; placed on antibiotic. Present for few days; now very painful. Bites nails. ?Afebrile. ? ? ?OBJECTIVE: ? ?Vitals:  ? 07/19/21 2002  ?BP: 122/71  ?Pulse: 70  ?Resp: 17  ?Temp: 97.6 ?F (36.4 ?C)  ?TempSrc: Oral  ?SpO2: 100%  ?  ? ?General appearance: alert; no distress ?RUE: approx 0.5 cm induration of his R distal finger at nailfild; tender to touch; no active drainage or bleeding ?Psychological: alert and cooperative; normal mood and affect ? ?Allergies  ?Allergen Reactions  ? Penicillins Other (See Comments)  ?  Unknown- was told he was allergic to PCN ?Has patient had a PCN reaction causing immediate rash, facial/tongue/throat swelling, SOB or lightheadedness with hypotension: unk ?Has patient had a PCN reaction causing severe rash involving mucus membranes or skin necrosis:  unk ?Has patient had a PCN reaction that required hospitalization: unk ?Has patient had a PCN reaction occurring within the last 10 years: unk ?If all of the above answers are "NO", then may proceed with Cephalosporin use. ? ?  ? ? ?Past Medical History:  ?Diagnosis Date  ? Back pain   ? Gastritis   ? ?Social History  ? ?Socioeconomic History  ? Marital status: Single  ?  Spouse name: Not on file  ? Number of children: Not on file  ? Years of education: Not on file  ? Highest education level: Not on file  ?Occupational History  ? Not on file  ?Tobacco Use  ? Smoking status: Some Days  ?  Types: Cigarettes  ? Smokeless tobacco: Never  ?Vaping Use  ? Vaping Use: Never used  ?Substance and Sexual Activity  ? Alcohol use: Not Currently  ?  Comment: occasion  ? Drug use: No  ? Sexual activity: Yes  ?Other Topics Concern  ? Not on file  ?Social History Narrative  ? Not on file  ? ?Social Determinants of Health  ? ?Financial Resource Strain: Not on file  ?Food Insecurity: Not on file  ?Transportation Needs: Not on file  ?Physical Activity: Not on file  ?Stress: Not on file  ?Social Connections: Not on file  ? ?Family History  ?Problem Relation Age of Onset  ? Healthy Mother   ? Healthy Father   ? ?History reviewed. No pertinent surgical history. ? ? ? ? ? ? ? ?  ?Vanessa Kick, MD ?07/22/21 [phone] ? ?

## 2021-10-31 ENCOUNTER — Ambulatory Visit (INDEPENDENT_AMBULATORY_CARE_PROVIDER_SITE_OTHER): Payer: Self-pay

## 2021-10-31 ENCOUNTER — Ambulatory Visit (HOSPITAL_COMMUNITY)
Admission: EM | Admit: 2021-10-31 | Discharge: 2021-10-31 | Disposition: A | Discharge status: Home / Self Care | Payer: Self-pay | Attending: Family Medicine | Admitting: Family Medicine

## 2021-10-31 ENCOUNTER — Encounter (HOSPITAL_COMMUNITY): Payer: Self-pay

## 2021-10-31 DIAGNOSIS — M545 Low back pain, unspecified: Secondary | ICD-10-CM

## 2021-10-31 DIAGNOSIS — M25561 Pain in right knee: Secondary | ICD-10-CM

## 2021-10-31 DIAGNOSIS — M542 Cervicalgia: Secondary | ICD-10-CM

## 2021-10-31 DIAGNOSIS — M25531 Pain in right wrist: Secondary | ICD-10-CM

## 2021-10-31 MED ORDER — KETOROLAC TROMETHAMINE 30 MG/ML IJ SOLN
30.0000 mg | Freq: Once | INTRAMUSCULAR | Status: AC
  Administered 2021-10-31: 30 mg via INTRAMUSCULAR

## 2021-10-31 MED ORDER — IBUPROFEN 800 MG PO TABS: 800.0000 mg | ORAL_TABLET | Freq: Three times a day (TID) | ORAL | 0 refills | Status: DC | PRN

## 2021-10-31 MED ORDER — KETOROLAC TROMETHAMINE 30 MG/ML IJ SOLN
INTRAMUSCULAR | Status: AC
  Filled 2021-10-31: qty 1

## 2021-10-31 MED ORDER — TIZANIDINE HCL 4 MG PO TABS: 4.0000 mg | ORAL_TABLET | Freq: Three times a day (TID) | ORAL | 0 refills | Status: DC | PRN
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
## 2021-10-31 NOTE — Discharge Instructions (Addendum)
Your x-rays did not show any broken bones.  You have been given a shot of Toradol 30 mg today.  Take ibuprofen 800 mg--1 tab every 8 hours as needed for pain.   Take tizanidine 4 mg--1 every 8 hours as needed for muscle spasms

## 2021-11-19 ENCOUNTER — Encounter (HOSPITAL_COMMUNITY): Payer: Self-pay

## 2021-11-19 ENCOUNTER — Ambulatory Visit (HOSPITAL_COMMUNITY)
Admission: EM | Admit: 2021-11-19 | Discharge: 2021-11-19 | Disposition: A | Discharge status: Home / Self Care | Payer: Self-pay | Attending: Emergency Medicine | Admitting: Emergency Medicine

## 2021-11-19 DIAGNOSIS — M545 Low back pain, unspecified: Secondary | ICD-10-CM

## 2021-11-19 DIAGNOSIS — M546 Pain in thoracic spine: Secondary | ICD-10-CM

## 2021-11-19 MED ORDER — PREDNISONE 20 MG PO TABS: 40.0000 mg | ORAL_TABLET | Freq: Every day | ORAL | 0 refills | Status: DC

## 2021-11-19 MED ORDER — TIZANIDINE HCL 4 MG PO TABS: 4.0000 mg | ORAL_TABLET | Freq: Three times a day (TID) | ORAL | 0 refills | Status: DC | PRN
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
## 2021-11-19 NOTE — Discharge Instructions (Signed)
Your pain is most likely caused by irritation to the muscles or ligaments.   Starting tomorrow take prednisone every morning with food to reduce inflammation which will help with your pain  Use muscle relaxer every 8 hours as needed for additional comfort  You may use heating pad in 15 minute intervals as needed for additional comfort, wyou may find comfort in using ice in 10-15 minutes over affected area  Begin stretching affected area daily for 10 minutes as tolerated to further loosen muscles   When lying down place pillow underneath and between knees for support  Can try sleeping without pillow on firm mattress   Practice good posture: head back, shoulders back, chest forward, pelvis back and weight distributed evenly on both legs  If pain persist after recommended treatment or reoccurs if may be beneficial to follow up with orthopedic specialist for evaluation, this doctor specializes in the bones and can manage your symptoms long-term with options such as but not limited to imaging, medications or physical therapy

## 2021-11-21 ENCOUNTER — Encounter (HOSPITAL_COMMUNITY): Payer: Self-pay | Admitting: *Deleted

## 2021-11-21 ENCOUNTER — Ambulatory Visit (HOSPITAL_COMMUNITY)
Admission: EM | Admit: 2021-11-21 | Discharge: 2021-11-21 | Disposition: A | Discharge status: Home / Self Care | Payer: Self-pay | Attending: Family Medicine | Admitting: Family Medicine

## 2021-11-21 DIAGNOSIS — M545 Low back pain, unspecified: Secondary | ICD-10-CM

## 2021-11-21 NOTE — Discharge Instructions (Addendum)
The prednisone medicine your provider sent in for you should only be about $5 at the pharmacy.  Continue ibuprofen as needed. Keep your MRI appointment for Friday.  I have attached a work note.
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
## 2021-11-24 ENCOUNTER — Ambulatory Visit (HOSPITAL_COMMUNITY)
Admission: EM | Admit: 2021-11-24 | Discharge: 2021-11-24 | Disposition: A | Discharge status: Home / Self Care | Payer: Self-pay

## 2021-11-24 ENCOUNTER — Encounter (HOSPITAL_COMMUNITY): Payer: Self-pay | Admitting: Emergency Medicine

## 2021-11-24 DIAGNOSIS — Z0289 Encounter for other administrative examinations: Secondary | ICD-10-CM

## 2021-11-24 DIAGNOSIS — M545 Low back pain, unspecified: Secondary | ICD-10-CM

## 2021-11-24 NOTE — Discharge Instructions (Addendum)
I recommend following up with the orthopedic specialists regarding your persistent back pain. They will be able to provide further imaging and treatment options.   Please call them to make an appointment.
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
## 2021-11-26 ENCOUNTER — Ambulatory Visit (HOSPITAL_COMMUNITY)
Admission: EM | Admit: 2021-11-26 | Discharge: 2021-11-26 | Disposition: A | Discharge status: Home / Self Care | Payer: Self-pay

## 2021-11-26 DIAGNOSIS — M545 Low back pain, unspecified: Secondary | ICD-10-CM
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
## 2021-11-26 NOTE — Discharge Instructions (Signed)
Keep follow up with PCP Recommend follow up with Orthopedics

## 2021-12-10 NOTE — ED Provider Notes (Signed)
[facility]    CSN: [id number] Arrival date & time: 11/26/21  1724      History   Chief Complaint Chief Complaint  Patient presents with   Back Pain   Tailbone Pain    HPI Manuel Silva is a 36 y.o. male.   Pt presents for work note due to continued tail bone back and lower back pain.  He reports he has an upcoming MRI, unsure where or what time.  He has not seen orthopedics for this problems.  He denies new sx.  Denies radiation of pain, saddle anesthesia.   Has been seen several times for same problem.  He has been using tylenol/ibuprofen, and was recently prescribed steroid course.        Past Medical History:  Diagnosis Date   Back pain    Gastritis     There are no problems to display for this patient.   No past surgical history on file.     Home Medications    Prior to Admission medications   Medication Sig Start Date End Date Taking? Authorizing Provider  erythromycin ophthalmic ointment SMARTSIG:1 Application In Eye(s) 4 Times Daily 09/21/20   [provider]  ibuprofen (ADVIL) 800 MG tablet Take 1 tablet (800 mg total) by mouth every 8 (eight) hours as needed (pain). 10/31/21   Zenia Resides, MD  loperamide (IMODIUM) 2 MG capsule Take 1 capsule (2 mg total) by mouth 2 (two) times daily as needed for diarrhea or loose stools. 02/08/21   Particia Nearing, PA-C  predniSONE (DELTASONE) 20 MG tablet Take 2 tablets (40 mg total) by mouth daily. 11/19/21   White, Elita Boone, NP  tiZANidine (ZANAFLEX) 4 MG tablet Take 1 tablet (4 mg total) by mouth every 8 (eight) hours as needed for muscle spasms. 11/19/21   Valinda Hoar, NP    Family History Family History  Problem Relation Age of Onset   Healthy Mother    Healthy Father     Social History Social History   Tobacco Use   Smoking status: Some Days    Types: Cigarettes   Smokeless tobacco: Never  Vaping Use   Vaping Use: Never used  Substance Use Topics    Alcohol use: Not Currently    Comment: occasion   Drug use: No     Allergies   Penicillins   Review of Systems Review of Systems  Constitutional:  Negative for chills and fever.  HENT:  Negative for ear pain and sore throat.   Eyes:  Negative for pain and visual disturbance.  Respiratory:  Negative for cough and shortness of breath.   Cardiovascular:  Negative for chest pain and palpitations.  Gastrointestinal:  Negative for abdominal pain and vomiting.  Genitourinary:  Negative for dysuria and hematuria.  Musculoskeletal:  Positive for back pain. Negative for arthralgias.  Skin:  Negative for color change and rash.  Neurological:  Negative for seizures and syncope.  All other systems reviewed and are negative.    Physical Exam Triage Vital Signs ED Triage Vitals  Enc Vitals Group     BP 11/26/21 1755 (!) 141/82     Pulse Rate 11/26/21 1755 85     Resp 11/26/21 1755 20     Temp 11/26/21 1755 98.7 F (37.1 C)     Temp Source 11/26/21 1755 Oral     SpO2 11/26/21 1755 98 %     Weight --      Height --  Head Circumference --      Peak Flow --      Pain Score 11/26/21 1815 8     Pain Loc --      Pain Edu? --      Excl. in [place]? --    No data found.  Updated Vital Signs BP (!) 141/82 (BP Location: Left Arm)   Pulse 85   Temp 98.7 F (37.1 C) (Oral)   Resp 20   SpO2 98%   Visual Acuity Right Eye Distance:   Left Eye Distance:   Bilateral Distance:    Right Eye Near:   Left Eye Near:    Bilateral Near:     Physical Exam Vitals and nursing note reviewed.  Constitutional:      General: He is not in acute distress.    Appearance: He is well-developed.  HENT:     Head: Normocephalic and atraumatic.  Eyes:     Conjunctiva/sclera: Conjunctivae normal.  Cardiovascular:     Rate and Rhythm: Normal rate and regular rhythm.     Heart sounds: No murmur heard. Pulmonary:     Effort: Pulmonary effort is normal. No respiratory distress.     Breath sounds:  Normal breath sounds.  Abdominal:     Palpations: Abdomen is soft.     Tenderness: There is no abdominal tenderness.  Musculoskeletal:        General: No swelling.     Cervical back: Neck supple.  Skin:    General: Skin is warm and dry.     Capillary Refill: Capillary refill takes less than 2 seconds.  Neurological:     Mental Status: He is alert.  Psychiatric:        Mood and Affect: Mood normal.      UC Treatments / Results  Labs (all labs ordered are listed, but only abnormal results are displayed) Labs Reviewed - No data to display  EKG   Radiology No results found.  Procedures Procedures (including critical care time)  Medications Ordered in UC Medications - No data to display  Initial Impression / Assessment and Plan / UC Course  I have reviewed the triage vital signs and the nursing notes.  Pertinent labs & imaging results that were available during my care of the patient were reviewed by me and considered in my medical decision making (see chart for details).     Pain is unchanged.  Advised follow up with ortho.  Work note given.  Final Clinical Impressions(s) / UC Diagnoses   Final diagnoses:  Acute low back pain without sciatica, unspecified back pain laterality     Discharge Instructions      Keep follow up with PCP Recommend follow up with Orthopedics   ED Prescriptions   None    PDMP not reviewed this encounter.   Ward, Lenise Arena, PA-C 12/10/21 1257

## 2021-12-23 ENCOUNTER — Encounter (HOSPITAL_COMMUNITY): Payer: Self-pay

## 2021-12-23 ENCOUNTER — Ambulatory Visit (HOSPITAL_COMMUNITY)
Admission: EM | Admit: 2021-12-23 | Discharge: 2021-12-23 | Disposition: A | Discharge status: Home / Self Care | Payer: Self-pay | Attending: Physician Assistant | Admitting: Physician Assistant

## 2021-12-23 DIAGNOSIS — M545 Low back pain, unspecified: Secondary | ICD-10-CM

## 2021-12-23 MED ORDER — METHOCARBAMOL 500 MG PO TABS: 500.0000 mg | ORAL_TABLET | Freq: Two times a day (BID) | ORAL | 0 refills | Status: DC

## 2021-12-23 MED ORDER — IBUPROFEN 800 MG PO TABS: 800.0000 mg | ORAL_TABLET | Freq: Three times a day (TID) | ORAL | 0 refills | Status: DC | PRN
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
## 2021-12-23 NOTE — Discharge Instructions (Addendum)
Take ibuprofen for pain.  Do not take NSAIDs with this medication due to risk of GI bleeding.  Make sure to take it with food to prevent irritation of your stomach lining.  Use Robaxin up to twice a day.  This make you sleepy do not drive or drink alcohol with taking it.  Use heat, rest, stretch for additional symptom relief.  Follow-up with sports medicine.  Call them to schedule an appointment.  If you have any worsening symptoms including bowel/bladder incontinence, lower extremity weakness, numbness or tingling of your legs, increased pain you need to go to the emergency room.  I did provide you a work excuse note that requested light duty for the next week.  Unfortunately, we are unable to complete any paperwork if this is required by your employer as we discussed.

## 2021-12-23 NOTE — ED Triage Notes (Signed)
Pt reports lower back that started yesterday. He reports doing heavy lifting at work.  Pt would like a doctor's note.

## 2022-01-17 ENCOUNTER — Ambulatory Visit (HOSPITAL_COMMUNITY)
Admission: EM | Admit: 2022-01-17 | Discharge: 2022-01-17 | Disposition: A | Discharge status: Home / Self Care | Payer: Self-pay | Attending: Family Medicine | Admitting: Family Medicine

## 2022-01-17 DIAGNOSIS — M545 Low back pain, unspecified: Secondary | ICD-10-CM

## 2022-01-17 DIAGNOSIS — G8929 Other chronic pain: Secondary | ICD-10-CM

## 2022-01-17 MED ORDER — DEXAMETHASONE SODIUM PHOSPHATE 10 MG/ML IJ SOLN
INTRAMUSCULAR | Status: AC
  Filled 2022-01-17: qty 1

## 2022-01-17 MED ORDER — METHOCARBAMOL 500 MG PO TABS: 500.0000 mg | ORAL_TABLET | Freq: Two times a day (BID) | ORAL | 0 refills | Status: DC

## 2022-01-17 MED ORDER — DEXAMETHASONE SODIUM PHOSPHATE 10 MG/ML IJ SOLN
10.0000 mg | Freq: Once | INTRAMUSCULAR | Status: AC
  Administered 2022-01-17: 10 mg via INTRAMUSCULAR

## 2022-01-17 MED ORDER — DICLOFENAC SODIUM 75 MG PO TBEC: 75.0000 mg | DELAYED_RELEASE_TABLET | Freq: Two times a day (BID) | ORAL | 0 refills | Status: DC

## 2022-01-17 NOTE — ED Triage Notes (Signed)
Pt present with lower back pain. Pt reports this has been ongoing pain; Pt works doing heavy lifting.

## 2022-01-17 NOTE — Discharge Instructions (Addendum)

## 2022-01-20 NOTE — ED Provider Notes (Signed)
[facility]   [id number] 01/17/22 Arrival Time: 1836  ASSESSMENT & PLAN:  1. Chronic bilateral low back pain without sciatica   Acute on chronic.  Able to ambulate here and hemodynamically stable. No indication for imaging of back at this time given no trauma and normal neurological exam. Discussed.  Meds ordered this encounter  Medications   methocarbamol (ROBAXIN) 500 MG tablet    Sig: Take 1 tablet (500 mg total) by mouth 2 (two) times daily.    Dispense:  20 tablet    Refill:  0   diclofenac (VOLTAREN) 75 MG EC tablet    Sig: Take 1 tablet (75 mg total) by mouth 2 (two) times daily.    Dispense:  14 tablet    Refill:  0   dexamethasone (DECADRON) injection 10 mg   Work/school excuse note: provided. Medication sedation precautions given. Encourage ROM/movement as tolerated.  Recommend:  Follow-up Information     Schedule an appointment as soon as possible for a visit  with [facility].   Contact information: [address] [place] [address] [phone]                Reviewed expectations re: course of current medical issues. Questions answered. Outlined signs and symptoms indicating need for more acute intervention. Patient verbalized understanding. After Visit Summary given.   SUBJECTIVE: History from: patient.  Manuel Silva is a 36 y.o. male who presents with complaint of acute on chronic non-radiating low back pain exacerbation. Works at [employer]; heavy lifting; feels this is causing exacerbations. No back trauma. No extremity sensation changes or weakness. Ambulatory without difficulty. Normal bowel/bladder habits: yes; without urinary retention. Normal PO intake without n/v. No associated abdominal pain/n/v. Self treatment: has  OTC analgesics without much help . Reports no chronic steroid use, fevers, IV drug use, or recent back surgeries or  procedures.   OBJECTIVE:  Vitals:   01/17/22 1907  BP: (!) 140/81  Pulse: 92  Resp: 18  Temp: 98.1 F (36.7 C)  TempSrc: Oral  SpO2: 98%    General appearance: alert; no distress HEENT: [place]; AT Neck: supple with FROM; without midline tenderness CV: regular Lungs: unlabored respirations; speaks full sentences without difficulty Abdomen: soft, non-tender; non-distended Back: moderate and poorly localized tenderness to palpation over bilateral lumbar paraspinal musculature ; FROM at waist; bruising: none; without midline tenderness Extremities: without edema; symmetrical without gross deformities; normal ROM of bilateral LE Skin: warm and dry Neurologic: normal gait; normal sensation and strength of bilateral LE Psychological: alert and cooperative; normal mood and affect  Allergies  Allergen Reactions   Penicillins Other (See Comments)    Unknown- was told he was allergic to PCN Has patient had a PCN reaction causing immediate rash, facial/tongue/throat swelling, SOB or lightheadedness with hypotension: unk Has patient had a PCN reaction causing severe rash involving mucus membranes or skin necrosis: unk Has patient had a PCN reaction that required hospitalization: unk Has patient had a PCN reaction occurring within the last 10 years: unk If all of the above answers are "NO", then may proceed with Cephalosporin use.      Past Medical History:  Diagnosis Date   Back pain    Gastritis    Social History   Socioeconomic History   Marital status: Single    Spouse name: Not on file   Number of children: Not on file   Years of education: Not on file  Highest education level: Not on file  Occupational History   Not on file  Tobacco Use   Smoking status: Some Days    Types: Cigarettes   Smokeless tobacco: Never  Vaping Use   Vaping Use: Never used  Substance and Sexual Activity   Alcohol use: Not Currently    Comment: occasion   Drug use: No   Sexual activity:  Yes  Other Topics Concern   Not on file  Social History Narrative   Not on file   Social Determinants of Health   Financial Resource Strain: Not on file  Food Insecurity: Not on file  Transportation Needs: Not on file  Physical Activity: Not on file  Stress: Not on file  Social Connections: Not on file  Intimate Partner Violence: Not on file   Family History  Problem Relation Age of Onset   Healthy Mother    Healthy Father    No past surgical history on file.    Vanessa Kick, MD 01/20/22 1213

## 2022-01-30 ENCOUNTER — Encounter (HOSPITAL_COMMUNITY): Payer: Self-pay

## 2022-01-30 ENCOUNTER — Ambulatory Visit (HOSPITAL_COMMUNITY)
Admission: EM | Admit: 2022-01-30 | Discharge: 2022-01-30 | Disposition: A | Discharge status: Home / Self Care | Payer: Self-pay

## 2022-01-30 DIAGNOSIS — G8929 Other chronic pain: Secondary | ICD-10-CM

## 2022-01-30 DIAGNOSIS — M5441 Lumbago with sciatica, right side: Secondary | ICD-10-CM
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
## 2022-01-30 NOTE — ED Triage Notes (Signed)
Pt is here for extension for light duty  for work until pt see specialist .

## 2022-01-30 NOTE — Discharge Instructions (Addendum)
Please follow-up with your PCP at long-term restrictions for your job.

## 2022-03-02 ENCOUNTER — Inpatient Hospital Stay (HOSPITAL_COMMUNITY)
Admission: RE | Admit: 2022-03-02 | Discharge: 2022-03-02 | Disposition: A | Discharge status: Home / Self Care | Payer: Self-pay | Source: Ambulatory Visit | Attending: Specialist | Admitting: Specialist

## 2022-03-03 ENCOUNTER — Ambulatory Visit (HOSPITAL_COMMUNITY)
Admission: RE | Admit: 2022-03-03 | Discharge: 2022-03-03 | Disposition: A | Discharge status: Home / Self Care | Payer: Self-pay | Source: Ambulatory Visit | Attending: Physician Assistant | Admitting: Physician Assistant

## 2022-03-03 ENCOUNTER — Encounter (HOSPITAL_COMMUNITY): Payer: Self-pay

## 2022-03-03 VITALS — BP 130/82 | HR 66 | Temp 97.9°F | Resp 16

## 2022-03-03 DIAGNOSIS — J069 Acute upper respiratory infection, unspecified: Secondary | ICD-10-CM | POA: Insufficient documentation

## 2022-03-03 DIAGNOSIS — Z1152 Encounter for screening for COVID-19: Secondary | ICD-10-CM | POA: Insufficient documentation

## 2022-03-03 LAB — RESP PANEL BY RT-PCR (FLU A&B, COVID) ARPGX2
Influenza A by PCR: NEGATIVE
Influenza B by PCR: NEGATIVE
SARS Coronavirus 2 by RT PCR: NEGATIVE

## 2022-03-03 MED ORDER — PROMETHAZINE-DM 6.25-15 MG/5ML PO SYRP: 5.0000 mL | ORAL_SOLUTION | Freq: Four times a day (QID) | ORAL | 0 refills | Status: DC | PRN

## 2022-03-03 NOTE — Discharge Instructions (Signed)
Your COVID/Flu test are pending Recommend cough syrup as needed Recommend Mucinex and Flonase Drink plenty of fluids, rest Return if you develop new or worsening symptoms.

## 2022-03-03 NOTE — ED Triage Notes (Signed)
Pt states cough and congestion for the past 2 days. Has been taking Nyquil at home.

## 2022-03-03 NOTE — ED Provider Notes (Signed)
[facility]    CSN: [id number] Arrival date & time: 03/03/22  1347      History   Chief Complaint Chief Complaint  Patient presents with   Cough    HPI Manuel Silva is a 36 y.o. male.   Patient complains of cough and congestion that started 3 days ago.  Mother reports that his son has been sick with RSV and his mother has been sick with flu.  He has been taking NyQuil with some relief.  He denies fever, but does report chills.  Denies body aches, shortness of breath, wheezing.    Past Medical History:  Diagnosis Date   Back pain    Gastritis     There are no problems to display for this patient.   History reviewed. No pertinent surgical history.     Home Medications    Prior to Admission medications   Medication Sig Start Date End Date Taking? Authorizing Provider  promethazine-dextromethorphan (PROMETHAZINE-DM) 6.25-15 MG/5ML syrup Take 5 mLs by mouth 4 (four) times daily as needed for cough. 03/03/22  Yes Ward, Tylene Fantasia, PA-C  diclofenac (VOLTAREN) 75 MG EC tablet Take 1 tablet (75 mg total) by mouth 2 (two) times daily. 01/17/22   Mardella Layman, MD  erythromycin ophthalmic ointment SMARTSIG:1 Application In Eye(s) 4 Times Daily 09/21/20   [provider]  loperamide (IMODIUM) 2 MG capsule Take 1 capsule (2 mg total) by mouth 2 (two) times daily as needed for diarrhea or loose stools. 02/08/21   Particia Nearing, PA-C  methocarbamol (ROBAXIN) 500 MG tablet Take 1 tablet (500 mg total) by mouth 2 (two) times daily. 01/17/22   Mardella Layman, MD    Family History Family History  Problem Relation Age of Onset   Healthy Mother    Healthy Father     Social History Social History   Tobacco Use   Smoking status: Some Days    Types: Cigarettes   Smokeless tobacco: Never  Vaping Use   Vaping Use: Never used  Substance Use Topics   Alcohol use: Not Currently    Comment: occasion   Drug use: No     Allergies    Penicillins   Review of Systems Review of Systems  Constitutional:  Positive for chills. Negative for fever.  HENT:  Positive for congestion. Negative for ear pain and sore throat.   Eyes:  Negative for pain and visual disturbance.  Respiratory:  Positive for cough. Negative for shortness of breath.   Cardiovascular:  Negative for chest pain and palpitations.  Gastrointestinal:  Negative for abdominal pain and vomiting.  Genitourinary:  Negative for dysuria and hematuria.  Musculoskeletal:  Negative for arthralgias and back pain.  Skin:  Negative for color change and rash.  Neurological:  Negative for seizures and syncope.  All other systems reviewed and are negative.    Physical Exam Triage Vital Signs ED Triage Vitals  Enc Vitals Group     BP 03/03/22 1358 130/82     Pulse Rate 03/03/22 1358 66     Resp 03/03/22 1358 16     Temp 03/03/22 1358 97.9 F (36.6 C)     Temp src --      SpO2 03/03/22 1358 95 %     Weight --      Height --      Head Circumference --      Peak Flow --      Pain Score 03/03/22 1359 0  Pain Loc --      Pain Edu? --      Excl. in [place]? --    No data found.  Updated Vital Signs BP 130/82 (BP Location: Left Arm)   Pulse 66   Temp 97.9 F (36.6 C)   Resp 16   SpO2 95%   Visual Acuity Right Eye Distance:   Left Eye Distance:   Bilateral Distance:    Right Eye Near:   Left Eye Near:    Bilateral Near:     Physical Exam Vitals and nursing note reviewed.  Constitutional:      General: He is not in acute distress.    Appearance: He is well-developed.  HENT:     Head: Normocephalic and atraumatic.  Eyes:     Conjunctiva/sclera: Conjunctivae normal.  Cardiovascular:     Rate and Rhythm: Normal rate and regular rhythm.     Heart sounds: No murmur heard. Pulmonary:     Effort: Pulmonary effort is normal. No respiratory distress.     Breath sounds: Normal breath sounds.  Abdominal:     Palpations: Abdomen is soft.      Tenderness: There is no abdominal tenderness.  Musculoskeletal:        General: No swelling.     Cervical back: Neck supple.  Skin:    General: Skin is warm and dry.     Capillary Refill: Capillary refill takes less than 2 seconds.  Neurological:     Mental Status: He is alert.  Psychiatric:        Mood and Affect: Mood normal.      UC Treatments / Results  Labs (all labs ordered are listed, but only abnormal results are displayed) Labs Reviewed  RESP PANEL BY RT-PCR (FLU A&B, COVID) [id number]    EKG   Radiology No results found.  Procedures Procedures (including critical care time)  Medications Ordered in UC Medications - No data to display  Initial Impression / Assessment and Plan / UC Course  I have reviewed the triage vital signs and the nursing notes.  Pertinent labs & imaging results that were available during my care of the patient were reviewed by me and considered in my medical decision making (see chart for details).     URI COVID and flu test pending.  Patient overall stable, in no acute distress, vitals within normal limits, lungs clear.  Stable for discharge with supportive care.  Return precautions discussed.  Final Clinical Impressions(s) / UC Diagnoses   Final diagnoses:  Viral URI with cough     Discharge Instructions      Your COVID/Flu test are pending Recommend cough syrup as needed Recommend Mucinex and Flonase Drink plenty of fluids, rest Return if you develop new or worsening symptoms.     ED Prescriptions     Medication Sig Dispense Auth. Provider   promethazine-dextromethorphan (PROMETHAZINE-DM) 6.25-15 MG/5ML syrup Take 5 mLs by mouth 4 (four) times daily as needed for cough. 118 mL Ward, Lenise Arena, PA-C      PDMP not reviewed this encounter.   Ward, Lenise Arena, PA-C 03/03/22 1433

## 2022-03-07 ENCOUNTER — Encounter (HOSPITAL_COMMUNITY): Payer: Self-pay

## 2022-03-07 ENCOUNTER — Ambulatory Visit (HOSPITAL_COMMUNITY)
Admission: EM | Admit: 2022-03-07 | Discharge: 2022-03-07 | Disposition: A | Discharge status: Home / Self Care | Payer: Self-pay | Attending: Family Medicine | Admitting: Family Medicine

## 2022-03-07 DIAGNOSIS — R062 Wheezing: Secondary | ICD-10-CM

## 2022-03-07 DIAGNOSIS — J0100 Acute maxillary sinusitis, unspecified: Secondary | ICD-10-CM

## 2022-03-07 DIAGNOSIS — R051 Acute cough: Secondary | ICD-10-CM

## 2022-03-07 MED ORDER — DOXYCYCLINE HYCLATE 100 MG PO CAPS
100.0000 mg | ORAL_CAPSULE | Freq: Two times a day (BID) | ORAL | 0 refills | Status: DC
Start: 2022-03-07 — End: 2022-04-30

## 2022-03-07 MED ORDER — HYDROCODONE BIT-HOMATROP MBR 5-1.5 MG/5ML PO SOLN: 5.0000 mL | Freq: Four times a day (QID) | ORAL | 0 refills | Status: DC | PRN

## 2022-03-07 MED ORDER — PREDNISONE 20 MG PO TABS: 40.0000 mg | ORAL_TABLET | Freq: Every day | ORAL | 0 refills | Status: DC

## 2022-03-07 NOTE — Discharge Instructions (Signed)
Be aware, your cough medication may cause drowsiness. Please do not drive, operate heavy machinery or make important decisions while on this medication as it may cloud your judgement.  

## 2022-03-07 NOTE — ED Triage Notes (Signed)
Pt states cough and congestion and sinus pressure for the past week seen her for the same last week,told to come back if he is not feeling better.

## 2022-03-08 NOTE — ED Provider Notes (Signed)
[facility]   [id number] 03/07/22 Arrival Time: 1926  ASSESSMENT & PLAN:  1. Acute cough   2. Wheezing   3. Acute non-recurrent maxillary sinusitis    OTC symptom care as needed.  Discharge Medication List as of 03/07/2022  9:07 PM     START taking these medications   Details  doxycycline (VIBRAMYCIN) 100 MG capsule Take 1 capsule (100 mg total) by mouth 2 (two) times daily., Starting Wed 03/07/2022, Normal    HYDROcodone bit-homatropine (HYCODAN) 5-1.5 MG/5ML syrup Take 5 mLs by mouth every 6 (six) hours as needed for cough., Starting Wed 03/07/2022, Normal    predniSONE (DELTASONE) 20 MG tablet Take 2 tablets (40 mg total) by mouth daily., Starting Wed 03/07/2022, Normal       No resp distress.   Follow-up Information     Jearld Lesch, MD.   Specialty: Family Medicine Why: If worsening or failing to improve as anticipated. Contact information: [address] [place] [address] [phone]                 Reviewed expectations re: course of current medical issues. Questions answered. Outlined signs and symptoms indicating need for more acute intervention. Understanding verbalized. After Visit Summary given.   SUBJECTIVE: History from: Patient. Manuel Silva is a 36 y.o. male. Reports: Pt states cough and congestion and sinus pressure for the past week seen her for the same last week,told to come back if he is not feeling better. Denies: difficulty breathing. With sinus pressure. Denies fever. Normal PO intake without n/v/d.  OBJECTIVE:  Vitals:   03/07/22 2052  BP: 130/84  Pulse: 78  Resp: 16  Temp: 98.4 F (36.9 C)  TempSrc: Oral  SpO2: 98%    General appearance: alert; no distress Eyes: PERRLA; EOMI; conjunctiva normal HENT: [place]; AT; with nasal congestion; bilat maxillary sinus TTP Neck: supple  Lungs: speaks full sentences without difficulty; unlabored; active cough with coarse breath sounds; no  wheezing Extremities: no edema Skin: warm and dry Neurologic: normal gait Psychological: alert and cooperative; normal mood and affect  Labs: Results for orders placed or performed during the hospital encounter of 03/03/22  Resp Panel by RT-PCR (Flu A&B, Covid) Anterior Nasal Swab   Specimen: Anterior Nasal Swab  Result Value Ref Range   SARS Coronavirus 2 by RT PCR NEGATIVE NEGATIVE   Influenza A by PCR NEGATIVE NEGATIVE   Influenza B by PCR NEGATIVE NEGATIVE    Allergies  Allergen Reactions   Penicillins Other (See Comments)    Unknown- was told he was allergic to PCN Has patient had a PCN reaction causing immediate rash, facial/tongue/throat swelling, SOB or lightheadedness with hypotension: unk Has patient had a PCN reaction causing severe rash involving mucus membranes or skin necrosis: unk Has patient had a PCN reaction that required hospitalization: unk Has patient had a PCN reaction occurring within the last 10 years: unk If all of the above answers are "NO", then may proceed with Cephalosporin use.      Past Medical History:  Diagnosis Date   Back pain    Gastritis    Social History   Socioeconomic History   Marital status: Single    Spouse name: Not on file   Number of children: Not on file   Years of education: Not on file   Highest education level: Not on file  Occupational History   Not on file  Tobacco Use   Smoking status: Some Days    Types:  Cigarettes   Smokeless tobacco: Never  Vaping Use   Vaping Use: Never used  Substance and Sexual Activity   Alcohol use: Not Currently    Comment: occasion   Drug use: No   Sexual activity: Yes  Other Topics Concern   Not on file  Social History Narrative   Not on file   Social Determinants of Health   Financial Resource Strain: Not on file  Food Insecurity: Not on file  Transportation Needs: Not on file  Physical Activity: Not on file  Stress: Not on file  Social Connections: Not on file   Intimate Partner Violence: Not on file   Family History  Problem Relation Age of Onset   Healthy Mother    Healthy Father    History reviewed. No pertinent surgical history.   Vanessa Kick, MD 03/08/22 [phone]

## 2022-04-30 ENCOUNTER — Encounter (HOSPITAL_COMMUNITY): Payer: Self-pay

## 2022-04-30 ENCOUNTER — Ambulatory Visit (HOSPITAL_COMMUNITY): Payer: Self-pay

## 2022-04-30 ENCOUNTER — Ambulatory Visit (INDEPENDENT_AMBULATORY_CARE_PROVIDER_SITE_OTHER): Payer: Medicaid Other

## 2022-04-30 ENCOUNTER — Ambulatory Visit (HOSPITAL_COMMUNITY)
Admission: RE | Admit: 2022-04-30 | Discharge: 2022-04-30 | Disposition: A | Discharge status: Home / Self Care | Payer: Medicaid Other | Source: Ambulatory Visit | Attending: Internal Medicine | Admitting: Internal Medicine

## 2022-04-30 VITALS — BP 113/76 | HR 64 | Temp 98.0°F | Resp 12

## 2022-04-30 DIAGNOSIS — R059 Cough, unspecified: Secondary | ICD-10-CM

## 2022-04-30 DIAGNOSIS — R062 Wheezing: Secondary | ICD-10-CM | POA: Diagnosis not present

## 2022-04-30 DIAGNOSIS — J40 Bronchitis, not specified as acute or chronic: Secondary | ICD-10-CM | POA: Diagnosis not present

## 2022-04-30 MED ORDER — DOXYCYCLINE HYCLATE 100 MG PO CAPS: 100.0000 mg | ORAL_CAPSULE | Freq: Two times a day (BID) | ORAL | 0 refills | Status: DC

## 2022-04-30 MED ORDER — ALBUTEROL SULFATE (2.5 MG/3ML) 0.083% IN NEBU
INHALATION_SOLUTION | RESPIRATORY_TRACT | Status: AC
  Filled 2022-04-30: qty 3

## 2022-04-30 MED ORDER — ALBUTEROL SULFATE (2.5 MG/3ML) 0.083% IN NEBU
2.5000 mg | INHALATION_SOLUTION | Freq: Once | RESPIRATORY_TRACT | Status: AC
  Administered 2022-04-30: 2.5 mg via RESPIRATORY_TRACT

## 2022-04-30 MED ORDER — ALBUTEROL SULFATE HFA 108 (90 BASE) MCG/ACT IN AERS: 2.0000 | INHALATION_SPRAY | RESPIRATORY_TRACT | 0 refills | Status: DC | PRN

## 2022-04-30 NOTE — Discharge Instructions (Signed)
Try to completely quit vaping since you seem to get frequent lung problems per your medical records.

## 2022-04-30 NOTE — ED Provider Notes (Signed)
[place]    CSN: [id number] Arrival date & time: 04/30/22  1507      History   Chief Complaint Chief Complaint  Patient presents with   Cough    HPI Manuel Silva is a 37 y.o. male who presents with onset of rhinitis, stuffy nose, cough, chest congestion  and nausea x 1 week. Has been exposed to RSV through 2 of his kids. Pt vapes sometimes. His cough has been productive with yellow mucous. Had night sweats 2 nights ago. Has been wheezing at night time and was worse last night. Denies fever.     Past Medical History:  Diagnosis Date   Back pain    Gastritis     There are no problems to display for this patient.   History reviewed. No pertinent surgical history.     Home Medications    Prior to Admission medications   Medication Sig Start Date End Date Taking? Authorizing Provider  albuterol (VENTOLIN HFA) 108 (90 Base) MCG/ACT inhaler Inhale 2 puffs into the lungs every 4 (four) hours as needed for wheezing or shortness of breath. For 5 days, then prn wheezing 04/30/22  Yes Rodriguez-Southworth, Sunday Spillers, PA-C  doxycycline (VIBRAMYCIN) 100 MG capsule Take 1 capsule (100 mg total) by mouth 2 (two) times daily. 04/30/22  Yes Rodriguez-Southworth, Sunday Spillers, PA-C  Oxycodone HCl 10 MG TABS Take 10 mg by mouth every 8 (eight) hours as needed. 04/24/22  Yes [provider]  loperamide (IMODIUM) 2 MG capsule Take 1 capsule (2 mg total) by mouth 2 (two) times daily as needed for diarrhea or loose stools. 02/08/21   Volney American, PA-C  methocarbamol (ROBAXIN) 500 MG tablet Take 1 tablet (500 mg total) by mouth 2 (two) times daily. 01/17/22   Vanessa Kick, MD    Family History Family History  Problem Relation Age of Onset   Healthy Mother    Healthy Father     Social History Social History   Tobacco Use   Smoking status: Some Days    Types: Cigarettes   Smokeless tobacco: Never  Vaping Use   Vaping Use: Never used  Substance Use  Topics   Alcohol use: Not Currently    Comment: occasion   Drug use: No     Allergies   Penicillins   Review of Systems Review of Systems  Constitutional:  Positive for diaphoresis and fatigue. Negative for activity change, appetite change and fever.  HENT:  Positive for congestion, postnasal drip and rhinorrhea. Negative for ear discharge, ear pain, hearing loss and sore throat.   Eyes:  Negative for discharge.  Respiratory:  Positive for cough, chest tightness and wheezing.   Cardiovascular:  Negative for chest pain.  Musculoskeletal:  Negative for myalgias.  Neurological:  Negative for headaches.  Hematological:  Negative for adenopathy.     Physical Exam Triage Vital Signs ED Triage Vitals  Enc Vitals Group     BP 04/30/22 1526 113/76     Pulse Rate 04/30/22 1526 64     Resp 04/30/22 1526 12     Temp 04/30/22 1526 98 F (36.7 C)     Temp Source 04/30/22 1526 Oral     SpO2 04/30/22 1526 97 %     Weight --      Height --      Head Circumference --      Peak Flow --      Pain Score 04/30/22 1523 5     Pain Loc --  Pain Edu? --      Excl. in [place]? --    No data found.  Updated Vital Signs BP 113/76 (BP Location: Right Arm)   Pulse 64   Temp 98 F (36.7 C) (Oral)   Resp 12   SpO2 97%   Visual Acuity Right Eye Distance:   Left Eye Distance:   Bilateral Distance:    Right Eye Near:   Left Eye Near:    Bilateral Near:      Physical Exam Constitutional:      General: He is not in acute distress.    Appearance: He is not toxic-appearing.  HENT:     Head: Normocephalic.     Right Ear: TM not visible due to wax, external ear normal.     Left Ear: TM not visible due to wax, external ear normal.     Nose: Nose normal.     Mouth/Throat: clear    Mouth: Mucous membranes are moist.     Pharynx: Oropharynx is clear.  Eyes:     General: No scleral icterus.    Conjunctiva/sclera: Conjunctivae normal.  Cardiovascular:     Rate and Rhythm: Normal rate  and regular rhythm.     Heart sounds: No murmur heard.   Pulmonary:     Effort: Pulmonary effort is normal. No respiratory distress.     Breath sounds: Wheezing present.     Comments: Has auditory wheezing Musculoskeletal:        General: Normal range of motion.     Cervical back: Neck supple.  Lymphadenopathy:     Cervical: No cervical adenopathy.  Skin:    General: Skin is warm and dry.     Findings: No rash.  Neurological:     Mental Status: He is alert and oriented to person, place, and time.     Gait: Gait normal.  Psychiatric:        Mood and Affect: Mood normal.        Behavior: Behavior normal.        Thought Content: Thought content normal.        Judgment: Judgment normal.    UC Treatments / Results  Labs (all labs ordered are listed, but only abnormal results are displayed) Labs Reviewed - No data to display  EKG   Radiology DG Chest 2 View  Result Date: 04/30/2022 CLINICAL DATA:  Wheezing, night sweats and cough. EXAM: CHEST - 2 VIEW COMPARISON:  03/01/2021. FINDINGS: Trachea is midline. Heart size normal. Lungs are clear. No pleural fluid. IMPRESSION: Negative. Electronically Signed   By: Lorin Picket M.D.   On: 04/30/2022 16:14    Procedures Procedures (including critical care time)  Medications Ordered in UC Medications  albuterol (PROVENTIL) (2.5 MG/3ML) 0.083% nebulizer solution 2.5 mg (2.5 mg Nebulization Given 04/30/22 1620)    Initial Impression / Assessment and Plan / UC Course  I have reviewed the triage vital signs and the nursing notes.  Pertinent  imaging results that were available during my care of the patient were reviewed by me and considered in my medical decision making (see chart for details).  He was given albuterol neb and wheezing resolved  I placed him on Albuterol inhaler and Doxy  as noted to cover secondary infection since he vapes and has had night sweats 2 nights ago.    Final Clinical Impressions(s) / UC Diagnoses    Final diagnoses:  Bronchitis     Discharge Instructions      Try to  completely quit vaping since you seem to get frequent lung problems per your medical records.      ED Prescriptions     Medication Sig Dispense Auth. Provider   doxycycline (VIBRAMYCIN) 100 MG capsule Take 1 capsule (100 mg total) by mouth 2 (two) times daily. 14 capsule Rodriguez-Southworth, Sunday Spillers, PA-C   albuterol (VENTOLIN HFA) 108 (90 Base) MCG/ACT inhaler Inhale 2 puffs into the lungs every 4 (four) hours as needed for wheezing or shortness of breath. For 5 days, then prn wheezing 18 g Rodriguez-Southworth, Sunday Spillers, PA-C      PDMP not reviewed this encounter.   Shelby Mattocks, [place] 04/30/22 1634

## 2022-04-30 NOTE — ED Triage Notes (Signed)
Pt has been exposed to RSV, pt complains of chest congestion, nasal congestion, , runny nose , back pain, cough, nausea x1wk

## 2022-05-05 ENCOUNTER — Ambulatory Visit (HOSPITAL_COMMUNITY)
Admission: EM | Admit: 2022-05-05 | Discharge: 2022-05-05 | Disposition: A | Discharge status: Home / Self Care | Payer: Medicaid Other | Attending: Physician Assistant | Admitting: Physician Assistant

## 2022-05-05 ENCOUNTER — Other Ambulatory Visit: Payer: Self-pay

## 2022-05-05 ENCOUNTER — Encounter (HOSPITAL_COMMUNITY): Payer: Self-pay | Admitting: *Deleted

## 2022-05-05 DIAGNOSIS — Z1152 Encounter for screening for COVID-19: Secondary | ICD-10-CM | POA: Diagnosis not present

## 2022-05-05 DIAGNOSIS — J069 Acute upper respiratory infection, unspecified: Secondary | ICD-10-CM

## 2022-05-05 DIAGNOSIS — J40 Bronchitis, not specified as acute or chronic: Secondary | ICD-10-CM | POA: Diagnosis not present

## 2022-05-05 DIAGNOSIS — R062 Wheezing: Secondary | ICD-10-CM

## 2022-05-05 MED ORDER — IPRATROPIUM-ALBUTEROL 0.5-2.5 (3) MG/3ML IN SOLN
RESPIRATORY_TRACT | Status: AC
  Filled 2022-05-05: qty 3

## 2022-05-05 MED ORDER — DM-GUAIFENESIN ER 30-600 MG PO TB12: 1.0000 | ORAL_TABLET | Freq: Two times a day (BID) | ORAL | 0 refills | Status: DC

## 2022-05-05 MED ORDER — IPRATROPIUM-ALBUTEROL 0.5-2.5 (3) MG/3ML IN SOLN
3.0000 mL | Freq: Once | RESPIRATORY_TRACT | Status: AC
  Administered 2022-05-05: 3 mL via RESPIRATORY_TRACT

## 2022-05-05 NOTE — ED Triage Notes (Signed)
Pt c/o cough and nasal congestion x approx 1 wk without fevers. States presents to day bc employer wishes to have a Covid test and return to work excuse.

## 2022-05-05 NOTE — Discharge Instructions (Addendum)
Advised take Mucinex DM every 12 hours to help control cough and congestion. Advised to use the albuterol inhaler, 2 puffs every 6 hours on a regular basis to help decrease cough, congestion and wheezing.  COVID test will be completed in 48 hours.  Log onto MyChart to view the test results when they post in 48 hours.  If you do not get a call from this office in 48 hours that indicates the test results are negative.  Advised follow-up PCP or return to urgent care as needed.

## 2022-05-05 NOTE — ED Provider Notes (Signed)
[facility]    CSN: [id number] Arrival date & time: 05/05/22  1239      History   Chief Complaint Chief Complaint  Patient presents with   Cough   Nasal Congestion    HPI Manuel Silva is a 37 y.o. male.   37 year old male presents with cough, congestion and wheezing.  Patient indicates that for the past week he has been having upper respiratory congestion with rhinitis and postnasal drip which is mainly been clear he also indicates having chest congestion with intermittent cough, wheezing, and shortness of breath.  He indicates that the cough is worse at night and keeps him up when he is having coughing spasms.  He indicates the production is usually clear but thick.  He has not have any fever or chills.  He was seen recently and given a prescription for albuterol inhaler which she has not picked up yet and also prescription for doxycycline 100 mg every 12 hours.  He indicates that his work is requiring him to have a COVID test to make sure that he does not have [place] and a note to allow him to return to work.  He is without fever, no nausea or vomiting.  He relates he does smoke on a regular basis.   Cough Associated symptoms: shortness of breath and wheezing     Past Medical History:  Diagnosis Date   Back pain    Gastritis     There are no problems to display for this patient.   History reviewed. No pertinent surgical history.     Home Medications    Prior to Admission medications   Medication Sig Start Date End Date Taking? Authorizing Provider  dextromethorphan-guaiFENesin (MUCINEX DM) 30-600 MG 12hr tablet Take 1 tablet by mouth 2 (two) times daily. 05/05/22  Yes Nyoka Lint, PA-C  albuterol (VENTOLIN HFA) 108 (90 Base) MCG/ACT inhaler Inhale 2 puffs into the lungs every 4 (four) hours as needed for wheezing or shortness of breath. For 5 days, then prn wheezing 04/30/22   Rodriguez-Southworth, Sunday Spillers, PA-C  doxycycline (VIBRAMYCIN) 100 MG  capsule Take 1 capsule (100 mg total) by mouth 2 (two) times daily. 04/30/22   Rodriguez-Southworth, Sunday Spillers, PA-C  loperamide (IMODIUM) 2 MG capsule Take 1 capsule (2 mg total) by mouth 2 (two) times daily as needed for diarrhea or loose stools. 02/08/21   Volney American, PA-C  methocarbamol (ROBAXIN) 500 MG tablet Take 1 tablet (500 mg total) by mouth 2 (two) times daily. 01/17/22   Vanessa Kick, MD  Oxycodone HCl 10 MG TABS Take 10 mg by mouth every 8 (eight) hours as needed. 04/24/22   [provider]    Family History Family History  Problem Relation Age of Onset   Healthy Mother    Healthy Father     Social History Social History   Tobacco Use   Smoking status: Some Days    Types: Cigarettes   Smokeless tobacco: Never  Vaping Use   Vaping Use: Some days  Substance Use Topics   Alcohol use: Not Currently    Comment: occasionally   Drug use: No     Allergies   Penicillins   Review of Systems Review of Systems  Respiratory:  Positive for cough, shortness of breath and wheezing.      Physical Exam Triage Vital Signs ED Triage Vitals  Enc Vitals Group     BP 05/05/22 1413 120/77     Pulse Rate 05/05/22 1413 (!) 54  Resp 05/05/22 1413 16     Temp 05/05/22 1413 98.1 F (36.7 C)     Temp Source 05/05/22 1413 Oral     SpO2 05/05/22 1413 96 %     Weight --      Height --      Head Circumference --      Peak Flow --      Pain Score 05/05/22 1414 0     Pain Loc --      Pain Edu? --      Excl. in [place]? --    No data found.  Updated Vital Signs BP 120/77   Pulse (!) 54   Temp 98.1 F (36.7 C) (Oral)   Resp 16   SpO2 96%   Visual Acuity Right Eye Distance:   Left Eye Distance:   Bilateral Distance:    Right Eye Near:   Left Eye Near:    Bilateral Near:     Physical Exam Constitutional:      Appearance: Normal appearance.  HENT:     Right Ear: Tympanic membrane and ear canal normal.     Left Ear: Tympanic membrane and ear canal  normal.     Mouth/Throat:     Mouth: Mucous membranes are moist.     Pharynx: Oropharynx is clear.  Pulmonary:     Effort: Pulmonary effort is normal.     Breath sounds: Normal air entry. Examination of the right-lower field reveals wheezing. Examination of the left-lower field reveals wheezing. Wheezing (mild with expiration) present. No rhonchi or rales.  Lymphadenopathy:     Cervical: No cervical adenopathy.  Neurological:     Mental Status: He is alert.      UC Treatments / Results  Labs (all labs ordered are listed, but only abnormal results are displayed) Labs Reviewed  SARS CORONAVIRUS 2 (TAT 6-24 HRS)    EKG   Radiology No results found.  Procedures Procedures (including critical care time)  Medications Ordered in UC Medications  ipratropium-albuterol (DUONEB) 0.5-2.5 (3) MG/3ML nebulizer solution 3 mL (3 mLs Nebulization Given 05/05/22 1519)    Initial Impression / Assessment and Plan / UC Course  I have reviewed the triage vital signs and the nursing notes.  Pertinent labs & imaging results that were available during my care of the patient were reviewed by me and considered in my medical decision making (see chart for details).    Plan: The diagnosis will be treated with the following: 1.  Upper respiratory tract infection: A.  Mucinex DM every 12 hours to control cough and congestion. 2.  Bronchitis: A.  Albuterol inhaler, 2 puffs every 6 hours on a regular basis to control cough, wheezing and congestion. 3.  Wheezing: A.  DuoNeb nebulizer treatment given in the office today. B.  Advised to continue to use albuterol inhaler, 2 puffs every 6 hours on a regular basis. 4.  Screening for COVID-19: A.  Treatment may be modified depending on the results of the COVID test. 5.  Advised follow-up PCP or return to urgent care as needed.  Final Clinical Impressions(s) / UC Diagnoses   Final diagnoses:  Viral upper respiratory tract infection  Bronchitis   Wheezing  Encounter for screening for COVID-19     Discharge Instructions      Advised take Mucinex DM every 12 hours to help control cough and congestion. Advised to use the albuterol inhaler, 2 puffs every 6 hours on a regular basis to help decrease cough, congestion and  wheezing.  COVID test will be completed in 48 hours.  Log onto MyChart to view the test results when they post in 48 hours.  If you do not get a call from this office in 48 hours that indicates the test results are negative.  Advised follow-up PCP or return to urgent care as needed.    ED Prescriptions     Medication Sig Dispense Auth. Provider   dextromethorphan-guaiFENesin (MUCINEX DM) 30-600 MG 12hr tablet Take 1 tablet by mouth 2 (two) times daily. 20 tablet Nyoka Lint, PA-C      PDMP not reviewed this encounter.   Nyoka Lint, PA-C 05/05/22 1529

## 2022-05-06 LAB — SARS CORONAVIRUS 2 (TAT 6-24 HRS): SARS Coronavirus 2: NEGATIVE

## 2022-05-17 ENCOUNTER — Ambulatory Visit (HOSPITAL_COMMUNITY)
Admission: EM | Admit: 2022-05-17 | Discharge: 2022-05-17 | Disposition: A | Discharge status: Home / Self Care | Payer: Medicaid Other | Attending: Physician Assistant | Admitting: Physician Assistant

## 2022-05-17 ENCOUNTER — Other Ambulatory Visit: Payer: Self-pay

## 2022-05-17 ENCOUNTER — Encounter (HOSPITAL_COMMUNITY): Payer: Self-pay | Admitting: Emergency Medicine

## 2022-05-17 DIAGNOSIS — Z20822 Contact with and (suspected) exposure to covid-19: Secondary | ICD-10-CM | POA: Insufficient documentation

## 2022-05-17 NOTE — ED Triage Notes (Signed)
No symptoms.  Wants a covid test Has not done a home covid test.  Lives with family members that are covid positive.

## 2022-05-17 NOTE — ED Provider Notes (Signed)
Manuel Silva    CSN: [id number] Arrival date & time: 05/17/22  1916      History   Chief Complaint Chief Complaint  Patient presents with   wants covid test    HPI Manuel Silva is a 37 y.o. male.   Patient presents today send COVID-19 testing.  Reports that his 46-year-old son has been very sick and was diagnosed with a virus that he believes was COVID.  He was told that he should be tested as well.  He is not currently experiencing any symptoms and denies chest pain, shortness of breath, cough, nausea/vomiting, body aches, fever.  He has not take any over-the-counter medication.  He has had COVID several years ago.  He has not had COVID vaccinations.  He does vape but denies history of asthma, allergies, COPD.  Denies any recent antibiotics or steroids.  Reports he is feeling well but does need testing.    Past Medical History:  Diagnosis Date   Back pain    Gastritis     There are no problems to display for this patient.   History reviewed. No pertinent surgical history.     Home Medications    Prior to Admission medications   Medication Sig Start Date End Date Taking? Authorizing Provider  albuterol (VENTOLIN HFA) 108 (90 Base) MCG/ACT inhaler Inhale 2 puffs into the lungs every 4 (four) hours as needed for wheezing or shortness of breath. For 5 days, then prn wheezing 04/30/22   Rodriguez-Southworth, Sunday Spillers, PA-C  dextromethorphan-guaiFENesin [facility] DM) 30-600 MG 12hr tablet Take 1 tablet by mouth 2 (two) times daily. Patient not taking: Reported on 05/17/2022 05/05/22   Nyoka Lint, PA-C  doxycycline (VIBRAMYCIN) 100 MG capsule Take 1 capsule (100 mg total) by mouth 2 (two) times daily. Patient not taking: Reported on 05/17/2022 04/30/22   Rodriguez-Southworth, Sunday Spillers, PA-C  loperamide (IMODIUM) 2 MG capsule Take 1 capsule (2 mg total) by mouth 2 (two) times daily as needed for diarrhea or loose stools. 02/08/21   Volney American, PA-C   methocarbamol (ROBAXIN) 500 MG tablet Take 1 tablet (500 mg total) by mouth 2 (two) times daily. Patient not taking: Reported on 05/17/2022 01/17/22   Vanessa Kick, MD  Oxycodone HCl 10 MG TABS Take 10 mg by mouth every 8 (eight) hours as needed. 04/24/22   [provider]    Family History Family History  Problem Relation Age of Onset   Healthy Mother    Healthy Father     Social History Social History   Tobacco Use   Smoking status: Some Days    Types: Cigarettes   Smokeless tobacco: Never  Vaping Use   Vaping Use: Some days  Substance Use Topics   Alcohol use: Not Currently    Comment: occasionally   Drug use: No     Allergies   Penicillins   Review of Systems Review of Systems  Constitutional:  Negative for activity change, appetite change, fatigue and fever.  Respiratory:  Negative for cough and shortness of breath.   Cardiovascular:  Negative for chest pain.  Gastrointestinal:  Negative for abdominal pain, diarrhea, nausea and vomiting.  Neurological:  Negative for dizziness, light-headedness and headaches.     Physical Exam Triage Vital Signs ED Triage Vitals  Enc Vitals Group     BP 05/17/22 1959 133/76     Pulse Rate 05/17/22 1959 61     Resp 05/17/22 1959 20     Temp 05/17/22 1959 98.1 F (  36.7 C)     Temp Source 05/17/22 1959 Oral     SpO2 05/17/22 1959 96 %     Weight --      Height --      Head Circumference --      Peak Flow --      Pain Score 05/17/22 1956 0     Pain Loc --      Pain Edu? --      Excl. in [place]? --    No data found.  Updated Vital Signs BP 133/76 (BP Location: Right Arm) Comment (BP Location): large cuff  Pulse 61   Temp 98.1 F (36.7 C) (Oral)   Resp 20   SpO2 96%   Visual Acuity Right Eye Distance:   Left Eye Distance:   Bilateral Distance:    Right Eye Near:   Left Eye Near:    Bilateral Near:     Physical Exam Vitals reviewed.  Constitutional:      General: He is awake.     Appearance: Normal  appearance. He is well-developed. He is not ill-appearing.     Comments: Very pleasant male appears stated age in no acute distress sitting comfortably in exam room  HENT:     Head: Normocephalic and atraumatic.     Right Ear: External ear normal. There is impacted cerumen.     Left Ear: External ear normal. There is impacted cerumen.     Ears:     Comments: Cerumen impaction noted bilaterally; unable to visualize TM.    Nose: Nose normal.     Mouth/Throat:     Pharynx: Uvula midline. No oropharyngeal exudate or posterior oropharyngeal erythema.  Cardiovascular:     Rate and Rhythm: Normal rate and regular rhythm.     Heart sounds: Normal heart sounds, S1 normal and S2 normal. No murmur heard. Pulmonary:     Effort: Pulmonary effort is normal. No accessory muscle usage or respiratory distress.     Breath sounds: Normal breath sounds. No stridor. No wheezing, rhonchi or rales.     Comments: Clear to auscultation bilaterally Neurological:     Mental Status: He is alert.  Psychiatric:        Behavior: Behavior is cooperative.      UC Treatments / Results  Labs (all labs ordered are listed, but only abnormal results are displayed) Labs Reviewed  SARS CORONAVIRUS 2 (TAT 6-24 HRS)    EKG   Radiology No results found.  Procedures Procedures (including critical care time)  Medications Ordered in UC Medications - No data to display  Initial Impression / Assessment and Plan / UC Course  I have reviewed the triage vital signs and the nursing notes.  Pertinent labs & imaging results that were available during my care of the patient were reviewed by me and considered in my medical decision making (see chart for details).     Patient is well-appearing, afebrile, nontoxic, nontachycardic.  Patient was tested for COVID-19 and results are pending.  He requested influenza and RSV testing discussed that we do not have this capability in urgent care at this time.  He is currently  asymptomatic but encouraged him to return if he develops any symptoms.  He was instructed to monitor his MyChart for his results we will contact him if he is positive.  Discussed that if he develops any severe symptoms including chest pain, shortness of breath, fever, nausea/vomiting interfere with oral intake, weakness he needs to be seen immediately.  Strict return precautions given.  Work excuse note with current CDC return to work guidelines based on COVID test result provided during visit today.  Final Clinical Impressions(s) / UC Diagnoses   Final diagnoses:  Close exposure to COVID-19 virus  Encounter for laboratory testing for COVID-19 virus     Discharge Instructions      Monitor your MyChart for results.  We will contact you if you are COVID-positive.  You can return to work based on your COVID test result as outlined in the work excuse note that you were provided today.  If you develop any symptoms I recommend you come back for reevaluation.  If you have anything severe including chest pain, shortness of breath, high fever, nausea/vomiting interfering with oral intake you should be seen immediately.     ED Prescriptions   None    PDMP not reviewed this encounter.   Terrilee Croak, PA-C 05/17/22 2018

## 2022-05-17 NOTE — Discharge Instructions (Signed)
Monitor your MyChart for results.  We will contact you if you are COVID-positive.  You can return to work based on your COVID test result as outlined in the work excuse note that you were provided today.  If you develop any symptoms I recommend you come back for reevaluation.  If you have anything severe including chest pain, shortness of breath, high fever, nausea/vomiting interfering with oral intake you should be seen immediately.
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
## 2022-05-18 LAB — SARS CORONAVIRUS 2 (TAT 6-24 HRS): SARS Coronavirus 2: NEGATIVE

## 2022-08-02 ENCOUNTER — Encounter (HOSPITAL_COMMUNITY): Payer: Self-pay

## 2022-08-02 ENCOUNTER — Ambulatory Visit (HOSPITAL_COMMUNITY)
Admission: RE | Admit: 2022-08-02 | Discharge: 2022-08-02 | Disposition: A | Discharge status: Home / Self Care | Payer: Medicaid Other | Source: Ambulatory Visit | Attending: Urgent Care | Admitting: Urgent Care

## 2022-08-02 VITALS — BP 120/89 | HR 69 | Temp 98.4°F | Resp 18

## 2022-08-02 DIAGNOSIS — G43009 Migraine without aura, not intractable, without status migrainosus: Secondary | ICD-10-CM | POA: Diagnosis not present

## 2022-08-02 MED ORDER — DEXAMETHASONE SODIUM PHOSPHATE 10 MG/ML IJ SOLN
INTRAMUSCULAR | Status: AC
  Filled 2022-08-02: qty 1

## 2022-08-02 MED ORDER — KETOROLAC TROMETHAMINE 30 MG/ML IJ SOLN
30.0000 mg | Freq: Once | INTRAMUSCULAR | Status: AC
  Administered 2022-08-02: 30 mg via INTRAMUSCULAR

## 2022-08-02 MED ORDER — DEXAMETHASONE SODIUM PHOSPHATE 10 MG/ML IJ SOLN
10.0000 mg | Freq: Once | INTRAMUSCULAR | Status: AC
  Administered 2022-08-02: 10 mg via INTRAMUSCULAR

## 2022-08-02 MED ORDER — RIZATRIPTAN BENZOATE 10 MG PO TBDP: 10.0000 mg | ORAL_TABLET | ORAL | 0 refills | Status: DC | PRN

## 2022-08-02 MED ORDER — METOCLOPRAMIDE HCL 5 MG/ML IJ SOLN
5.0000 mg | Freq: Once | INTRAMUSCULAR | Status: AC
  Administered 2022-08-02: 5 mg via INTRAMUSCULAR

## 2022-08-02 MED ORDER — KETOROLAC TROMETHAMINE 30 MG/ML IJ SOLN
INTRAMUSCULAR | Status: AC
  Filled 2022-08-02: qty 1

## 2022-08-02 MED ORDER — METOCLOPRAMIDE HCL 5 MG/ML IJ SOLN
INTRAMUSCULAR | Status: AC
  Filled 2022-08-02: qty 2

## 2022-08-02 NOTE — ED Provider Notes (Signed)
[facility]    CSN: [id number] Arrival date & time: 08/02/22  7829      History   Chief Complaint Chief Complaint  Patient presents with   Headache   Letter for School/Work    HPI Manuel Silva is a 37 y.o. male.   37 year old male presents today due to concerns of a migraine.  He states he does not consistently have migraines, last one was a while ago.  Last evening, he missed work due to an 8 out of 10 pain in his head that he describes around his left eye region.  He stated some nausea but no vomiting.  Slight blurring of his vision, but states he is still able to see.  Denies any fever or nuchal rigidity.  No sinus pain, sore throat, sneezing or URI symptoms.  He took ibuprofen last evening, states that his pain is now down to a 6 out of 10.  Patient denies gait disturbance, weakness, slurred speech, facial abnormalities, dizziness, tinnitus, or any additional neurological symptoms.   Headache   Past Medical History:  Diagnosis Date   Back pain    Gastritis     There are no problems to display for this patient.   History reviewed. No pertinent surgical history.     Home Medications    Prior to Admission medications   Medication Sig Start Date End Date Taking? Authorizing Provider  Oxycodone HCl 10 MG TABS Take 10 mg by mouth every 8 (eight) hours as needed. 04/24/22  Yes [provider]  rizatriptan (MAXALT-MLT) 10 MG disintegrating tablet Take 1 tablet (10 mg total) by mouth as needed for migraine. May repeat in 2 hours if needed 08/02/22  Yes Christl Fessenden, Jodelle Gross, PA    Family History Family History  Problem Relation Age of Onset   Healthy Mother    Healthy Father     Social History Social History   Tobacco Use   Smoking status: Some Days    Types: Cigarettes   Smokeless tobacco: Never  Vaping Use   Vaping Use: Some days  Substance Use Topics   Alcohol use: Not Currently    Comment: occasionally   Drug use: No      Allergies   Penicillins   Review of Systems Review of Systems  Neurological:  Positive for headaches.  As per HPI   Physical Exam Triage Vital Signs ED Triage Vitals  Enc Vitals Group     BP 08/02/22 0854 120/89     Pulse Rate 08/02/22 0854 69     Resp 08/02/22 0854 18     Temp 08/02/22 0854 98.4 F (36.9 C)     Temp Source 08/02/22 0854 Oral     SpO2 08/02/22 0854 98 %     Weight --      Height --      Head Circumference --      Peak Flow --      Pain Score 08/02/22 0853 6     Pain Loc --      Pain Edu? --      Excl. in [place]? --    No data found.  Updated Vital Signs BP 120/89 (BP Location: Left Arm)   Pulse 69   Temp 98.4 F (36.9 C) (Oral)   Resp 18   SpO2 98%   Visual Acuity Right Eye Distance: 20/50 Left Eye Distance: 20/50 Bilateral Distance: 20/50 (forgot glasses)  Right Eye Near:   Left Eye Near:  Bilateral Near:     Physical Exam Vitals and nursing note reviewed.  Constitutional:      General: He is not in acute distress.    Appearance: Normal appearance. He is well-developed and normal weight. He is not ill-appearing or toxic-appearing.  HENT:     Head: Normocephalic and atraumatic.     Right Ear: Tympanic membrane, ear canal and external ear normal. There is no impacted cerumen.     Left Ear: Tympanic membrane, ear canal and external ear normal. There is no impacted cerumen.     Nose: Nose normal. No congestion or rhinorrhea.     Mouth/Throat:     Mouth: Mucous membranes are moist.     Pharynx: Oropharynx is clear. No oropharyngeal exudate or posterior oropharyngeal erythema.  Eyes:     General: No visual field deficit or scleral icterus.       Right eye: No discharge.        Left eye: No discharge.     Extraocular Movements: Extraocular movements intact.     Right eye: Normal extraocular motion.     Left eye: Normal extraocular motion.     Conjunctiva/sclera: Conjunctivae normal.     Pupils: Pupils are equal, round, and  reactive to light. Pupils are equal.  Neck:     Meningeal: Brudzinski's sign absent.  Cardiovascular:     Rate and Rhythm: Normal rate and regular rhythm.     Pulses: Normal pulses.     Heart sounds: No murmur heard. Pulmonary:     Effort: Pulmonary effort is normal. No respiratory distress.     Breath sounds: Normal breath sounds. No stridor. No wheezing, rhonchi or rales.  Chest:     Chest wall: No tenderness.  Musculoskeletal:        General: No swelling, tenderness, deformity or signs of injury. Normal range of motion.     Cervical back: Normal range of motion and neck supple. No rigidity or tenderness.     Right lower leg: No edema.  Lymphadenopathy:     Cervical: No cervical adenopathy.  Skin:    General: Skin is warm.     Coloration: Skin is not jaundiced.     Findings: No bruising, erythema, lesion or rash.  Neurological:     General: No focal deficit present.     Mental Status: He is alert and oriented to person, place, and time. Mental status is at baseline.     Cranial Nerves: Cranial nerves 2-12 are intact. No cranial nerve deficit, dysarthria or facial asymmetry.     Sensory: Sensation is intact. No sensory deficit.     Motor: Motor function is intact. No weakness, tremor, atrophy, abnormal muscle tone or pronator drift.     Coordination: Coordination is intact. Romberg sign negative. Coordination normal. Finger-Nose-Finger Test normal.     Gait: Gait normal.     Deep Tendon Reflexes: Reflexes are normal and symmetric. Reflexes normal.  Psychiatric:        Mood and Affect: Mood normal.        Speech: Speech normal.        Behavior: Behavior normal.        Thought Content: Thought content normal.        Judgment: Judgment normal.      UC Treatments / Results  Labs (all labs ordered are listed, but only abnormal results are displayed) Labs Reviewed - No data to display  EKG   Radiology No results found.  Procedures Procedures (including  critical care  time)  Medications Ordered in UC Medications  ketorolac (TORADOL) 30 MG/ML injection 30 mg (30 mg Intramuscular Given 08/02/22 0935)  metoCLOPramide (REGLAN) injection 5 mg (5 mg Intramuscular Given 08/02/22 0935)  dexamethasone (DECADRON) injection 10 mg (10 mg Intramuscular Given 08/02/22 0935)    Initial Impression / Assessment and Plan / UC Course  I have reviewed the triage vital signs and the nursing notes.  Pertinent labs & imaging results that were available during my care of the patient were reviewed by me and considered in my medical decision making (see chart for details).     L ocular migraine - pt denying red flag s/sx in office, normal neuro exam. VSS. Migraine cocktail given in office, will have pt out of work today, return tomorrow. Take SL rizatriptan PRN migraines. F/U with PCP if these become persistent. Any new or worsening sx, head to ER.   Final Clinical Impressions(s) / UC Diagnoses   Final diagnoses:  Migraine without aura and without status migrainosus, not intractable     Discharge Instructions      You were treated in our clinic today for a migraine headache. You were given an injection of ketorolac, dexamethasone, Reglan. This should help with the pain and the nausea.  If your symptoms persist, I have called in a medication called rizatriptan.  You may place this under your tongue every 24 hours if needed.  If your symptoms do not improve, or if new symptoms occur, please head to the emergency room.     ED Prescriptions     Medication Sig Dispense Auth. Provider   rizatriptan (MAXALT-MLT) 10 MG disintegrating tablet Take 1 tablet (10 mg total) by mouth as needed for migraine. May repeat in 2 hours if needed 10 tablet Sharel Behne, Far[place] L, [place]      PDMP not reviewed this encounter.   Maretta Bees, [place] 08/02/22 2127

## 2022-08-02 NOTE — ED Triage Notes (Signed)
Pt states he had a migraine last night and he had to call out of work. He states he was nauseous and had to stay in a dark room last night but today its much better. He looks some IBU. He needs a note for work .

## 2022-08-02 NOTE — Discharge Instructions (Addendum)
You were treated in our clinic today for a migraine headache. You were given an injection of ketorolac, dexamethasone, Reglan. This should help with the pain and the nausea.  If your symptoms persist, I have called in a medication called rizatriptan.  You may place this under your tongue every 24 hours if needed.  If your symptoms do not improve, or if new symptoms occur, please head to the emergency room.

## 2022-08-16 ENCOUNTER — Encounter (HOSPITAL_COMMUNITY): Payer: Self-pay

## 2022-08-16 ENCOUNTER — Ambulatory Visit (HOSPITAL_COMMUNITY)
Admission: RE | Admit: 2022-08-16 | Discharge: 2022-08-16 | Disposition: A | Discharge status: Home / Self Care | Payer: Medicaid Other | Source: Ambulatory Visit | Attending: Internal Medicine | Admitting: Internal Medicine

## 2022-08-16 VITALS — BP 126/75 | HR 57 | Temp 98.5°F | Resp 20 | Wt 210.0 lb

## 2022-08-16 DIAGNOSIS — A059 Bacterial foodborne intoxication, unspecified: Secondary | ICD-10-CM | POA: Diagnosis not present

## 2022-08-16 MED ORDER — ONDANSETRON 4 MG PO TBDP: 4.0000 mg | ORAL_TABLET | Freq: Three times a day (TID) | ORAL | 0 refills | Status: DC | PRN

## 2022-08-16 MED ORDER — PANTOPRAZOLE SODIUM 20 MG PO TBEC: 20.0000 mg | DELAYED_RELEASE_TABLET | Freq: Every day | ORAL | 0 refills | Status: DC

## 2022-08-16 NOTE — Discharge Instructions (Addendum)
Increase oral fluid intake Please take medications as prescribed If you have worsening symptoms please return to urgent care to be reevaluated.

## 2022-08-16 NOTE — ED Triage Notes (Signed)
Pt states that he ate some food from a restaurant yesterday and has had vomiting nausea and diarrhea. Hasn't taken anything. 7/10 pain

## 2022-08-17 NOTE — ED Provider Notes (Signed)
[facility]    CSN: [id number] Arrival date & time: 08/16/22  1925      History   Chief Complaint No chief complaint on file.   HPI DANDRA DASHNAW is a 37 y.o. male comes to urgent care with 1 day history of nausea, nonbloody nonbilious vomiting and a few episodes of nonbloody nonmucoid diarrhea.  Patient's symptoms started after he ate at a restaurant yesterday.  According to the patient the food he ate did not taste good.  He shared the food with his children who are experiencing similar symptoms.  No fever or chills.  He has some abdominal discomfort intermittently with abdominal cramping.  Oral intake has been poor.  No abdominal bloating or distention.  No dizziness, near syncope or syncopal episodes.   HPI  Past Medical History:  Diagnosis Date   Back pain    Gastritis     There are no problems to display for this patient.   History reviewed. No pertinent surgical history.     Home Medications    Prior to Admission medications   Medication Sig Start Date End Date Taking? Authorizing Provider  ondansetron (ZOFRAN-ODT) 4 MG disintegrating tablet Take 1 tablet (4 mg total) by mouth every 8 (eight) hours as needed for nausea or vomiting. 08/16/22  Yes Kiley Solimine, Britta Mccreedy, MD  pantoprazole (PROTONIX) 20 MG tablet Take 1 tablet (20 mg total) by mouth daily. 08/16/22  Yes Eliab Closson, Britta Mccreedy, MD    Family History Family History  Problem Relation Age of Onset   Healthy Mother    Healthy Father     Social History Social History   Tobacco Use   Smoking status: Some Days    Types: Cigarettes   Smokeless tobacco: Never  Vaping Use   Vaping Use: Some days  Substance Use Topics   Alcohol use: Not Currently    Comment: occasionally   Drug use: No     Allergies   Penicillins   Review of Systems Review of Systems As per HPI  Physical Exam Triage Vital Signs ED Triage Vitals  Enc Vitals Group     BP 08/16/22 2014 126/75     Pulse Rate  08/16/22 2014 (!) 57     Resp 08/16/22 2014 20     Temp 08/16/22 2014 98.5 F (36.9 C)     Temp Source 08/16/22 2014 Oral     SpO2 08/16/22 2014 97 %     Weight 08/16/22 2013 210 lb (95.3 kg)     Height --      Head Circumference --      Peak Flow --      Pain Score 08/16/22 2013 6     Pain Loc --      Pain Edu? --      Excl. in GC? --    No data found.  Updated Vital Signs BP 126/75 (BP Location: Right Arm)   Pulse (!) 57   Temp 98.5 F (36.9 C) (Oral)   Resp 20   Wt 95.3 kg   SpO2 97%   BMI 31.01 kg/m   Visual Acuity Right Eye Distance:   Left Eye Distance:   Bilateral Distance:    Right Eye Near:   Left Eye Near:    Bilateral Near:     Physical Exam Vitals and nursing note reviewed.  Constitutional:      General: He is not in acute distress.    Appearance: He is not ill-appearing.  Cardiovascular:  Rate and Rhythm: Normal rate and regular rhythm.     Pulses: Normal pulses.     Heart sounds: Normal heart sounds.  Pulmonary:     Effort: Pulmonary effort is normal.     Breath sounds: Normal breath sounds.  Abdominal:     General: Bowel sounds are normal. There is no distension.     Palpations: Abdomen is soft. There is no mass.     Tenderness: There is no abdominal tenderness. There is no guarding or rebound.     Hernia: No hernia is present.  Neurological:     Mental Status: He is alert.      UC Treatments / Results  Labs (all labs ordered are listed, but only abnormal results are displayed) Labs Reviewed - No data to display  EKG   Radiology No results found.  Procedures Procedures (including critical care time)  Medications Ordered in UC Medications - No data to display  Initial Impression / Assessment and Plan / UC Course  I have reviewed the triage vital signs and the nursing notes.  Pertinent labs & imaging results that were available during my care of the patient were reviewed by me and considered in my medical decision making  (see chart for details).     1.  Food poisoning: Protonix 40 mg orally daily Zofran and ODT every 6 hours as needed for nausea/vomiting Patient is advised to increase oral fluid intake Return precautions given. Final Clinical Impressions(s) / UC Diagnoses   Final diagnoses:  Food poisoning     Discharge Instructions      Increase oral fluid intake Please take medications as prescribed If you have worsening symptoms please return to urgent care to be reevaluated.   ED Prescriptions     Medication Sig Dispense Auth. Provider   pantoprazole (PROTONIX) 20 MG tablet Take 1 tablet (20 mg total) by mouth daily. 14 tablet Jeannifer Drakeford, Britta Mccreedy, MD   ondansetron (ZOFRAN-ODT) 4 MG disintegrating tablet Take 1 tablet (4 mg total) by mouth every 8 (eight) hours as needed for nausea or vomiting. 20 tablet Hassen Bruun, Britta Mccreedy, MD      PDMP not reviewed this encounter.   Merrilee Jansky, MD 08/17/22 [phone]

## 2022-12-17 ENCOUNTER — Other Ambulatory Visit: Payer: Self-pay

## 2022-12-17 ENCOUNTER — Ambulatory Visit (HOSPITAL_COMMUNITY)
Admission: RE | Admit: 2022-12-17 | Discharge: 2022-12-17 | Disposition: A | Discharge status: Home / Self Care | Payer: Medicaid Other | Source: Ambulatory Visit | Attending: Family Medicine | Admitting: Family Medicine

## 2022-12-17 ENCOUNTER — Encounter (HOSPITAL_COMMUNITY): Payer: Self-pay

## 2022-12-17 VITALS — BP 122/75 | HR 64 | Temp 98.2°F | Resp 20

## 2022-12-17 DIAGNOSIS — R059 Cough, unspecified: Secondary | ICD-10-CM | POA: Diagnosis not present

## 2022-12-17 DIAGNOSIS — B9789 Other viral agents as the cause of diseases classified elsewhere: Secondary | ICD-10-CM | POA: Insufficient documentation

## 2022-12-17 DIAGNOSIS — Z1152 Encounter for screening for COVID-19: Secondary | ICD-10-CM | POA: Diagnosis not present

## 2022-12-17 DIAGNOSIS — J069 Acute upper respiratory infection, unspecified: Secondary | ICD-10-CM | POA: Diagnosis not present

## 2022-12-17 MED ORDER — BENZONATATE 100 MG PO CAPS: 100.0000 mg | ORAL_CAPSULE | Freq: Three times a day (TID) | ORAL | 0 refills | Status: DC | PRN

## 2022-12-17 NOTE — Discharge Instructions (Signed)
Take benzonatate 100 mg, 1 tab every 8 hours as needed for cough.   You have been swabbed for COVID, and the test will result in the next 24 hours. Our staff will call you if positive. If the COVID test is positive, you should quarantine until you are fever free for 24 hours and you are starting to feel better, and then take added precautions for the next 5 days, such as physical distancing/wearing a mask and good hand hygiene/washing.  

## 2022-12-17 NOTE — ED Provider Notes (Signed)
[facility]    CSN: [id number] Arrival date & time: 12/17/22  1611      History   Chief Complaint Chief Complaint  Patient presents with   Cough   Appointment    4:00    HPI Manuel Silva is a 37 y.o. male.    Cough Here for cough and nasal congestion that began about 2 days ago.  His throat has been scratchy.  His son has had similar symptoms beginning 5 to 6 days ago.  No fever or chills and no vomiting or diarrhea.  He is allergic to penicillin  Past Medical History:  Diagnosis Date   Back pain    Gastritis     There are no problems to display for this patient.   History reviewed. No pertinent surgical history.     Home Medications    Prior to Admission medications   Medication Sig Start Date End Date Taking? Authorizing Provider  benzonatate (TESSALON) 100 MG capsule Take 1 capsule (100 mg total) by mouth 3 (three) times daily as needed for cough. 12/17/22  Yes Dayshawn Irizarry, Janace Aris, MD    Family History Family History  Problem Relation Age of Onset   Healthy Mother    Healthy Father     Social History Social History   Tobacco Use   Smoking status: Some Days    Types: Cigarettes   Smokeless tobacco: Never  Vaping Use   Vaping status: Some Days  Substance Use Topics   Alcohol use: Not Currently    Comment: occasionally   Drug use: No     Allergies   Penicillins   Review of Systems Review of Systems  Respiratory:  Positive for cough.      Physical Exam Triage Vital Signs ED Triage Vitals  Encounter Vitals Group     BP 12/17/22 1701 122/75     Systolic BP Percentile --      Diastolic BP Percentile --      Pulse Rate 12/17/22 1701 64     Resp 12/17/22 1701 20     Temp 12/17/22 1701 98.2 F (36.8 C)     Temp Source 12/17/22 1701 Oral     SpO2 12/17/22 1701 96 %     Weight --      Height --      Head Circumference --      Peak Flow --      Pain Score 12/17/22 1659 5     Pain Loc --      Pain Education --       Exclude from Growth Chart --    No data found.  Updated Vital Signs BP 122/75 (BP Location: Right Arm)   Pulse 64   Temp 98.2 F (36.8 C) (Oral)   Resp 20   SpO2 96%   Visual Acuity Right Eye Distance:   Left Eye Distance:   Bilateral Distance:    Right Eye Near:   Left Eye Near:    Bilateral Near:     Physical Exam Vitals reviewed.  Constitutional:      General: He is not in acute distress.    Appearance: He is not ill-appearing, toxic-appearing or diaphoretic.  HENT:     Ears:     Comments: Tympanic membrane's are bilaterally obscured by cerumen    Nose: Congestion present.     Mouth/Throat:     Mouth: Mucous membranes are moist.     Comments: There is some clear mucus draining. Eyes:  Extraocular Movements: Extraocular movements intact.     Conjunctiva/sclera: Conjunctivae normal.     Pupils: Pupils are equal, round, and reactive to light.  Cardiovascular:     Rate and Rhythm: Normal rate and regular rhythm.     Heart sounds: No murmur heard. Pulmonary:     Effort: Pulmonary effort is normal. No respiratory distress.     Breath sounds: Normal breath sounds. No stridor. No wheezing, rhonchi or rales.  Musculoskeletal:     Cervical back: Neck supple.  Lymphadenopathy:     Cervical: No cervical adenopathy.  Skin:    Capillary Refill: Capillary refill takes less than 2 seconds.     Coloration: Skin is not jaundiced or pale.  Neurological:     General: No focal deficit present.     Mental Status: He is alert and oriented to person, place, and time.  Psychiatric:        Behavior: Behavior normal.      UC Treatments / Results  Labs (all labs ordered are listed, but only abnormal results are displayed) Labs Reviewed  SARS CORONAVIRUS 2 (TAT 6-24 HRS)    EKG   Radiology No results found.  Procedures Procedures (including critical care time)  Medications Ordered in UC Medications - No data to display  Initial Impression / Assessment and  Plan / UC Course  I have reviewed the triage vital signs and the nursing notes.  Pertinent labs & imaging results that were available during my care of the patient were reviewed by me and considered in my medical decision making (see chart for details).      COVID swab is done and if positive we will notify him and he will know if he needs to isolate.  Tessalon Perles sent in for cough. Final Clinical Impressions(s) / UC Diagnoses   Final diagnoses:  Viral URI with cough     Discharge Instructions      Take benzonatate 100 mg, 1 tab every 8 hours as needed for cough.   You have been swabbed for COVID, and the test will result in the next 24 hours. Our staff will call you if positive. If the COVID test is positive, you should quarantine until you are fever free for 24 hours and you are starting to feel better, and then take added precautions for the next 5 days, such as physical distancing/wearing a mask and good hand hygiene/washing.      ED Prescriptions     Medication Sig Dispense Auth. Provider   benzonatate (TESSALON) 100 MG capsule Take 1 capsule (100 mg total) by mouth 3 (three) times daily as needed for cough. 21 capsule Zenia Resides, MD      PDMP not reviewed this encounter.   Zenia Resides, MD 12/17/22 1739

## 2022-12-17 NOTE — ED Triage Notes (Signed)
Headache, cough , sneezing for 2 days.  Requesting a covid test  Patient is in department with child as a patient as well

## 2022-12-18 LAB — SARS CORONAVIRUS 2 (TAT 6-24 HRS): SARS Coronavirus 2: NEGATIVE

## 2022-12-20 ENCOUNTER — Ambulatory Visit (HOSPITAL_COMMUNITY): Payer: Medicaid Other

## 2022-12-22 ENCOUNTER — Ambulatory Visit (HOSPITAL_COMMUNITY): Payer: Medicaid Other

## 2022-12-25 ENCOUNTER — Ambulatory Visit (HOSPITAL_COMMUNITY)
Admission: EM | Admit: 2022-12-25 | Discharge: 2022-12-25 | Disposition: A | Discharge status: Home / Self Care | Payer: Medicaid Other | Attending: Internal Medicine | Admitting: Internal Medicine

## 2022-12-25 ENCOUNTER — Encounter (HOSPITAL_COMMUNITY): Payer: Self-pay | Admitting: Emergency Medicine

## 2022-12-25 DIAGNOSIS — J069 Acute upper respiratory infection, unspecified: Secondary | ICD-10-CM | POA: Diagnosis not present

## 2022-12-25 DIAGNOSIS — Z1152 Encounter for screening for COVID-19: Secondary | ICD-10-CM | POA: Diagnosis not present

## 2022-12-25 DIAGNOSIS — B9789 Other viral agents as the cause of diseases classified elsewhere: Secondary | ICD-10-CM | POA: Insufficient documentation

## 2022-12-25 DIAGNOSIS — F17210 Nicotine dependence, cigarettes, uncomplicated: Secondary | ICD-10-CM | POA: Insufficient documentation

## 2022-12-25 DIAGNOSIS — R059 Cough, unspecified: Secondary | ICD-10-CM | POA: Diagnosis present

## 2022-12-25 MED ORDER — BENZONATATE 100 MG PO CAPS: 100.0000 mg | ORAL_CAPSULE | Freq: Three times a day (TID) | ORAL | 0 refills | Status: DC

## 2022-12-25 MED ORDER — PROMETHAZINE-DM 6.25-15 MG/5ML PO SYRP: 5.0000 mL | ORAL_SOLUTION | Freq: Every evening | ORAL | 0 refills | Status: DC | PRN

## 2022-12-25 MED ORDER — GUAIFENESIN ER 1200 MG PO TB12: 1200.0000 mg | ORAL_TABLET | Freq: Two times a day (BID) | ORAL | 0 refills | Status: DC

## 2022-12-25 NOTE — ED Provider Notes (Signed)
[facility]    CSN: [id number] Arrival date & time: 12/25/22  1854      History   Chief Complaint Chief Complaint  Patient presents with   Cough   Nasal Congestion    HPI Manuel Silva is a 37 y.o. male.   Manuel Silva is a 37 y.o. male presenting for chief complaint of cough, nasal congestion, sore throat, body aches, headache and fever/chills that started 2-3 days ago. His son was recently diangosed with RSV and patient wonders if he could have same. Cough is sometimes productive but mostly dry. States his "nose is on fire" with significant nasal congestion. No N/V/D, abdominal pain, Silva, dizziness, or known documented fever at home. Former cigarette smoker, current everyday vape user, denies other drug use. Denies history of chronic respiratory problems. Taking OTC medications without much relief.   Cough   Past Medical History:  Diagnosis Date   Back pain    Gastritis     There are no problems to display for this patient.   History reviewed. No pertinent surgical history.     Home Medications    Prior to Admission medications   Medication Sig Start Date End Date Taking? Authorizing Provider  benzonatate (TESSALON) 100 MG capsule Take 1 capsule (100 mg total) by mouth every 8 (eight) hours. 12/25/22  Yes Carlisle Beers, FNP  Guaifenesin 1200 MG TB12 Take 1 tablet (1,200 mg total) by mouth in the morning and at bedtime. 12/25/22  Yes Carlisle Beers, FNP  promethazine-dextromethorphan (PROMETHAZINE-DM) 6.25-15 MG/5ML syrup Take 5 mLs by mouth at bedtime as needed for cough. 12/25/22  Yes Keyari Kleeman, Donavan Burnet, FNP    Family History Family History  Problem Relation Age of Onset   Healthy Mother    Healthy Father     Social History Social History   Tobacco Use   Smoking status: Some Days    Types: Cigarettes   Smokeless tobacco: Never  Vaping Use   Vaping status: Some Days  Substance Use Topics   Alcohol use: Not  Currently    Comment: occasionally   Drug use: No     Allergies   Penicillins   Review of Systems Review of Systems  Respiratory:  Positive for cough.   Per HPI   Physical Exam Triage Vital Signs ED Triage Vitals [12/25/22 1920]  Encounter Vitals Group     BP 115/77     Systolic BP Percentile      Diastolic BP Percentile      Pulse Rate 60     Resp 16     Temp 97.9 F (36.6 C)     Temp Source Oral     SpO2 97 %     Weight      Height      Head Circumference      Peak Flow      Pain Score 5     Pain Loc      Pain Education      Exclude from Growth Chart    No data found.  Updated Vital Signs BP 115/77 (BP Location: Left Arm)   Pulse 60   Temp 97.9 F (36.6 C) (Oral)   Resp 16   SpO2 97%   Visual Acuity Right Eye Distance:   Left Eye Distance:   Bilateral Distance:    Right Eye Near:   Left Eye Near:    Bilateral Near:     Physical Exam Vitals and nursing note reviewed.  Constitutional:      Appearance: He is not ill-appearing or toxic-appearing.  HENT:     Head: Normocephalic and atraumatic.     Right Ear: Hearing, tympanic membrane, ear canal and external ear normal.     Left Ear: Hearing, tympanic membrane, ear canal and external ear normal.     Nose: Congestion present.     Mouth/Throat:     Lips: Pink.     Mouth: Mucous membranes are moist. No injury.     Tongue: No lesions. Tongue does not deviate from midline.     Palate: No mass and lesions.     Pharynx: Oropharynx is clear. Uvula midline. Posterior oropharyngeal erythema present. No pharyngeal swelling, oropharyngeal exudate or uvula swelling.     Tonsils: No tonsillar exudate or tonsillar abscesses.  Eyes:     General: Lids are normal. Vision grossly intact. Gaze aligned appropriately.     Extraocular Movements: Extraocular movements intact.     Conjunctiva/sclera: Conjunctivae normal.  Cardiovascular:     Rate and Rhythm: Normal rate and regular rhythm.     Heart sounds: Normal  heart sounds, S1 normal and S2 normal.  Pulmonary:     Effort: Pulmonary effort is normal. No respiratory distress.     Breath sounds: Normal breath sounds and air entry. No wheezing, rhonchi or rales.  Chest:     Chest wall: No tenderness.  Musculoskeletal:     Cervical back: Neck supple.  Skin:    General: Skin is warm and dry.     Capillary Refill: Capillary refill takes less than 2 seconds.     Findings: No Silva.  Neurological:     General: No focal deficit present.     Mental Status: He is alert and oriented to person, place, and time. Mental status is at baseline.     Cranial Nerves: No dysarthria or facial asymmetry.  Psychiatric:        Mood and Affect: Mood normal.        Speech: Speech normal.        Behavior: Behavior normal.        Thought Content: Thought content normal.        Judgment: Judgment normal.      UC Treatments / Results  Labs (all labs ordered are listed, but only abnormal results are displayed) Labs Reviewed  SARS CORONAVIRUS 2 (TAT 6-24 HRS)    EKG   Radiology No results found.  Procedures Procedures (including critical care time)  Medications Ordered in UC Medications - No data to display  Initial Impression / Assessment and Plan / UC Course  I have reviewed the triage vital signs and the nursing notes.  Pertinent labs & imaging results that were available during my care of the patient were reviewed by me and considered in my medical decision making (see chart for details).   1. Viral URI with cough, cigarette nicotine dependence uncomplicated Suspect viral URI, viral syndrome. Physical exam findings reassuring, vital signs hemodynamically stable. Low suspicion for pneumonia/acute cardiopulmonary abnormality, therefore deferred imaging of the chest. Advised supportive care, offered prescriptions for symptomatic relief.  Recommend continued use of OTC medications as needed, recommendations discussed with patient/caregiver and outlined  in AVS.  Strep/viral testing: COVID testing pending, CDC guidelines discussed.  Counseled patient on potential for adverse effects with medications prescribed/recommended today, strict ER and return-to-clinic precautions discussed, patient verbalized understanding.   Final Clinical Impressions(s) / UC Diagnoses   Final diagnoses:  Viral URI with cough  Cigarette nicotine dependence without complication     Discharge Instructions      You have a viral illness which will improve on its own with rest, fluids, and medications to help with your symptoms. We discussed prescriptions that may help with your symptoms: tessalon perles as needed You may use over the counter medicines as needed: tylenol/motrin, mucinex, zyrtec, Flonase Two teaspoons of honey in 1 cup of warm water every 4-6 hours may help with throat pains. Humidifier in room at nighttime may help soothe cough (clean well daily).   For chest pain, shortness of breath, inability to keep food or fluids down without vomiting, fever that does not respond to tylenol or motrin, or any other severe symptoms, please go to the ER for further evaluation. Return to urgent care as needed, otherwise follow-up with PCP.       ED Prescriptions     Medication Sig Dispense Auth. Provider   benzonatate (TESSALON) 100 MG capsule Take 1 capsule (100 mg total) by mouth every 8 (eight) hours. 21 capsule Carlisle Beers, FNP   promethazine-dextromethorphan (PROMETHAZINE-DM) 6.25-15 MG/5ML syrup Take 5 mLs by mouth at bedtime as needed for cough. 118 mL Reita May M, FNP   Guaifenesin 1200 MG TB12 Take 1 tablet (1,200 mg total) by mouth in the morning and at bedtime. 14 tablet Carlisle Beers, FNP      PDMP not reviewed this encounter.   Carlisle Beers, [place] 12/25/22 2207

## 2022-12-25 NOTE — Discharge Instructions (Signed)
You have a viral illness which will improve on its own with rest, fluids, and medications to help with your symptoms. We discussed prescriptions that may help with your symptoms: tessalon perles as needed You may use over the counter medicines as needed: tylenol/motrin, mucinex, zyrtec, Flonase Two teaspoons of honey in 1 cup of warm water every 4-6 hours may help with throat pains. Humidifier in room at nighttime may help soothe cough (clean well daily).   For chest pain, shortness of breath, inability to keep food or fluids down without vomiting, fever that does not respond to tylenol or motrin, or any other severe symptoms, please go to the ER for further evaluation. Return to urgent care as needed, otherwise follow-up with PCP.

## 2022-12-25 NOTE — ED Triage Notes (Signed)
Pt reports for 3 days having runny nose or it will be stopped up, cough, and scratchy throat. Reports that son recently had RSV. Pt reports taking Robitussin and Promethazine cough syrup.

## 2022-12-26 LAB — SARS CORONAVIRUS 2 (TAT 6-24 HRS): SARS Coronavirus 2: NEGATIVE

## 2023-01-12 ENCOUNTER — Ambulatory Visit (HOSPITAL_COMMUNITY)
Admission: EM | Admit: 2023-01-12 | Discharge: 2023-01-12 | Disposition: A | Discharge status: Home / Self Care | Payer: Medicaid Other | Attending: Family Medicine | Admitting: Family Medicine

## 2023-01-12 ENCOUNTER — Ambulatory Visit (HOSPITAL_COMMUNITY): Payer: Medicaid Other

## 2023-01-12 ENCOUNTER — Encounter (HOSPITAL_COMMUNITY): Payer: Self-pay | Admitting: Emergency Medicine

## 2023-01-12 DIAGNOSIS — J029 Acute pharyngitis, unspecified: Secondary | ICD-10-CM | POA: Diagnosis not present

## 2023-01-12 DIAGNOSIS — J069 Acute upper respiratory infection, unspecified: Secondary | ICD-10-CM

## 2023-01-12 DIAGNOSIS — H9203 Otalgia, bilateral: Secondary | ICD-10-CM | POA: Diagnosis not present

## 2023-01-12 LAB — POC COVID19/FLU A&B COMBO
Covid Antigen, POC: NEGATIVE
Influenza A Antigen, POC: NEGATIVE
Influenza B Antigen, POC: NEGATIVE

## 2023-01-12 LAB — POCT RAPID STREP A (OFFICE): Rapid Strep A Screen: NEGATIVE

## 2023-01-12 MED ORDER — PROMETHAZINE-DM 6.25-15 MG/5ML PO SYRP: 5.0000 mL | ORAL_SOLUTION | Freq: Three times a day (TID) | ORAL | 0 refills | Status: DC | PRN

## 2023-01-12 NOTE — ED Provider Notes (Signed)
[facility]    CSN: [id number] Arrival date & time: 01/12/23  1747      History   Chief Complaint Chief Complaint  Patient presents with   Cough   Nasal Congestion   Sore Throat    HPI Manuel Silva is a 37 y.o. male.   The history is provided by the patient. No language interpreter was used.  Cough Cough characteristics:  Non-productive Severity:  Moderate Duration:  3 days Timing:  Constant Progression:  Unchanged Chronicity:  New Smoker: no   Context: sick contacts   Context comment:  Children were recenlty sick. One of them had RSV Relieved by:  Nothing Worsened by:  Nothing Ineffective treatments: NyQuil, Honey. Associated symptoms: sore throat   Associated symptoms: no chest pain, no ear pain, no fever, no shortness of breath, no sinus congestion and no wheezing   Sore throat:    Severity:  Moderate   Onset quality:  Gradual   Duration:  3 days   Timing:  Constant   Progression:  Worsening (Worse with swallowing.) Sore Throat Pertinent negatives include no chest pain and no shortness of breath.    Past Medical History:  Diagnosis Date   Back pain    Gastritis     There are no problems to display for this patient.   History reviewed. No pertinent surgical history.     Home Medications    Prior to Admission medications   Medication Sig Start Date End Date Taking? Authorizing Provider  Oxycodone HCl 10 MG TABS Take 10 mg by mouth every 8 (eight) hours as needed. 01/01/23  Yes [provider]  benzonatate (TESSALON) 100 MG capsule Take 1 capsule (100 mg total) by mouth every 8 (eight) hours. 12/25/22   Carlisle Beers, FNP  Guaifenesin 1200 MG TB12 Take 1 tablet (1,200 mg total) by mouth in the morning and at bedtime. 12/25/22   Carlisle Beers, FNP  promethazine-dextromethorphan (PROMETHAZINE-DM) 6.25-15 MG/5ML syrup Take 5 mLs by mouth at bedtime as needed for cough. 12/25/22   Carlisle Beers, FNP     Family History Family History  Problem Relation Age of Onset   Healthy Mother    Healthy Father     Social History Social History   Tobacco Use   Smoking status: Some Days    Types: Cigarettes   Smokeless tobacco: Never  Vaping Use   Vaping status: Some Days  Substance Use Topics   Alcohol use: Not Currently    Comment: occasionally   Drug use: No     Allergies   Penicillins   Review of Systems Review of Systems  Constitutional: Negative.  Negative for fever.  HENT:  Positive for sore throat. Negative for ear pain.   Respiratory:  Positive for cough. Negative for shortness of breath and wheezing.   Cardiovascular:  Negative for chest pain.  All other systems reviewed and are negative.    Physical Exam Triage Vital Signs ED Triage Vitals  Encounter Vitals Group     BP 01/12/23 1824 111/74     Systolic BP Percentile --      Diastolic BP Percentile --      Pulse Rate 01/12/23 1824 60     Resp 01/12/23 1824 15     Temp 01/12/23 1824 98 F (36.7 C)     Temp Source 01/12/23 1824 Oral     SpO2 01/12/23 1824 97 %     Weight --      Height --  Head Circumference --      Peak Flow --      Pain Score 01/12/23 1823 6     Pain Loc --      Pain Education --      Exclude from Growth Chart --    No data found.  Updated Vital Signs BP 111/74 (BP Location: Left Arm)   Pulse 60   Temp 98 F (36.7 C) (Oral)   Resp 15   SpO2 97%   Visual Acuity Right Eye Distance:   Left Eye Distance:   Bilateral Distance:    Right Eye Near:   Left Eye Near:    Bilateral Near:     Physical Exam Vitals and nursing note reviewed.  Constitutional:      General: He is not in acute distress.    Appearance: He is obese. He is ill-appearing. He is not toxic-appearing.  HENT:     Right Ear: Ear canal normal. No drainage, swelling or tenderness. No middle ear effusion.     Left Ear: Ear canal normal. No drainage, swelling or tenderness.  No middle ear effusion.      Ears:     Comments: Cerumen impaction B/L    Mouth/Throat:     Pharynx: Posterior oropharyngeal erythema present. No oropharyngeal exudate.  Eyes:     General:        Right eye: No discharge.        Left eye: No discharge.     Extraocular Movements: Extraocular movements intact.     Conjunctiva/sclera: Conjunctivae normal.  Cardiovascular:     Rate and Rhythm: Normal rate and regular rhythm.     Heart sounds: Normal heart sounds. No murmur heard. Pulmonary:     Effort: Pulmonary effort is normal. No respiratory distress.     Breath sounds: Normal breath sounds. No wheezing or rhonchi.  Neurological:     Mental Status: He is alert.      UC Treatments / Results  Labs (all labs ordered are listed, but only abnormal results are displayed) Labs Reviewed  POCT RAPID STREP A (OFFICE)  POC COVID19/FLU A&B COMBO    EKG   Radiology No results found.  Procedures Procedures (including critical care time)  Medications Ordered in UC Medications - No data to display  Initial Impression / Assessment and Plan / UC Course  I have reviewed the triage vital signs and the nursing notes.  Pertinent labs & imaging results that were available during my care of the patient were reviewed by me and considered in my medical decision making (see chart for details).  Clinical Course as of 01/12/23 1850  Sat Jan 12, 2023  1847 He is moderately sick-appearing with no acute distress His strep test, covid, and flu tests are negative.  He might have had a simple viral infection.  These are typically self-limiting.  I sent in Promethazine DM prn cough and congestion Keep well hydrated with rest at home Return precautions discussed [KE]    Clinical Course User Index [KE] Doreene Eland, MD   Final Clinical Impressions(s) / UC Diagnoses   Final diagnoses:  Upper respiratory tract infection, unspecified type  Otalgia of both ears  Acute pharyngitis, unspecified etiology     Discharge  Instructions      It was nice seeing you. I am sorry you don't feel well. Your strep test, covid and flu test are negative.  You might have had a simple viral infection causing your symptoms. These are typically  self-limiting.  I sent in Promethazine DM prn cough and congestion Keep well hydrated and rest at home See [place] soon if there is no improvement     ED Prescriptions   None    PDMP not reviewed this encounter.   Doreene Eland, MD 01/12/23 [phone]

## 2023-01-12 NOTE — ED Triage Notes (Signed)
Pt c/o cough, congestion, fatigue, sore throat for about 3-4 days.

## 2023-01-12 NOTE — Discharge Instructions (Addendum)
It was nice seeing you. I am sorry you don't feel well. Your strep test, covid and flu test are negative.  You might have had a simple viral infection causing your symptoms. These are typically self-limiting.  I sent in Promethazine DM prn cough and congestion Keep well hydrated and rest at home See [place] soon if there is no improvement

## 2023-01-18 ENCOUNTER — Encounter (HOSPITAL_COMMUNITY): Payer: Self-pay

## 2023-01-18 ENCOUNTER — Ambulatory Visit (HOSPITAL_COMMUNITY)
Admission: EM | Admit: 2023-01-18 | Discharge: 2023-01-18 | Disposition: A | Discharge status: Home / Self Care | Payer: Medicaid Other

## 2023-01-18 DIAGNOSIS — J029 Acute pharyngitis, unspecified: Secondary | ICD-10-CM | POA: Diagnosis not present

## 2023-01-18 NOTE — Discharge Instructions (Addendum)
Return if any problems.  Follow up with primary care for recheck, Try Cepacol lozenges, Warm salt water gargles

## 2023-01-18 NOTE — ED Triage Notes (Signed)
Sore throat, runny nose, congestion, burning with inhalation, pain with swallowing, neck pain with turning the head. Symptoms ongoing since last time Patient was seen 01/12/23. States the symptoms got better for a little while and then got worse.   Patient taking prescription cough medication and tylenol with no relief.

## 2023-01-18 NOTE — ED Provider Notes (Signed)
[facility]    CSN: [id number] Arrival date & time: 01/18/23  1944      History   Chief Complaint Chief Complaint  Patient presents with   Sore Throat    HPI Manuel Silva is a 37 y.o. male.   Patient complains of a persistent sore throat.  Patient was seen here on 1026 for the same.  Patient had a negative flu COVID and strep.  Patient reports he is exposed to multiple illnesses at work.  Patient has been taking promethazine cough syrup.  He reports no relief.  The history is provided by the patient. No language interpreter was used.  Sore Throat This is a new problem. The current episode started more than 2 days ago. The problem occurs constantly. The problem has been gradually worsening. Nothing aggravates the symptoms. Nothing relieves the symptoms. He has tried nothing for the symptoms. The treatment provided no relief.    Past Medical History:  Diagnosis Date   Back pain    Gastritis     There are no problems to display for this patient.   History reviewed. No pertinent surgical history.     Home Medications    Prior to Admission medications   Medication Sig Start Date End Date Taking? Authorizing Provider  benzonatate (TESSALON) 100 MG capsule Take 1 capsule (100 mg total) by mouth every 8 (eight) hours. 12/25/22  Yes Carlisle Beers, FNP  promethazine-dextromethorphan (PROMETHAZINE-DM) 6.25-15 MG/5ML syrup Take 5 mLs by mouth every 8 (eight) hours as needed for cough. 01/12/23  Yes Doreene Eland, MD  Guaifenesin 1200 MG TB12 Take 1 tablet (1,200 mg total) by mouth in the morning and at bedtime. 12/25/22   Carlisle Beers, FNP  Oxycodone HCl 10 MG TABS Take 10 mg by mouth every 8 (eight) hours as needed. 01/01/23   [provider]    Family History Family History  Problem Relation Age of Onset   Healthy Mother    Healthy Father     Social History Social History   Tobacco Use   Smoking status: Some Days     Types: Cigarettes   Smokeless tobacco: Never  Vaping Use   Vaping status: Some Days  Substance Use Topics   Alcohol use: Not Currently    Comment: occasionally   Drug use: No     Allergies   Penicillins   Review of Systems Review of Systems  All other systems reviewed and are negative.    Physical Exam Triage Vital Signs ED Triage Vitals  Encounter Vitals Group     BP 01/18/23 1956 125/81     Systolic BP Percentile --      Diastolic BP Percentile --      Pulse Rate 01/18/23 1956 68     Resp 01/18/23 1956 18     Temp 01/18/23 1956 98 F (36.7 C)     Temp Source 01/18/23 1956 Oral     SpO2 01/18/23 1956 97 %     Weight --      Height --      Head Circumference --      Peak Flow --      Pain Score 01/18/23 1954 6     Pain Loc --      Pain Education --      Exclude from Growth Chart --    No data found.  Updated Vital Signs BP 125/81 (BP Location: Left Arm)   Pulse 68   Temp 98  F (36.7 C) (Oral)   Resp 18   SpO2 97%   Visual Acuity Right Eye Distance:   Left Eye Distance:   Bilateral Distance:    Right Eye Near:   Left Eye Near:    Bilateral Near:     Physical Exam Vitals and nursing note reviewed.  Constitutional:      Appearance: He is well-developed.  HENT:     Head: Normocephalic.     Mouth/Throat:     Mouth: Mucous membranes are moist. Mucous membranes are pale.  Eyes:     Conjunctiva/sclera: Conjunctivae normal.  Cardiovascular:     Rate and Rhythm: Normal rate.  Pulmonary:     Effort: Pulmonary effort is normal.  Abdominal:     General: There is no distension.  Musculoskeletal:        General: Normal range of motion.     Cervical back: Normal range of motion.  Skin:    General: Skin is warm.  Neurological:     General: No focal deficit present.     Mental Status: He is alert and oriented to person, place, and time.      UC Treatments / Results  Labs (all labs ordered are listed, but only abnormal results are  displayed) Labs Reviewed - No data to display  EKG   Radiology No results found.  Procedures Procedures (including critical care time)  Medications Ordered in UC Medications - No data to display  Initial Impression / Assessment and Plan / UC Course  I have reviewed the triage vital signs and the nursing notes.  Pertinent labs & imaging results that were available during my care of the patient were reviewed by me and considered in my medical decision making (see chart for details).     Patient's records reviewed.  I suspect this is a viral illness patient has normal vitals he is afebrile he does not have any tonsillar swelling.  I advised patient symptomatic care. Final Clinical Impressions(s) / UC Diagnoses   Final diagnoses:  Sore throat     Discharge Instructions      Return if any problems.  Follow up with primary care for recheck, Try Cepacol lozenges, Warm salt water gargles    ED Prescriptions   None    PDMP not reviewed this encounter. An After Visit Summary was printed and given to the patient.       Elson Areas, [place] 01/18/23 2006

## 2023-04-03 ENCOUNTER — Ambulatory Visit (HOSPITAL_COMMUNITY): Payer: Medicaid Other

## 2023-05-23 ENCOUNTER — Encounter (HOSPITAL_COMMUNITY): Payer: Self-pay

## 2023-05-23 ENCOUNTER — Ambulatory Visit (HOSPITAL_COMMUNITY)
Admission: EM | Admit: 2023-05-23 | Discharge: 2023-05-23 | Disposition: A | Discharge status: Home / Self Care | Payer: Self-pay | Attending: Emergency Medicine | Admitting: Emergency Medicine

## 2023-05-23 DIAGNOSIS — B9789 Other viral agents as the cause of diseases classified elsewhere: Secondary | ICD-10-CM

## 2023-05-23 DIAGNOSIS — J988 Other specified respiratory disorders: Secondary | ICD-10-CM

## 2023-05-23 DIAGNOSIS — R112 Nausea with vomiting, unspecified: Secondary | ICD-10-CM

## 2023-05-23 LAB — POC COVID19/FLU A&B COMBO
Covid Antigen, POC: NEGATIVE
Influenza A Antigen, POC: NEGATIVE
Influenza B Antigen, POC: NEGATIVE

## 2023-05-23 MED ORDER — ONDANSETRON 4 MG PO TBDP: 4.0000 mg | ORAL_TABLET | Freq: Three times a day (TID) | ORAL | 0 refills | Status: DC | PRN

## 2023-05-23 NOTE — Discharge Instructions (Signed)
 Flu and COVID testing were negative today. As discussed it is likely that you also have RSV due to recent exposure from son.   I have prescribed Zofran that you can take every 8 hours as needed for nausea vomiting. You can alternate between Tylenol and Ibuprofen as needed for pain and fever. I recommend Mucinex for cough and congestion as needed. Stay hydrated and get plenty of rest. Return here if symptoms persist or worsen.

## 2023-05-23 NOTE — ED Provider Notes (Addendum)
 [facility]    CSN: [id number] Arrival date & time: 05/23/23  0815      History   Chief Complaint Chief Complaint  Patient presents with   Cough   Nausea    HPI Manuel Silva is a 38 y.o. male.   Patient presents with nausea, vomiting, sore throat, abdominal pain, congestion, cough, and runny nose that began yesterday.  Denies known fever, shortness of breath, chest pain, and diarrhea.    Reports recent exposure to RSV from youngest son. Reports he has been taking NyQuil with some relief.   Cough   Past Medical History:  Diagnosis Date   Back pain    Gastritis     There are no active problems to display for this patient.   History reviewed. No pertinent surgical history.     Home Medications    Prior to Admission medications   Medication Sig Start Date End Date Taking? Authorizing Provider  ondansetron (ZOFRAN-ODT) 4 MG disintegrating tablet Take 1 tablet (4 mg total) by mouth every 8 (eight) hours as needed for nausea or vomiting. 05/23/23  Yes Letta Kocher, NP    Family History Family History  Problem Relation Age of Onset   Healthy Mother    Healthy Father     Social History Social History   Tobacco Use   Smoking status: Some Days    Types: Cigarettes   Smokeless tobacco: Never  Vaping Use   Vaping status: Some Days  Substance Use Topics   Alcohol use: Not Currently    Comment: occasionally   Drug use: No     Allergies   Penicillins   Review of Systems Review of Systems  Respiratory:  Positive for cough.    Per HPI  Physical Exam Triage Vital Signs ED Triage Vitals  Encounter Vitals Group     BP 05/23/23 0847 116/67     Systolic BP Percentile --      Diastolic BP Percentile --      Pulse Rate 05/23/23 0847 60     Resp 05/23/23 0847 16     Temp 05/23/23 0847 98 F (36.7 C)     Temp Source 05/23/23 0847 Oral     SpO2 05/23/23 0847 95 %     Weight 05/23/23 0847 205 lb (93 kg)     Height 05/23/23 0847  5\' 9"  (1.753 m)     Head Circumference --      Peak Flow --      Pain Score 05/23/23 0846 8     Pain Loc --      Pain Education --      Exclude from Growth Chart --    No data found.  Updated Vital Signs BP 116/67 (BP Location: Right Arm)   Pulse 60   Temp 98 F (36.7 C) (Oral)   Resp 16   Ht 5\' 9"  (1.753 m)   Wt 205 lb (93 kg)   SpO2 95%   BMI 30.27 kg/m   Visual Acuity Right Eye Distance:   Left Eye Distance:   Bilateral Distance:    Right Eye Near:   Left Eye Near:    Bilateral Near:     Physical Exam Vitals and nursing note reviewed.  Constitutional:      General: He is awake. He is not in acute distress.    Appearance: Normal appearance. He is well-developed and well-groomed. He is not ill-appearing.  HENT:     Right Ear: Tympanic membrane,  ear canal and external ear normal.     Left Ear: Tympanic membrane, ear canal and external ear normal.     Nose: Congestion and rhinorrhea present.     Mouth/Throat:     Mouth: Mucous membranes are moist.     Pharynx: Posterior oropharyngeal erythema present. No oropharyngeal exudate.  Cardiovascular:     Rate and Rhythm: Normal rate and regular rhythm.  Pulmonary:     Effort: Pulmonary effort is normal.     Breath sounds: Normal breath sounds.  Abdominal:     General: Abdomen is flat. Bowel sounds are normal.     Palpations: Abdomen is soft.     Tenderness: There is no abdominal tenderness. There is no guarding or rebound.  Musculoskeletal:        General: Normal range of motion.  Skin:    General: Skin is warm and dry.  Neurological:     Mental Status: He is alert.  Psychiatric:        Behavior: Behavior is cooperative.      UC Treatments / Results  Labs (all labs ordered are listed, but only abnormal results are displayed) Labs Reviewed  POC COVID19/FLU A&B COMBO    EKG   Radiology No results found.  Procedures Procedures (including critical care time)  Medications Ordered in UC Medications -  No data to display  Initial Impression / Assessment and Plan / UC Course  I have reviewed the triage vital signs and the nursing notes.  Pertinent labs & imaging results that were available during my care of the patient were reviewed by me and considered in my medical decision making (see chart for details).     Upon assessment congestion and rhinorrhea present, mild erythema noted to pharynx.  Lungs clear bilaterally to auscultation.  Nontender upon palpation noted  Flu and COVID testing negative.  Discussed that it is likely that patient has RSV due to recent exposure from son.  Prescribe Zofran as needed for nausea and vomiting.  Discussed symptomatic treatment.  Discussed return precautions. Final Clinical Impressions(s) / UC Diagnoses   Final diagnoses:  Viral respiratory illness  Nausea and vomiting, unspecified vomiting type     Discharge Instructions      Flu and COVID testing were negative today. As discussed it is likely that you also have RSV due to recent exposure from son.   I have prescribed Zofran that you can take every 8 hours as needed for nausea vomiting. You can alternate between Tylenol and Ibuprofen as needed for pain and fever. I recommend Mucinex for cough and congestion as needed. Stay hydrated and get plenty of rest. Return here if symptoms persist or worsen.       ED Prescriptions     Medication Sig Dispense Auth. Provider   ondansetron (ZOFRAN-ODT) 4 MG disintegrating tablet Take 1 tablet (4 mg total) by mouth every 8 (eight) hours as needed for nausea or vomiting. 10 tablet Wynonia Lawman A, NP      PDMP not reviewed this encounter.   Letta Kocher, NP 05/23/23 0925    Letta Kocher, NP 05/23/23 [phone]

## 2023-05-23 NOTE — ED Triage Notes (Signed)
 Chief Complaint: nausea, sore throat, abdominal pain, congestion, cough, and runny nose   Sick exposure: Yes- Son had RSV  Onset: yesterday   Prescriptions or OTC medications tried: Yes- Nyquil    with mild relief  New foods, medications, or products: No  Recent Travel: No

## 2023-05-29 ENCOUNTER — Encounter (HOSPITAL_COMMUNITY): Payer: Self-pay

## 2023-05-29 ENCOUNTER — Ambulatory Visit (HOSPITAL_COMMUNITY): Admission: EM | Admit: 2023-05-29 | Discharge: 2023-05-29 | Disposition: A | Discharge status: Home / Self Care

## 2023-05-29 VITALS — BP 146/88 | HR 85 | Temp 98.6°F | Resp 18

## 2023-05-29 DIAGNOSIS — J069 Acute upper respiratory infection, unspecified: Secondary | ICD-10-CM

## 2023-05-29 MED ORDER — BENZONATATE 200 MG PO CAPS: 200.0000 mg | ORAL_CAPSULE | Freq: Three times a day (TID) | ORAL | 0 refills | Status: DC | PRN

## 2023-05-29 MED ORDER — AZELASTINE HCL 0.1 % NA SOLN: 1.0000 | Freq: Two times a day (BID) | NASAL | 1 refills | Status: DC

## 2023-05-29 MED ORDER — IBUPROFEN 800 MG PO TABS: 800.0000 mg | ORAL_TABLET | Freq: Three times a day (TID) | ORAL | 0 refills | Status: DC

## 2023-05-29 NOTE — Discharge Instructions (Addendum)
 Viral URI with cough -Take prescribed azelastine nasal spray 2 times daily as needed for cough suppression and nasal congestion -Take prescribed benzonatate 200 mg capsule 3 times daily as needed for cough suppression, especially before bed at night. -Take prescribed ibuprofen 800 mg 3 times daily as needed for muscle aches, inflammation, and headache. -Work note provided to return to work on Monday without restriction. -Continue to monitor symptoms for any change in severity if there is any escalation of symptoms follow-up for further evaluation and management.

## 2023-05-29 NOTE — ED Provider Notes (Signed)
 [facility]  Note:  This document was prepared using Dragon voice recognition software and may include unintentional dictation errors.  MRN: [id number] DOB: 1985-11-25  Subjective:   Manuel Silva is a 38 y.o. male presenting for cough, headache, nasal congestion, body aches, fatigue x 5 days.  Taking Tylenol and NyQuil with no relief.  Patient states that his younger son has RSV at home.  Patient needs work note to have a few days off for rest before returning on Monday.  No shortness of breath, chest pain, weakness, dizziness.  No current facility-administered medications for this encounter.  Current Outpatient Medications:    azelastine (ASTELIN) 0.1 % nasal spray, Place 1 spray into both nostrils 2 (two) times daily. Use in each nostril as directed, Disp: 30 mL, Rfl: 1   benzonatate (TESSALON) 200 MG capsule, Take 1 capsule (200 mg total) by mouth 3 (three) times daily as needed for cough., Disp: 20 capsule, Rfl: 0   ibuprofen (ADVIL) 800 MG tablet, Take 1 tablet (800 mg total) by mouth 3 (three) times daily., Disp: 21 tablet, Rfl: 0   ondansetron (ZOFRAN-ODT) 4 MG disintegrating tablet, Take 1 tablet (4 mg total) by mouth every 8 (eight) hours as needed for nausea or vomiting., Disp: 10 tablet, Rfl: 0   Allergies  Allergen Reactions   Penicillins Other (See Comments)    Unknown- was told he was allergic to PCN Has patient had a PCN reaction causing immediate rash, facial/tongue/throat swelling, SOB or lightheadedness with hypotension: unk Has patient had a PCN reaction causing severe rash involving mucus membranes or skin necrosis: unk Has patient had a PCN reaction that required hospitalization: unk Has patient had a PCN reaction occurring within the last 10 years: unk If all of the above answers are "NO", then may proceed with Cephalosporin use.      Past Medical History:  Diagnosis Date   Back pain    Gastritis      History reviewed. No pertinent  surgical history.  Family History  Problem Relation Age of Onset   Healthy Mother    Healthy Father     Social History   Tobacco Use   Smoking status: Some Days    Types: Cigarettes   Smokeless tobacco: Never  Vaping Use   Vaping status: Some Days  Substance Use Topics   Alcohol use: Not Currently    Comment: occasionally   Drug use: No    ROS Refer to HPI for ROS details.  Objective:   Vitals: BP (!) 146/88 (BP Location: Right Arm)   Pulse 85   Temp 98.6 F (37 C) (Oral)   Resp 18   SpO2 97%   Physical Exam Vitals and nursing note reviewed.  Constitutional:      General: He is not in acute distress.    Appearance: Normal appearance. He is well-developed and normal weight. He is ill-appearing. He is not toxic-appearing.  HENT:     Head: Normocephalic.     Nose: Congestion and rhinorrhea present.     Mouth/Throat:     Mouth: Mucous membranes are moist.     Pharynx: Oropharynx is clear.  Eyes:     Extraocular Movements: Extraocular movements intact.     Conjunctiva/sclera: Conjunctivae normal.  Cardiovascular:     Rate and Rhythm: Normal rate.  Pulmonary:     Effort: Pulmonary effort is normal. No respiratory distress.  Skin:    General: Skin is warm and dry.  Neurological:  General: No focal deficit present.     Mental Status: He is alert and oriented to person, place, and time.  Psychiatric:        Mood and Affect: Mood normal.     Procedures  No results found for this or any previous visit (from the past 24 hours).  Assessment and Plan :   PDMP not reviewed this encounter.  1. Viral URI with cough    Viral URI with cough -Take prescribed azelastine nasal spray 2 times daily as needed for cough suppression and nasal congestion -Take prescribed benzonatate 200 mg capsule 3 times daily as needed for cough suppression, especially before bed at night. -Take prescribed ibuprofen 800 mg 3 times daily as needed for muscle aches, inflammation,  and headache. -Work note provided to return to work on Monday without restriction. -Continue to monitor symptoms for any change in severity if there is any escalation of symptoms follow-up for further evaluation and management.  Manuel Silva   Baker, [place], [place] 05/29/23 1456

## 2023-05-29 NOTE — ED Triage Notes (Signed)
 Pt c/o cough, runny nose, and headache x5 days. States taking tylenol and nyquil with no relief. States his son has RSV.

## 2023-06-04 ENCOUNTER — Encounter (HOSPITAL_COMMUNITY): Payer: Self-pay

## 2023-06-04 ENCOUNTER — Ambulatory Visit (HOSPITAL_COMMUNITY)
Admission: EM | Admit: 2023-06-04 | Discharge: 2023-06-04 | Disposition: A | Discharge status: Home / Self Care | Attending: Internal Medicine | Admitting: Internal Medicine

## 2023-06-04 VITALS — BP 123/75 | HR 59 | Temp 97.9°F | Resp 16

## 2023-06-04 DIAGNOSIS — J40 Bronchitis, not specified as acute or chronic: Secondary | ICD-10-CM

## 2023-06-04 DIAGNOSIS — J209 Acute bronchitis, unspecified: Secondary | ICD-10-CM

## 2023-06-04 MED ORDER — ONDANSETRON 4 MG PO TBDP
ORAL_TABLET | ORAL | Status: AC
  Filled 2023-06-04: qty 1

## 2023-06-04 MED ORDER — IPRATROPIUM-ALBUTEROL 0.5-2.5 (3) MG/3ML IN SOLN
3.0000 mL | Freq: Once | RESPIRATORY_TRACT | Status: AC
  Administered 2023-06-04: 3 mL via RESPIRATORY_TRACT

## 2023-06-04 MED ORDER — IPRATROPIUM-ALBUTEROL 0.5-2.5 (3) MG/3ML IN SOLN
RESPIRATORY_TRACT | Status: AC
  Filled 2023-06-04: qty 3

## 2023-06-04 MED ORDER — DOXYCYCLINE HYCLATE 100 MG PO CAPS: 100.0000 mg | ORAL_CAPSULE | Freq: Two times a day (BID) | ORAL | 0 refills | Status: DC

## 2023-06-04 MED ORDER — ONDANSETRON 4 MG PO TBDP: 4.0000 mg | ORAL_TABLET | Freq: Once | ORAL | Status: DC

## 2023-06-04 MED ORDER — PREDNISONE 20 MG PO TABS: 20.0000 mg | ORAL_TABLET | Freq: Every day | ORAL | 0 refills | Status: DC

## 2023-06-04 MED ORDER — ALBUTEROL SULFATE HFA 108 (90 BASE) MCG/ACT IN AERS: 2.0000 | INHALATION_SPRAY | RESPIRATORY_TRACT | 0 refills | Status: DC | PRN

## 2023-06-04 NOTE — ED Provider Notes (Addendum)
 [facility]    CSN: [id number] Arrival date & time: 06/04/23  1701      History   Chief Complaint Chief Complaint  Patient presents with   Emesis   Cough   Nasal Congestion    HPI Manuel Silva is a 38 y.o. male presents due to still having a cough, nose congestion x 11 days.  He was seen 3/12 and had been sick for 5 days. So he is on his 11th day now. He started vomiting after hard coughing  since yesterday. He ate old doughnuts and developed diarrhea x 2. None today.  He has been wheezing. Has been feeling hot and cold the past few days. He admits he is a smoker.    Past Medical History:  Diagnosis Date   Back pain    Gastritis     There are no active problems to display for this patient.   History reviewed. No pertinent surgical history.   Home Medications    Prior to Admission medications   Medication Sig Start Date End Date Taking? Authorizing Provider  albuterol (VENTOLIN HFA) 108 (90 Base) MCG/ACT inhaler Inhale 2 puffs into the lungs every 4 (four) hours as needed for wheezing or shortness of breath. 06/04/23  Yes Rodriguez-Southworth, Nettie Elm, PA-C  doxycycline (VIBRAMYCIN) 100 MG capsule Take 1 capsule (100 mg total) by mouth 2 (two) times daily. 06/04/23  Yes Rodriguez-Southworth, Nettie Elm, PA-C  predniSONE (DELTASONE) 20 MG tablet Take 1 tablet (20 mg total) by mouth daily with breakfast. 06/04/23  Yes Rodriguez-Southworth, Nettie Elm, PA-C  ondansetron (ZOFRAN-ODT) 4 MG disintegrating tablet Take 1 tablet (4 mg total) by mouth every 8 (eight) hours as needed for nausea or vomiting. 05/23/23   Letta Kocher, NP    Family History Family History  Problem Relation Age of Onset   Healthy Mother    Healthy Father     Social History Social History   Tobacco Use   Smoking status: Some Days    Types: Cigarettes   Smokeless tobacco: Never  Vaping Use   Vaping status: Some Days  Substance Use Topics   Alcohol use: Not Currently    Comment:  occasionally   Drug use: No     Allergies   Penicillins   Review of Systems Review of Systems As noted in HPI  Physical Exam Triage Vital Signs ED Triage Vitals  Encounter Vitals Group     BP 06/04/23 1733 123/75     Systolic BP Percentile --      Diastolic BP Percentile --      Pulse Rate 06/04/23 1733 (!) 59     Resp 06/04/23 1733 16     Temp 06/04/23 1733 97.9 F (36.6 C)     Temp Source 06/04/23 1733 Oral     SpO2 06/04/23 1733 96 %     Weight --      Height --      Head Circumference --      Peak Flow --      Pain Score 06/04/23 1735 0     Pain Loc --      Pain Education --      Exclude from Growth Chart --    No data found.  Updated Vital Signs BP 123/75 (BP Location: Right Arm)   Pulse (!) 59   Temp 97.9 F (36.6 C) (Oral)   Resp 16   SpO2 96%   Visual Acuity Right Eye Distance:   Left Eye Distance:  Bilateral Distance:    Right Eye Near:   Left Eye Near:    Bilateral Near:     Physical Exam Physical Exam Constitutional:      General: He is not in acute distress.    Appearance: He is not toxic-appearing.  HENT:     Head: Normocephalic.     Right Ear: Tympanic membrane, ear canal and external ear normal.     Left Ear: Ear canal and external ear normal.     Nose: Nose normal.     Mouth/Throat:     Mouth: Mucous membranes are moist.     Pharynx: Oropharynx is clear.  Eyes:     General: No scleral icterus.    Conjunctiva/sclera: Conjunctivae normal.  Cardiovascular:     Rate and Rhythm: Normal rate and regular rhythm.     Heart sounds: No murmur heard.   Pulmonary:     Effort: Pulmonary effort is normal. No respiratory distress.     Breath sounds: Wheezing present.     Comments: Has auditory wheezing Musculoskeletal:        General: Normal range of motion.     Cervical back: Neck supple.  Lymphadenopathy:     Cervical: No cervical adenopathy.  Skin:    General: Skin is warm and dry.     Findings: No rash.  Neurological:      Mental Status: He is alert and oriented to person, place, and time.     Gait: Gait normal.  Psychiatric:        Mood and Affect: Mood normal.        Behavior: Behavior normal.        Thought Content: Thought content normal.        Judgment: Judgment normal.    UC Treatments / Results  Labs (all labs ordered are listed, but only abnormal results are displayed) Labs Reviewed - No data to display  EKG   Radiology No results found.  Procedures Procedures (including critical care time)  Medications Ordered in UC Medications  ipratropium-albuterol (DUONEB) 0.5-2.5 (3) MG/3ML nebulizer solution 3 mL (3 mLs Nebulization Given 06/04/23 1804)    Initial Impression / Assessment and Plan / UC Course  I have reviewed the triage vital signs and the nursing notes. He was given a Duoneb treatment  and helped the wheezing a little. His pulse ox improved to 97%  Acute bronchitis  He was placed on Albuterol inhaler, Doxy and prednisone as noted.  Advised to d/c smoking      Final Clinical Impressions(s) / UC Diagnoses   Final diagnoses:  Bronchitis     Discharge Instructions      Work on stop smoking     ED Prescriptions     Medication Sig Dispense Auth. Provider   albuterol (VENTOLIN HFA) 108 (90 Base) MCG/ACT inhaler Inhale 2 puffs into the lungs every 4 (four) hours as needed for wheezing or shortness of breath. 18 g Rodriguez-Southworth, Nettie Elm, PA-C   doxycycline (VIBRAMYCIN) 100 MG capsule Take 1 capsule (100 mg total) by mouth 2 (two) times daily. 20 capsule Rodriguez-Southworth, Nettie Elm, PA-C   predniSONE (DELTASONE) 20 MG tablet Take 1 tablet (20 mg total) by mouth daily with breakfast. 5 tablet Rodriguez-Southworth, Nettie Elm, PA-C      PDMP not reviewed this encounter.   Garey Ham, PA-C 06/04/23 1843    Garey Ham, PA-C 06/04/23 1904

## 2023-06-04 NOTE — ED Triage Notes (Signed)
 Patient was seen on 05/29/23. Patient continues to c/o a cough, nasal co congestion, and emesis.  Patient states he has been taking nyquil and lots of fluids.

## 2023-06-04 NOTE — Discharge Instructions (Signed)
Work on stop smoking

## 2023-06-06 ENCOUNTER — Encounter (HOSPITAL_COMMUNITY): Payer: Self-pay

## 2023-06-06 ENCOUNTER — Ambulatory Visit (HOSPITAL_COMMUNITY): Admission: EM | Admit: 2023-06-06 | Discharge: 2023-06-06 | Disposition: A | Discharge status: Home / Self Care

## 2023-06-06 DIAGNOSIS — Z0289 Encounter for other administrative examinations: Secondary | ICD-10-CM | POA: Diagnosis not present

## 2023-06-06 DIAGNOSIS — J069 Acute upper respiratory infection, unspecified: Secondary | ICD-10-CM | POA: Diagnosis not present

## 2023-06-06 NOTE — ED Triage Notes (Signed)
 Patient here today with c/o cough, nasal congestion, headache, and a little wheeze X 1 week. His youngest son had RSV. Denies fever. He has been taking Nyquil with some relief. Patient was here 2 days ago but has not been able to pick up the medications prescribed.

## 2023-06-06 NOTE — Discharge Instructions (Addendum)
 Please pick up the medicine as prescribed to you earlier this week and take as prescribed

## 2023-06-06 NOTE — ED Provider Notes (Signed)
 [facility]    CSN: [id number] Arrival date & time: 06/06/23  1929      History   Chief Complaint Chief Complaint  Patient presents with   Nasal Congestion    HPI ROJELIO UHRICH is a 38 y.o. male.   Patient presents today with ongoing nasal congestion and cough.  Reports he has not picked up his medicine from his visit on 06/04/2023 and reports he would not be able to pick it up until tomorrow morning.  He is requesting a work note today.  No change in appetite or change in behavior.  No chest pain or shortness of breath.  He is still wheezing.    Past Medical History:  Diagnosis Date   Back pain    Gastritis     There are no active problems to display for this patient.   History reviewed. No pertinent surgical history.     Home Medications    Prior to Admission medications   Medication Sig Start Date End Date Taking? Authorizing Provider  albuterol (VENTOLIN HFA) 108 (90 Base) MCG/ACT inhaler Inhale 2 puffs into the lungs every 4 (four) hours as needed for wheezing or shortness of breath. 06/04/23   Rodriguez-Southworth, Nettie Elm, PA-C  doxycycline (VIBRAMYCIN) 100 MG capsule Take 1 capsule (100 mg total) by mouth 2 (two) times daily. 06/04/23   Rodriguez-Southworth, Nettie Elm, PA-C  ondansetron (ZOFRAN-ODT) 4 MG disintegrating tablet Take 1 tablet (4 mg total) by mouth every 8 (eight) hours as needed for nausea or vomiting. 05/23/23   Wynonia Lawman A, NP  predniSONE (DELTASONE) 20 MG tablet Take 1 tablet (20 mg total) by mouth daily with breakfast. 06/04/23   Rodriguez-Southworth, Nettie Elm, PA-C    Family History Family History  Problem Relation Age of Onset   Healthy Mother    Healthy Father     Social History Social History   Tobacco Use   Smoking status: Some Days    Types: Cigarettes   Smokeless tobacco: Never  Vaping Use   Vaping status: Some Days  Substance Use Topics   Alcohol use: Not Currently    Comment: occasionally   Drug use:  No     Allergies   Penicillins   Review of Systems Review of Systems Per HPI  Physical Exam Triage Vital Signs ED Triage Vitals  Encounter Vitals Group     BP 06/06/23 1945 116/81     Systolic BP Percentile --      Diastolic BP Percentile --      Pulse Rate 06/06/23 1945 (!) 53     Resp 06/06/23 1945 16     Temp 06/06/23 1945 98 F (36.7 C)     Temp Source 06/06/23 1945 Oral     SpO2 06/06/23 1945 97 %     Weight --      Height --      Head Circumference --      Peak Flow --      Pain Score 06/06/23 1946 7     Pain Loc --      Pain Education --      Exclude from Growth Chart --    No data found.  Updated Vital Signs BP 116/81 (BP Location: Left Arm)   Pulse (!) 53   Temp 98 F (36.7 C) (Oral)   Resp 16   SpO2 97%   Visual Acuity Right Eye Distance:   Left Eye Distance:   Bilateral Distance:    Right Eye Near:  Left Eye Near:    Bilateral Near:     Physical Exam Vitals and nursing note reviewed.  Constitutional:      General: He is not in acute distress.    Appearance: Normal appearance. He is not ill-appearing or toxic-appearing.  HENT:     Head: Normocephalic and atraumatic.     Right Ear: External ear normal.     Left Ear: External ear normal.     Nose: Congestion present. No rhinorrhea.     Mouth/Throat:     Mouth: Mucous membranes are moist.     Pharynx: Oropharynx is clear.  Cardiovascular:     Rate and Rhythm: Normal rate and regular rhythm.  Pulmonary:     Effort: Pulmonary effort is normal. No respiratory distress.     Breath sounds: Wheezing present. No rhonchi.  Musculoskeletal:     Cervical back: Normal range of motion and neck supple.  Lymphadenopathy:     Cervical: No cervical adenopathy.  Skin:    General: Skin is warm and dry.     Coloration: Skin is not jaundiced or pale.     Findings: No erythema or rash.  Neurological:     Mental Status: He is alert and oriented to person, place, and time.  Psychiatric:         Behavior: Behavior is cooperative.      UC Treatments / Results  Labs (all labs ordered are listed, but only abnormal results are displayed) Labs Reviewed - No data to display  EKG   Radiology No results found.  Procedures Procedures (including critical care time)  Medications Ordered in UC Medications - No data to display  Initial Impression / Assessment and Plan / UC Course  I have reviewed the triage vital signs and the nursing notes.  Pertinent labs & imaging results that were available during my care of the patient were reviewed by me and considered in my medical decision making (see chart for details).   Patient is well-appearing, normotensive, afebrile, not tachycardic, not tachypneic, oxygenating well on room air.    1. URI, acute 2. Encounter to obtain excuse from work Discussed with patient he needs to pick up the medications that were previously prescribed to him and start them I offered to send him to 24-hour pharmacy, patient declines I offered injection of steroid medication, patient declines  patient request Note for work; given today to return tomorrow  The patient was given the opportunity to ask questions.  All questions answered to their satisfaction.  The patient is in agreement to this plan.   Final Clinical Impressions(s) / UC Diagnoses   Final diagnoses:  URI, acute  Encounter to obtain excuse from work     Discharge Instructions      Please pick up the medicine as prescribed to you earlier this week and take as prescribed   ED Prescriptions   None    PDMP not reviewed this encounter.   Valentino Nose, NP 06/06/23 2004

## 2023-07-03 ENCOUNTER — Ambulatory Visit (HOSPITAL_COMMUNITY)
Admission: RE | Admit: 2023-07-03 | Discharge: 2023-07-03 | Disposition: A | Discharge status: Home / Self Care | Source: Ambulatory Visit | Attending: Emergency Medicine | Admitting: Emergency Medicine

## 2023-07-03 ENCOUNTER — Encounter (HOSPITAL_COMMUNITY): Payer: Self-pay

## 2023-07-03 VITALS — BP 127/85 | HR 97 | Temp 97.6°F | Resp 18

## 2023-07-03 DIAGNOSIS — J301 Allergic rhinitis due to pollen: Secondary | ICD-10-CM | POA: Diagnosis not present

## 2023-07-03 DIAGNOSIS — R519 Headache, unspecified: Secondary | ICD-10-CM | POA: Diagnosis not present

## 2023-07-03 MED ORDER — CETIRIZINE HCL 10 MG PO TABS: 10.0000 mg | ORAL_TABLET | Freq: Every day | ORAL | 0 refills | Status: DC

## 2023-07-03 MED ORDER — PREDNISONE 20 MG PO TABS
40.0000 mg | ORAL_TABLET | Freq: Every day | ORAL | 0 refills | Status: AC
Start: 2023-07-03 — End: 2023-07-06

## 2023-07-03 MED ORDER — AZELASTINE HCL 0.1 % NA SOLN: 2.0000 | Freq: Two times a day (BID) | NASAL | 0 refills | Status: DC

## 2023-07-03 NOTE — ED Provider Notes (Signed)
 [facility]    CSN: [id number] Arrival date & time: 07/03/23  1258      History   Chief Complaint Chief Complaint  Patient presents with   Facial Pain    HPI Manuel Silva is a 38 y.o. male.   Patient presents with congestion, sinus pressure, and headache that began yesterday.  Patient states that he thinks he may have a sinus infection.  Patient states that he gets similar symptoms to this around this time every year.  Denies fever, sore throat, cough, body aches, chills, shortness of breath, and chest pain.  Patient reports taking Tylenol with minimal relief.  The history is provided by the patient and medical records.    Past Medical History:  Diagnosis Date   Back pain    Gastritis     There are no active problems to display for this patient.   History reviewed. No pertinent surgical history.     Home Medications    Prior to Admission medications   Medication Sig Start Date End Date Taking? Authorizing Provider  azelastine (ASTELIN) 0.1 % nasal spray Place 2 sprays into both nostrils 2 (two) times daily. Use in each nostril as directed 07/03/23  Yes Wynonia Lawman A, NP  cetirizine (ZYRTEC ALLERGY) 10 MG tablet Take 1 tablet (10 mg total) by mouth daily. 07/03/23  Yes Susann Givens, Marnell Mcdaniel A, NP  predniSONE (DELTASONE) 20 MG tablet Take 2 tablets (40 mg total) by mouth daily for 3 days. 07/03/23 07/06/23 Yes Susann Givens, Alera Quevedo A, NP  albuterol (VENTOLIN HFA) 108 (90 Base) MCG/ACT inhaler Inhale 2 puffs into the lungs every 4 (four) hours as needed for wheezing or shortness of breath. 06/04/23   Rodriguez-Southworth, Nettie Elm, PA-C  ondansetron (ZOFRAN-ODT) 4 MG disintegrating tablet Take 1 tablet (4 mg total) by mouth every 8 (eight) hours as needed for nausea or vomiting. 05/23/23   Letta Kocher, NP    Family History Family History  Problem Relation Age of Onset   Healthy Mother    Healthy Father     Social History Social History    Tobacco Use   Smoking status: Some Days    Types: Cigarettes   Smokeless tobacco: Never  Vaping Use   Vaping status: Some Days  Substance Use Topics   Alcohol use: Not Currently    Comment: occasionally   Drug use: No     Allergies   Penicillins   Review of Systems Review of Systems  Per HPI  Physical Exam Triage Vital Signs ED Triage Vitals  Encounter Vitals Group     BP 07/03/23 1319 127/85     Systolic BP Percentile --      Diastolic BP Percentile --      Pulse Rate 07/03/23 1319 97     Resp 07/03/23 1319 18     Temp 07/03/23 1319 97.6 F (36.4 C)     Temp Source 07/03/23 1319 Oral     SpO2 07/03/23 1319 96 %     Weight --      Height --      Head Circumference --      Peak Flow --      Pain Score 07/03/23 1320 7     Pain Loc --      Pain Education --      Exclude from Growth Chart --    No data found.  Updated Vital Signs BP 127/85 (BP Location: Right Arm)   Pulse 97   Temp 97.6 F (  36.4 C) (Oral)   Resp 18   SpO2 96%   Visual Acuity Right Eye Distance:   Left Eye Distance:   Bilateral Distance:    Right Eye Near:   Left Eye Near:    Bilateral Near:     Physical Exam Vitals and nursing note reviewed.  Constitutional:      General: He is awake. He is not in acute distress.    Appearance: Normal appearance. He is well-developed and well-groomed. He is not ill-appearing.  HENT:     Right Ear: Tympanic membrane, ear canal and external ear normal.     Left Ear: Tympanic membrane, ear canal and external ear normal.     Nose: Congestion and rhinorrhea present.     Right Sinus: Maxillary sinus tenderness present.     Left Sinus: Maxillary sinus tenderness present.     Mouth/Throat:     Mouth: Mucous membranes are moist.     Pharynx: Oropharynx is clear.  Cardiovascular:     Rate and Rhythm: Normal rate and regular rhythm.  Pulmonary:     Effort: Pulmonary effort is normal.     Breath sounds: Normal breath sounds.  Skin:    General:  Skin is warm and dry.  Neurological:     Mental Status: He is alert.  Psychiatric:        Behavior: Behavior is cooperative.      UC Treatments / Results  Labs (all labs ordered are listed, but only abnormal results are displayed) Labs Reviewed - No data to display  EKG   Radiology No results found.  Procedures Procedures (including critical care time)  Medications Ordered in UC Medications - No data to display  Initial Impression / Assessment and Plan / UC Course  I have reviewed the triage vital signs and the nursing notes.  Pertinent labs & imaging results that were available during my care of the patient were reviewed by me and considered in my medical decision making (see chart for details).     Patient is well-appearing.  Vitals are stable.  Congestion and rhinorrhea are present, bilateral maxillary sinus tenderness is present.  Prescribed short course of prednisone to assist with sinus pressure and headache.  Prescribed azelastine nasal spray to assist with congestion.  Prescribed cetirizine to take daily to help with symptoms.  Discussed return precautions. Final Clinical Impressions(s) / UC Diagnoses   Final diagnoses:  Sinus headache  Seasonal allergic rhinitis due to pollen     Discharge Instructions      Start taking prednisone once daily for 3 days to assist with sinus pressure.  Use azelastine nasal spray twice daily. Take cetirizine once daily. Return here if symptoms persist or worsen.   ED Prescriptions     Medication Sig Dispense Auth. Provider   azelastine (ASTELIN) 0.1 % nasal spray Place 2 sprays into both nostrils 2 (two) times daily. Use in each nostril as directed 30 mL Wynonia Lawman A, NP   predniSONE (DELTASONE) 20 MG tablet Take 2 tablets (40 mg total) by mouth daily for 3 days. 6 tablet Wynonia Lawman A, NP   cetirizine (ZYRTEC ALLERGY) 10 MG tablet Take 1 tablet (10 mg total) by mouth daily. 30 tablet Wynonia Lawman A, NP       PDMP not reviewed this encounter.   Wynonia Lawman A, NP 07/03/23 1401

## 2023-07-03 NOTE — Discharge Instructions (Signed)
 Start taking prednisone once daily for 3 days to assist with sinus pressure.  Use azelastine nasal spray twice daily. Take cetirizine once daily. Return here if symptoms persist or worsen.

## 2023-07-03 NOTE — ED Triage Notes (Signed)
 Pt states he thinks he has a sinus infection. Patient states symptoms started yesterday. He is having a lot of pain in his sinuses.

## 2023-08-24 ENCOUNTER — Encounter (HOSPITAL_COMMUNITY): Payer: Self-pay

## 2023-08-24 ENCOUNTER — Ambulatory Visit (HOSPITAL_COMMUNITY)
Admission: RE | Admit: 2023-08-24 | Discharge: 2023-08-24 | Disposition: A | Discharge status: Home / Self Care | Source: Ambulatory Visit | Attending: Nurse Practitioner | Admitting: Nurse Practitioner

## 2023-08-24 VITALS — BP 119/80 | HR 65 | Temp 98.3°F | Resp 16

## 2023-08-24 DIAGNOSIS — R109 Unspecified abdominal pain: Secondary | ICD-10-CM

## 2023-08-24 DIAGNOSIS — J018 Other acute sinusitis: Secondary | ICD-10-CM | POA: Diagnosis not present

## 2023-08-24 MED ORDER — CLARITHROMYCIN 500 MG PO TABS: 500.0000 mg | ORAL_TABLET | Freq: Two times a day (BID) | ORAL | 0 refills | Status: DC

## 2023-08-24 MED ORDER — FLUTICASONE PROPIONATE 50 MCG/ACT NA SUSP
2.0000 | Freq: Every day | NASAL | 0 refills | Status: DC
Start: 2023-08-24 — End: 2023-09-07

## 2023-08-24 NOTE — ED Provider Notes (Signed)
 [facility]    CSN: [id number] Arrival date & time: 08/24/23  1338      History   Chief Complaint Chief Complaint  Patient presents with   Nasal Congestion   Emesis    HPI Manuel Silva is a 38 y.o. male.   HPI  He is in today for evaluation of 2 days of sinus pressure.  He endorses that he is having right-sided nasal congestion.  He endorses that he feels like he does have a migraine.  He endorses that he does does suffer from seasonal allergies..  He denies any known sick exposures.  He does work often for [employer].  He endorses that following yesterday he ate from comments that his fluid they both developed abdominal pain dizziness headache episodes since.  He feels like this may be the an isolated incident from sinus pressure.  He denies fever, chills, dizziness, shortness of breath, chest pain Past Medical History:  Diagnosis Date   Back pain    Gastritis     There are no active problems to display for this patient.   History reviewed. No pertinent surgical history.     Home Medications    Prior to Admission medications   Medication Sig Start Date End Date Taking? Authorizing Provider  clarithromycin (BIAXIN) 500 MG tablet Take 1 tablet (500 mg total) by mouth 2 (two) times daily. 08/24/23  Yes Gregoria Leas, NP  fluticasone  (FLONASE ) 50 MCG/ACT nasal spray Place 2 sprays into both nostrils daily for 15 days. 08/24/23 09/08/23 Yes Gregoria Leas, NP  albuterol  (VENTOLIN  HFA) 108 (90 Base) MCG/ACT inhaler Inhale 2 puffs into the lungs every 4 (four) hours as needed for wheezing or shortness of breath. 06/04/23   Rodriguez-Southworth, Sylvia, PA-C  azelastine  (ASTELIN ) 0.1 % nasal spray Place 2 sprays into both nostrils 2 (two) times daily. Use in each nostril as directed 07/03/23   Levora Reas A, NP  cetirizine  (ZYRTEC  ALLERGY) 10 MG tablet Take 1 tablet (10 mg total) by mouth daily. 07/03/23   Levora Reas A, NP  ondansetron   (ZOFRAN -ODT) 4 MG disintegrating tablet Take 1 tablet (4 mg total) by mouth every 8 (eight) hours as needed for nausea or vomiting. 05/23/23   Karon Packer, NP    Family History Family History  Problem Relation Age of Onset   Healthy Mother    Healthy Father     Social History Social History   Tobacco Use   Smoking status: Some Days    Types: Cigarettes   Smokeless tobacco: Never  Vaping Use   Vaping status: Some Days  Substance Use Topics   Alcohol use: Not Currently    Comment: occasionally   Drug use: No     Allergies   Penicillins   Review of Systems Review of Systems   Physical Exam Triage Vital Signs ED Triage Vitals  Encounter Vitals Group     BP 08/24/23 1440 119/80     Systolic BP Percentile --      Diastolic BP Percentile --      Pulse Rate 08/24/23 1440 65     Resp 08/24/23 1440 16     Temp 08/24/23 1440 98.3 F (36.8 C)     Temp Source 08/24/23 1440 Oral     SpO2 08/24/23 1440 94 %     Weight --      Height --      Head Circumference --      Peak Flow --  Pain Score 08/24/23 1441 5     Pain Loc --      Pain Education --      Exclude from Growth Chart --    No data found.  Updated Vital Signs BP 119/80 (BP Location: Right Arm)   Pulse 65   Temp 98.3 F (36.8 C) (Oral)   Resp 16   SpO2 94%   Visual Acuity Right Eye Distance:   Left Eye Distance:   Bilateral Distance:    Right Eye Near:   Left Eye Near:    Bilateral Near:     Physical Exam Constitutional:      General: He is not in acute distress.    Appearance: He is normal weight.  HENT:     Head: Normocephalic and atraumatic.     Right Ear: There is impacted cerumen.     Left Ear: There is impacted cerumen.     Nose: No congestion or rhinorrhea.     Mouth/Throat:     Pharynx: Posterior oropharyngeal erythema present. No oropharyngeal exudate.  Cardiovascular:     Rate and Rhythm: Normal rate and regular rhythm.     Pulses: Normal pulses.     Heart sounds:  Normal heart sounds.  Pulmonary:     Effort: Pulmonary effort is normal.     Breath sounds: Normal breath sounds.  Musculoskeletal:        General: Normal range of motion.     Cervical back: Normal range of motion.  Skin:    General: Skin is warm and dry.     Capillary Refill: Capillary refill takes less than 2 seconds.  Neurological:     General: No focal deficit present.     Mental Status: He is alert and oriented to person, place, and time.  Psychiatric:        Mood and Affect: Mood normal.      UC Treatments / Results  Labs (all labs ordered are listed, but only abnormal results are displayed) Labs Reviewed - No data to display  EKG   Radiology No results found.  Procedures Procedures (including critical care time)  Medications Ordered in UC Medications - No data to display  Initial Impression / Assessment and Plan / UC Course  I have reviewed the triage vital signs and the nursing notes.  Pertinent labs & imaging results that were available during my care of the patient were reviewed by me and considered in my medical decision making (see chart for details).     Nasal congestion Final Clinical Impressions(s) / UC Diagnoses   Final diagnoses:  Acute non-recurrent sinusitis of other sinus  Abdominal pain, unspecified abdominal location     Discharge Instructions      You have been diagnosed with sinusitis. We encourage conservative treatment with symptom relief.  You are encouraged to use Mucinex  D 1 tablet twice daily for at least 7 days up to 10.  You have been that prescribed fluticasone  2 sprays each nostril daily for at least 15 days.    You have been prescribed a wait-and-see antibiotic of clarithromycin 500 mg Augmentin 875 mg 1 tablet twice daily for 7 days.  You will take this along with the Mucinex  D.  If you indeed start the Augmentin you will need to completed in its entirety.    We encourage you to use Tylenol  alternating with Ibuprofen  for  your fever if not contraindicated. (Remember to use as directed do not exceed daily dosing recommendations) We also encourage  salt water gargles for your sore throat. You should also consider throat lozenges and chloraseptic spray.   For the acute abdominal pain you can use the Pepto-Bismol this is effective for cysts no nausea as well.   ED Prescriptions     Medication Sig Dispense Auth. Provider   fluticasone  (FLONASE ) 50 MCG/ACT nasal spray Place 2 sprays into both nostrils daily for 15 days. 11.1 mL Gregoria Leas, NP   clarithromycin (BIAXIN) 500 MG tablet Take 1 tablet (500 mg total) by mouth 2 (two) times daily. 7 tablet Gregoria Leas, NP      PDMP not reviewed this encounter.   Eleanore Grey [place], [place] 08/24/23 [phone]

## 2023-08-24 NOTE — ED Triage Notes (Signed)
 Patient here today with c/o nasal congestion and sinus headache X 2 days.   Patient also c/o vomiting yesterday after eating [employer].

## 2023-08-24 NOTE — Discharge Instructions (Addendum)
 You have been diagnosed with sinusitis. We encourage conservative treatment with symptom relief.  You are encouraged to use Mucinex  D 1 tablet twice daily for at least 7 days up to 10.  You have been that prescribed fluticasone  2 sprays each nostril daily for at least 15 days.    You have been prescribed a wait-and-see antibiotic of clarithromycin 500 mg Augmentin 875 mg 1 tablet twice daily for 7 days.  You will take this along with the Mucinex  D.  If you indeed start the Augmentin you will need to completed in its entirety.    We encourage you to use Tylenol  alternating with Ibuprofen  for your fever if not contraindicated. (Remember to use as directed do not exceed daily dosing recommendations) We also encourage salt water gargles for your sore throat. You should also consider throat lozenges and chloraseptic spray.   For the acute abdominal pain you can use the Pepto-Bismol this is effective for cysts no nausea as well.

## 2023-09-01 ENCOUNTER — Encounter (HOSPITAL_COMMUNITY): Payer: Self-pay

## 2023-09-01 ENCOUNTER — Ambulatory Visit (HOSPITAL_COMMUNITY)
Admission: EM | Admit: 2023-09-01 | Discharge: 2023-09-01 | Disposition: A | Discharge status: Home / Self Care | Attending: Family Medicine | Admitting: Family Medicine

## 2023-09-01 DIAGNOSIS — K529 Noninfective gastroenteritis and colitis, unspecified: Secondary | ICD-10-CM | POA: Diagnosis not present

## 2023-09-01 MED ORDER — ONDANSETRON 4 MG PO TBDP
ORAL_TABLET | ORAL | Status: AC
  Filled 2023-09-01: qty 1

## 2023-09-01 MED ORDER — ONDANSETRON 4 MG PO TBDP
4.0000 mg | ORAL_TABLET | Freq: Once | ORAL | Status: AC
Start: 2023-09-01 — End: 2023-09-01
  Administered 2023-09-01: 4 mg via ORAL

## 2023-09-01 MED ORDER — ONDANSETRON HCL 4 MG PO TABS: 4.0000 mg | ORAL_TABLET | Freq: Two times a day (BID) | ORAL | 0 refills | Status: DC | PRN

## 2023-09-01 NOTE — ED Notes (Signed)
 Called for triage., Still has not returned from his car.

## 2023-09-01 NOTE — ED Triage Notes (Signed)
 Patient presents to the office for vomiting and diarrhea that started x2 days ago.

## 2023-09-01 NOTE — ED Provider Notes (Signed)
 [facility]    CSN: [id number] Arrival date & time: 09/01/23  1008      History   Chief Complaint Chief Complaint  Patient presents with   Emesis   Diarrhea    HPI Manuel Silva is a 38 y.o. male.   The history is provided by the patient. No language interpreter was used.  Emesis Severity:  Moderate (vomiting and diarrhea since he ate out - he ordered some Pizza) Duration:  1 day Timing:  Constant Number of daily episodes:  2 Quality:  Stomach contents Progression:  Worsening Chronicity:  New Recent urination:  Normal Relieved by:  Nothing Worsened by:  Nothing Ineffective treatments: Pepto Bismuth. Associated symptoms: abdominal pain and diarrhea   Associated symptoms: no cough, no fever, no headaches and no sore throat   Risk factors: suspect food intake   Diarrhea Diarrhea characteristics: Brown stool, no blood. Number of episodes:  4 Duration:  1 day Relieved by:  Nothing Worsened by:  Nothing Ineffective treatments: Pepto. Associated symptoms: abdominal pain and vomiting   Associated symptoms: no fever and no headaches   Risk factors: suspect food intake     Past Medical History:  Diagnosis Date   Back pain    Gastritis     There are no active problems to display for this patient.   History reviewed. No pertinent surgical history.     Home Medications    Prior to Admission medications   Medication Sig Start Date End Date Taking? Authorizing Provider  cetirizine  (ZYRTEC  ALLERGY) 10 MG tablet Take 1 tablet (10 mg total) by mouth daily. 07/03/23  Yes Levora Reas A, NP  ondansetron  (ZOFRAN ) 4 MG tablet Take 1 tablet (4 mg total) by mouth every 12 (twelve) hours as needed for nausea or vomiting. 09/01/23  Yes Arn Lane, MD  albuterol  (VENTOLIN  HFA) 108 (90 Base) MCG/ACT inhaler Inhale 2 puffs into the lungs every 4 (four) hours as needed for wheezing or shortness of breath. 06/04/23   Rodriguez-Southworth, Sylvia, PA-C   azelastine  (ASTELIN ) 0.1 % nasal spray Place 2 sprays into both nostrils 2 (two) times daily. Use in each nostril as directed 07/03/23   Levora Reas A, NP  clarithromycin  (BIAXIN ) 500 MG tablet Take 1 tablet (500 mg total) by mouth 2 (two) times daily. 08/24/23   Gregoria Leas, NP  fluticasone  (FLONASE ) 50 MCG/ACT nasal spray Place 2 sprays into both nostrils daily for 15 days. 08/24/23 09/08/23  Gregoria Leas, NP    Family History Family History  Problem Relation Age of Onset   Healthy Mother    Healthy Father     Social History Social History   Tobacco Use   Smoking status: Some Days    Types: Cigarettes   Smokeless tobacco: Never  Vaping Use   Vaping status: Some Days  Substance Use Topics   Alcohol use: Not Currently    Comment: occasionally   Drug use: No     Allergies   Penicillins   Review of Systems Review of Systems  Constitutional:  Negative for fever.  HENT:  Negative for sore throat.   Respiratory:  Negative for cough.   Gastrointestinal:  Positive for abdominal pain, diarrhea and vomiting.  Neurological:  Negative for headaches.  All other systems reviewed and are negative.    Physical Exam Triage Vital Signs ED Triage Vitals  Encounter Vitals Group     BP 09/01/23 1041 131/82     Girls Systolic BP Percentile --  Girls Diastolic BP Percentile --      Boys Systolic BP Percentile --      Boys Diastolic BP Percentile --      Pulse Rate 09/01/23 1041 82     Resp 09/01/23 1041 20     Temp 09/01/23 1041 98.2 F (36.8 C)     Temp Source 09/01/23 1041 Oral     SpO2 09/01/23 1041 94 %     Weight --      Height --      Head Circumference --      Peak Flow --      Pain Score 09/01/23 1044 5     Pain Loc --      Pain Education --      Exclude from Growth Chart --    No data found.  Updated Vital Signs BP 131/82 (BP Location: Left Arm)   Pulse 82   Temp 98.2 F (36.8 C) (Oral)   Resp 20   SpO2 94%   Visual Acuity Right Eye  Distance:   Left Eye Distance:   Bilateral Distance:    Right Eye Near:   Left Eye Near:    Bilateral Near:     Physical Exam Vitals and nursing note reviewed.  Constitutional:      General: He is not in acute distress.    Appearance: He is not ill-appearing or toxic-appearing.   Cardiovascular:     Rate and Rhythm: Normal rate and regular rhythm.     Heart sounds: Normal heart sounds. No murmur heard. Pulmonary:     Effort: Pulmonary effort is normal. No respiratory distress.     Breath sounds: Normal breath sounds. No wheezing.  Abdominal:     General: Abdomen is flat. There is no distension.     Palpations: Abdomen is soft. There is no mass.     Tenderness: There is no abdominal tenderness.      UC Treatments / Results  Labs (all labs ordered are listed, but only abnormal results are displayed) Labs Reviewed - No data to display  EKG   Radiology No results found.  Procedures Procedures (including critical care time)  Medications Ordered in UC Medications  ondansetron  (ZOFRAN -ODT) disintegrating tablet 4 mg (4 mg Oral Given 09/01/23 1057)    Initial Impression / Assessment and Plan / UC Course  I have reviewed the triage vital signs and the nursing notes.  Pertinent labs & imaging results that were available during my care of the patient were reviewed by me and considered in my medical decision making (see chart for details).  Clinical Course as of 09/01/23 1348  Sun Sep 01, 2023  1346 Gastroenteritis In the settings of food intake ?? underlying viral illness Benign GI examination Well hydrated Zofran  ODT given during this visit x 1 Zofran  ODT escribed prn N/A Use Tylenol  as needed for pain Anticipate diarrhea will improve gradually Return precautions discussed [KE]    Clinical Course User Index [KE] Arn Lane, MD     Final Clinical Impressions(s) / UC Diagnoses   Final diagnoses:  Noninfectious gastroenteritis, unspecified type      Discharge Instructions      It was nice seeing you today. I am sorry about your symptoms, likely due to food poisoning vs viral illness. We will give you Zofran  as needed for nausea. Use Tylenol  as needed for pain. This should be resolved. See us  soon if there is no improvement.      ED Prescriptions  Medication Sig Dispense Auth. Provider   ondansetron  (ZOFRAN ) 4 MG tablet Take 1 tablet (4 mg total) by mouth every 12 (twelve) hours as needed for nausea or vomiting. 12 tablet Arn Lane, MD      PDMP not reviewed this encounter.   Arn Lane, MD 09/01/23 1348

## 2023-09-01 NOTE — Discharge Instructions (Addendum)
 It was nice seeing you today. I am sorry about your symptoms, likely due to food poisoning vs viral illness. We will give you Zofran  as needed for nausea. Use Tylenol  as needed for pain. This should be resolved. See us  soon if there is no improvement.

## 2023-09-01 NOTE — ED Notes (Signed)
 Called for triage. Registration staff member stated the patient went to their car and would return.

## 2023-09-07 ENCOUNTER — Ambulatory Visit (HOSPITAL_COMMUNITY)
Admission: RE | Admit: 2023-09-07 | Discharge: 2023-09-07 | Disposition: A | Discharge status: Home / Self Care | Source: Ambulatory Visit

## 2023-09-07 ENCOUNTER — Encounter (HOSPITAL_COMMUNITY): Payer: Self-pay

## 2023-09-07 VITALS — BP 119/76 | HR 63 | Temp 97.9°F | Resp 16 | Ht 69.0 in | Wt 205.0 lb

## 2023-09-07 DIAGNOSIS — A084 Viral intestinal infection, unspecified: Secondary | ICD-10-CM | POA: Diagnosis not present

## 2023-09-07 MED ORDER — ONDANSETRON 4 MG PO TBDP: 4.0000 mg | ORAL_TABLET | Freq: Three times a day (TID) | ORAL | 0 refills | Status: DC | PRN

## 2023-09-07 MED ORDER — ONDANSETRON 4 MG PO TBDP
ORAL_TABLET | ORAL | Status: AC
Start: 2023-09-07 — End: 2023-09-07
  Filled 2023-09-07: qty 1

## 2023-09-07 MED ORDER — ONDANSETRON 4 MG PO TBDP
4.0000 mg | ORAL_TABLET | Freq: Once | ORAL | Status: AC
  Administered 2023-09-07: 4 mg via ORAL

## 2023-09-07 NOTE — Discharge Instructions (Addendum)
  1. Viral gastroenteritis (Primary) - ondansetron  (ZOFRAN -ODT) disintegrating tablet 4 mg given in UC for acute nausea and vomiting secondary to viral gastroenteritis - ondansetron  (ZOFRAN -ODT) 4 MG disintegrating tablet; Take 1 tablet (4 mg total) by mouth every 8 (eight) hours as needed for nausea or vomiting.  Dispense: 20 tablet; Refill: 0 - Drink plenty of fluids, get plenty of rest, eat mechanical soft diet to help with digestion. -Symptoms should subside within 24 to 48 hours of onset. -Continue to monitor symptoms for any change in severity if there is any escalation of current symptoms or development of new symptoms follow-up in ER for further evaluation and management.

## 2023-09-07 NOTE — ED Triage Notes (Signed)
 Chief Complaint: abdominal pain and NVD.   Sick exposure: Yes- states a lot of the guest at the same party are sick and throwing up.   Onset: since being seen on 6/15  Prescriptions or OTC medications tried: Yes- Pepto Bismol    with no relief  New foods, medications, or products: Yes- Ate chittling (pig intestine)  juice and beef tips at a get together.   Recent Travel: No

## 2023-09-07 NOTE — ED Provider Notes (Signed)
 [facility]  Note:  This document was prepared using Dragon voice recognition software and may include unintentional dictation errors.  MRN: [id number] DOB: 1985-03-22  Subjective:   Manuel Silva is a 38 y.o. male presenting for nausea/vomiting/diarrhea and abdominal cramping x 4 to 5 days.  Patient reports that he was at a family get together for Father's Day after which she developed nausea/vomiting/diarrhea.  Patient reports that other family members who are at the party also had similar symptoms.  Patient has tried Pepto-Bismol with minimal relief.  Patient denies any other secondary symptoms.  No current facility-administered medications for this encounter.  Current Outpatient Medications:    ondansetron  (ZOFRAN -ODT) 4 MG disintegrating tablet, Take 1 tablet (4 mg total) by mouth every 8 (eight) hours as needed for nausea or vomiting., Disp: 20 tablet, Rfl: 0   Allergies  Allergen Reactions   Penicillins Other (See Comments)    Unknown- was told he was allergic to PCN Has patient had a PCN reaction causing immediate rash, facial/tongue/throat swelling, SOB or lightheadedness with hypotension: unk Has patient had a PCN reaction causing severe rash involving mucus membranes or skin necrosis: unk Has patient had a PCN reaction that required hospitalization: unk Has patient had a PCN reaction occurring within the last 10 years: unk If all of the above answers are NO, then may proceed with Cephalosporin use.      Past Medical History:  Diagnosis Date   Back pain    Gastritis      History reviewed. No pertinent surgical history.  Family History  Problem Relation Age of Onset   Healthy Mother    Healthy Father     Social History   Tobacco Use   Smoking status: Some Days    Types: Cigarettes   Smokeless tobacco: Never  Vaping Use   Vaping status: Some Days  Substance Use Topics   Alcohol use: Not Currently    Comment: occasionally   Drug  use: No    ROS Refer to HPI for ROS details.  Objective:   Vitals: BP 119/76 (BP Location: Right Arm)   Pulse 63   Temp 97.9 F (36.6 C) (Oral)   Resp 16   Ht 5' 9 (1.753 m)   Wt 205 lb (93 kg)   SpO2 95%   BMI 30.27 kg/m   Physical Exam Vitals and nursing note reviewed.  Constitutional:      General: He is not in acute distress.    Appearance: He is well-developed. He is not ill-appearing or toxic-appearing.  HENT:     Head: Normocephalic.     Mouth/Throat:     Mouth: Mucous membranes are moist.   Cardiovascular:     Rate and Rhythm: Normal rate.  Pulmonary:     Effort: Pulmonary effort is normal. No respiratory distress.  Abdominal:     General: There is no distension.     Palpations: Abdomen is soft.     Tenderness: There is no abdominal tenderness. There is no right CVA tenderness, left CVA tenderness, guarding or rebound.   Skin:    General: Skin is warm and dry.   Neurological:     General: No focal deficit present.     Mental Status: He is alert and oriented to person, place, and time.   Psychiatric:        Mood and Affect: Mood normal.        Behavior: Behavior normal.     Procedures  No results found for  this or any previous visit (from the past 24 hours).  No results found.   Assessment and Plan :     Discharge Instructions       1. Viral gastroenteritis (Primary) - ondansetron  (ZOFRAN -ODT) disintegrating tablet 4 mg given in UC for acute nausea and vomiting secondary to viral gastroenteritis - ondansetron  (ZOFRAN -ODT) 4 MG disintegrating tablet; Take 1 tablet (4 mg total) by mouth every 8 (eight) hours as needed for nausea or vomiting.  Dispense: 20 tablet; Refill: 0 - Drink plenty of fluids, get plenty of rest, eat mechanical soft diet to help with digestion. -Symptoms should subside within 24 to 48 hours of onset. -Continue to monitor symptoms for any change in severity if there is any escalation of current symptoms or development  of new symptoms follow-up in ER for further evaluation and management.      Levonte Molina B [place]   Calden Dorsey, [place], [place] 09/07/23 1447

## 2023-10-04 ENCOUNTER — Ambulatory Visit (HOSPITAL_COMMUNITY)
Admission: EM | Admit: 2023-10-04 | Discharge: 2023-10-04 | Disposition: A | Discharge status: Home / Self Care | Attending: Emergency Medicine | Admitting: Emergency Medicine

## 2023-10-04 ENCOUNTER — Encounter (HOSPITAL_COMMUNITY): Payer: Self-pay | Admitting: *Deleted

## 2023-10-04 ENCOUNTER — Other Ambulatory Visit: Payer: Self-pay

## 2023-10-04 DIAGNOSIS — R0981 Nasal congestion: Secondary | ICD-10-CM | POA: Diagnosis not present

## 2023-10-04 DIAGNOSIS — R519 Headache, unspecified: Secondary | ICD-10-CM | POA: Diagnosis not present

## 2023-10-04 MED ORDER — KETOROLAC TROMETHAMINE 30 MG/ML IJ SOLN
30.0000 mg | Freq: Once | INTRAMUSCULAR | Status: AC
  Administered 2023-10-04: 30 mg via INTRAMUSCULAR

## 2023-10-04 MED ORDER — METOCLOPRAMIDE HCL 5 MG/ML IJ SOLN
5.0000 mg | Freq: Once | INTRAMUSCULAR | Status: AC
  Administered 2023-10-04: 5 mg via INTRAMUSCULAR

## 2023-10-04 MED ORDER — DEXAMETHASONE SODIUM PHOSPHATE 10 MG/ML IJ SOLN
10.0000 mg | Freq: Once | INTRAMUSCULAR | Status: AC
  Administered 2023-10-04: 10 mg via INTRAMUSCULAR

## 2023-10-04 MED ORDER — METOCLOPRAMIDE HCL 5 MG/ML IJ SOLN
INTRAMUSCULAR | Status: AC
  Filled 2023-10-04: qty 2

## 2023-10-04 MED ORDER — KETOROLAC TROMETHAMINE 30 MG/ML IJ SOLN
INTRAMUSCULAR | Status: AC
  Filled 2023-10-04: qty 1

## 2023-10-04 MED ORDER — DEXAMETHASONE SODIUM PHOSPHATE 10 MG/ML IJ SOLN
INTRAMUSCULAR | Status: AC
  Filled 2023-10-04: qty 1

## 2023-10-04 NOTE — Discharge Instructions (Signed)
 You were given a series of medications today to help with your headache: Toradol  which is an anti-inflammatory, Decadron  which is a steroid to help with inflammation, and Reglan  which is a strong nausea medication that works in combination with the 2 other medications to help relieve your headache.  If your headache persists, worsens, you develop worsening vision changes, severe dizziness, passing out, weakness, numbness, chest pain, confusion, and slurred speech please seek immediate medical treatment in the emergency department.  Otherwise go home and rest to make sure you are staying hydrated.  You can take over-the-counter cetirizine  (Zyrtec ) to help with congestion.  You can also use any over-the-counter saline nasal spray to help with congestion as well. Follow-up with your primary care provider or return here as needed.

## 2023-10-04 NOTE — ED Triage Notes (Signed)
 PT reports he has a migraine and runny niose that started today.

## 2023-10-04 NOTE — ED Provider Notes (Addendum)
 [facility]    CSN: [id number] Arrival date & time: 10/04/23  1949      History   Chief Complaint Chief Complaint  Patient presents with   Migraine   Nasal Congestion    HPI Manuel Silva is a 38 y.o. male.   Patient presents with a bad headache that began this morning.  Patient rates his headache an 8 out of 10 at this time.  Patient does endorse some photophobia and nausea with this.  Patient does report a history of bad headaches.    Patient denies taking any medication for his headache.  Patient denies numbness, tingling, weakness, confusion, slurred speech, facial droop, and blurred vision.  Patient states he has also had a little nasal congestion as well.  Denies fever, cough, sore throat, body aches, chills, vomiting, diarrhea, abdominal pain, chest pain, and shortness of breath.  Patient denies any known sick exposures.    The history is provided by the patient and medical records.  Migraine    Past Medical History:  Diagnosis Date   Back pain    Gastritis     There are no active problems to display for this patient.   History reviewed. No pertinent surgical history.     Home Medications    Prior to Admission medications   Not on File    Family History Family History  Problem Relation Age of Onset   Healthy Mother    Healthy Father     Social History Social History   Tobacco Use   Smoking status: Some Days    Types: Cigarettes   Smokeless tobacco: Never  Vaping Use   Vaping status: Some Days  Substance Use Topics   Alcohol use: Not Currently    Comment: occasionally   Drug use: No     Allergies   Penicillins   Review of Systems Review of Systems  Per HPI  Physical Exam Triage Vital Signs ED Triage Vitals  Encounter Vitals Group     BP 10/04/23 2019 121/64     Girls Systolic BP Percentile --      Girls Diastolic BP Percentile --      Boys Systolic BP Percentile --      Boys Diastolic BP Percentile  --      Pulse Rate 10/04/23 2019 (!) 57     Resp 10/04/23 2019 20     Temp 10/04/23 2019 97.7 F (36.5 C)     Temp src --      SpO2 10/04/23 2019 97 %     Weight --      Height --      Head Circumference --      Peak Flow --      Pain Score 10/04/23 2017 7     Pain Loc --      Pain Education --      Exclude from Growth Chart --    No data found.  Updated Vital Signs BP 121/64   Pulse (!) 57   Temp 97.7 F (36.5 C)   Resp 20   SpO2 97%   Visual Acuity Right Eye Distance:   Left Eye Distance:   Bilateral Distance:    Right Eye Near:   Left Eye Near:    Bilateral Near:     Physical Exam Vitals and nursing note reviewed.  Constitutional:      General: He is awake. He is not in acute distress.    Appearance: Normal appearance. He is  well-developed and well-groomed. He is not ill-appearing.  HENT:     Head: Normocephalic and atraumatic.     Right Ear: Tympanic membrane, ear canal and external ear normal.     Left Ear: Tympanic membrane, ear canal and external ear normal.     Nose: Congestion and rhinorrhea present.     Mouth/Throat:     Mouth: Mucous membranes are moist.     Pharynx: Oropharynx is clear.  Eyes:     Extraocular Movements: Extraocular movements intact.     Conjunctiva/sclera: Conjunctivae normal.     Pupils: Pupils are equal, round, and reactive to light.  Cardiovascular:     Rate and Rhythm: Normal rate and regular rhythm.  Pulmonary:     Effort: Pulmonary effort is normal.     Breath sounds: Normal breath sounds.  Musculoskeletal:        General: Normal range of motion.     Cervical back: Normal range of motion and neck supple.  Skin:    General: Skin is warm and dry.  Neurological:     General: No focal deficit present.     Mental Status: He is alert and oriented to person, place, and time. Mental status is at baseline.     GCS: GCS eye subscore is 4. GCS verbal subscore is 5. GCS motor subscore is 6.     Cranial Nerves: Cranial nerves  2-12 are intact.     Sensory: Sensation is intact.     Motor: Motor function is intact.     Coordination: Coordination is intact.     Gait: Gait is intact.  Psychiatric:        Behavior: Behavior is cooperative.      UC Treatments / Results  Labs (all labs ordered are listed, but only abnormal results are displayed) Labs Reviewed - No data to display  EKG   Radiology No results found.  Procedures Procedures (including critical care time)  Medications Ordered in UC Medications  ketorolac  (TORADOL ) 30 MG/ML injection 30 mg (has no administration in time range)  metoCLOPramide  (REGLAN ) injection 5 mg (has no administration in time range)  dexamethasone  (DECADRON ) injection 10 mg (has no administration in time range)    Initial Impression / Assessment and Plan / UC Course  I have reviewed the triage vital signs and the nursing notes.  Pertinent labs & imaging results that were available during my care of the patient were reviewed by me and considered in my medical decision making (see chart for details).     Patient is overall well-appearing.  Vitals are stable.  Mild congestion and rhinorrhea noted on exam.  No neurodeficits noted.  GCS 15.  EOMI and PERRLA.  Given IM Toradol , Decadron , and Reglan  in clinic for bad headache.  Recommend over-the-counter antihistamine and nasal spray for congestion.  Discussed follow-up, return, and strict ER precautions. Final Clinical Impressions(s) / UC Diagnoses   Final diagnoses:  Bad headache  Nasal congestion     Discharge Instructions      You were given a series of medications today to help with your headache: Toradol  which is an anti-inflammatory, Decadron  which is a steroid to help with inflammation, and Reglan  which is a strong nausea medication that works in combination with the 2 other medications to help relieve your headache.  If your headache persists, worsens, you develop worsening vision changes, severe dizziness,  passing out, weakness, numbness, chest pain, confusion, and slurred speech please seek immediate medical treatment in the emergency department.  Otherwise go home and rest to make sure you are staying hydrated.  You can take over-the-counter cetirizine  (Zyrtec ) to help with congestion.  You can also use any over-the-counter saline nasal spray to help with congestion as well. Follow-up with your primary care provider or return here as needed.   ED Prescriptions   None    PDMP not reviewed this encounter.   Johnie Rumaldo LABOR, NP 10/04/23 2031    Johnie Rumaldo A, NP 10/04/23 2031

## 2023-10-16 ENCOUNTER — Ambulatory Visit (HOSPITAL_COMMUNITY)
Admission: EM | Admit: 2023-10-16 | Discharge: 2023-10-16 | Disposition: A | Discharge status: Home / Self Care | Attending: Emergency Medicine | Admitting: Emergency Medicine

## 2023-10-16 ENCOUNTER — Encounter (HOSPITAL_COMMUNITY): Payer: Self-pay

## 2023-10-16 DIAGNOSIS — S39012A Strain of muscle, fascia and tendon of lower back, initial encounter: Secondary | ICD-10-CM

## 2023-10-16 DIAGNOSIS — M545 Low back pain, unspecified: Secondary | ICD-10-CM

## 2023-10-16 MED ORDER — KETOROLAC TROMETHAMINE 30 MG/ML IJ SOLN
INTRAMUSCULAR | Status: AC
  Filled 2023-10-16: qty 1

## 2023-10-16 MED ORDER — ACETAMINOPHEN 500 MG PO TABS
500.0000 mg | ORAL_TABLET | Freq: Four times a day (QID) | ORAL | 0 refills | Status: AC | PRN
Start: 2023-10-16 — End: ?

## 2023-10-16 MED ORDER — KETOROLAC TROMETHAMINE 30 MG/ML IJ SOLN
30.0000 mg | Freq: Once | INTRAMUSCULAR | Status: AC
  Administered 2023-10-16: 30 mg via INTRAMUSCULAR

## 2023-10-16 MED ORDER — KETOROLAC TROMETHAMINE 10 MG PO TABS: 10.0000 mg | ORAL_TABLET | Freq: Four times a day (QID) | ORAL | 0 refills | Status: DC | PRN

## 2023-10-16 MED ORDER — LIDOCAINE 5 % EX PTCH: 1.0000 | MEDICATED_PATCH | CUTANEOUS | 0 refills | Status: DC

## 2023-10-16 NOTE — Discharge Instructions (Signed)
 You received an injection of Toradol  in clinic today for your pain. Do not take any additional Toradol  for at least 8 hours after receiving this injection. You are prescribed Toradol  that you can take every 6 hours as needed for pain.  Do not take this with other NSAIDs including ibuprofen , Motrin , Advil , Aleve , and naproxen . You can take 500 mg of Tylenol  every 6-8 hours as needed for breakthrough pain as well. I also prescribed lidocaine  patches that you can apply to your low back for 12 hours at a time once daily for additional relief. Alternate between ice and heat and do some gentle stretching to avoid becoming stiff. I have attached [facility] that you can follow-up with for further evaluation and management if your back pain continues. Otherwise follow-up with your primary care provider or return here as needed.

## 2023-10-16 NOTE — ED Provider Notes (Signed)
 [facility]    CSN: [id number] Arrival date & time: 10/16/23  1649      History   Chief Complaint Chief Complaint  Patient presents with   Back Pain    HPI Manuel Silva is a 38 y.o. male.   Patient presents with bilateral low back pain that began after picking up a car battery earlier today.  Patient states that when he picked up the battery he felt his back pop and has had pain since then.  Patient denies taking medication for his pain.  Patient rates his pain a 10 out of 10 at this time.  Patient denies numbness, tingling, weakness, saddle anesthesia, bowel/bladder incontinence.  Patient denies any falls or direct injury to his back.  The history is provided by the patient and medical records.  Back Pain   Past Medical History:  Diagnosis Date   Back pain    Gastritis     There are no active problems to display for this patient.   History reviewed. No pertinent surgical history.     Home Medications    Prior to Admission medications   Medication Sig Start Date End Date Taking? Authorizing Provider  acetaminophen  (TYLENOL ) 500 MG tablet Take 1 tablet (500 mg total) by mouth every 6 (six) hours as needed. 10/16/23  Yes Johnie Flaming A, NP  ketorolac  (TORADOL ) 10 MG tablet Take 1 tablet (10 mg total) by mouth every 6 (six) hours as needed for moderate pain (pain score 4-6) or severe pain (pain score 7-10). 10/16/23  Yes Nashia Remus A, NP  lidocaine  (LIDODERM ) 5 % Place 1 patch onto the skin daily. Remove & Discard patch within 12 hours or as directed by MD 10/16/23  Yes Johnie Flaming DELENA, NP    Family History Family History  Problem Relation Age of Onset   Healthy Mother    Healthy Father     Social History Social History   Tobacco Use   Smoking status: Some Days    Types: Cigarettes   Smokeless tobacco: Never  Vaping Use   Vaping status: Some Days  Substance Use Topics   Alcohol use: Not Currently    Comment: occasionally    Drug use: No     Allergies   Penicillins   Review of Systems Review of Systems  Musculoskeletal:  Positive for back pain.   Per HPI  Physical Exam Triage Vital Signs ED Triage Vitals [10/16/23 1719]  Encounter Vitals Group     BP 123/64     Girls Systolic BP Percentile      Girls Diastolic BP Percentile      Boys Systolic BP Percentile      Boys Diastolic BP Percentile      Pulse Rate (!) 59     Resp 18     Temp 97.8 F (36.6 C)     Temp Source Oral     SpO2 97 %     Weight      Height      Head Circumference      Peak Flow      Pain Score      Pain Loc      Pain Education      Exclude from Growth Chart    No data found.  Updated Vital Signs BP 123/64 (BP Location: Left Arm)   Pulse (!) 59   Temp 97.8 F (36.6 C) (Oral)   Resp 18   SpO2 97%   Visual Acuity Right  Eye Distance:   Left Eye Distance:   Bilateral Distance:    Right Eye Near:   Left Eye Near:    Bilateral Near:     Physical Exam Vitals and nursing note reviewed.  Constitutional:      General: He is awake. He is not in acute distress.    Appearance: Normal appearance. He is well-developed and well-groomed. He is not ill-appearing.  Musculoskeletal:     Cervical back: Normal.     Thoracic back: Normal.     Lumbar back: Tenderness present. No swelling, edema, deformity, signs of trauma or bony tenderness. Normal range of motion. Negative right straight leg raise test and negative left straight leg raise test.       Back:     Comments: Tenderness noted to bilateral low back without spinous process tenderness.  Skin:    General: Skin is warm and dry.  Neurological:     Mental Status: He is alert.  Psychiatric:        Behavior: Behavior is cooperative.      UC Treatments / Results  Labs (all labs ordered are listed, but only abnormal results are displayed) Labs Reviewed - No data to display  EKG   Radiology No results found.  Procedures Procedures (including critical care  time)  Medications Ordered in UC Medications  ketorolac  (TORADOL ) 30 MG/ML injection 30 mg (has no administration in time range)    Initial Impression / Assessment and Plan / UC Course  I have reviewed the triage vital signs and the nursing notes.  Pertinent labs & imaging results that were available during my care of the patient were reviewed by me and considered in my medical decision making (see chart for details).     Patient is overall well-appearing.  Vitals are stable.  Back pain likely muscular in nature.  Given IM Toradol  in clinic for acute pain.  Prescribed Toradol  and Tylenol  as needed for pain.  Given orthopedic follow-up.  Discussed follow-up and return precautions. Final Clinical Impressions(s) / UC Diagnoses   Final diagnoses:  Acute bilateral low back pain without sciatica  Strain of lumbar region, initial encounter     Discharge Instructions      You received an injection of Toradol  in clinic today for your pain. Do not take any additional Toradol  for at least 8 hours after receiving this injection. You are prescribed Toradol  that you can take every 6 hours as needed for pain.  Do not take this with other NSAIDs including ibuprofen , Motrin , Advil , Aleve , and naproxen . You can take 500 mg of Tylenol  every 6-8 hours as needed for breakthrough pain as well. I also prescribed lidocaine  patches that you can apply to your low back for 12 hours at a time once daily for additional relief. Alternate between ice and heat and do some gentle stretching to avoid becoming stiff. I have attached [facility] that you can follow-up with for further evaluation and management if your back pain continues. Otherwise follow-up with your primary care provider or return here as needed.   ED Prescriptions     Medication Sig Dispense Auth. Provider   ketorolac  (TORADOL ) 10 MG tablet Take 1 tablet (10 mg total) by mouth every 6 (six) hours as needed for moderate pain  (pain score 4-6) or severe pain (pain score 7-10). 20 tablet Johnie Flaming A, NP   acetaminophen  (TYLENOL ) 500 MG tablet Take 1 tablet (500 mg total) by mouth every 6 (six) hours as needed.  30 tablet Johnie Flaming A, NP   lidocaine  (LIDODERM ) 5 % Place 1 patch onto the skin daily. Remove & Discard patch within 12 hours or as directed by MD 30 patch Johnie Flaming LABOR, NP      PDMP not reviewed this encounter.   Johnie Flaming A, NP 10/16/23 [phone]

## 2023-10-16 NOTE — ED Triage Notes (Addendum)
 Patient presents to the office for lower back pain. Patient was picking up a car battery today when he injured his back. Pain 10/10 Home Intervention: None

## 2023-10-21 ENCOUNTER — Ambulatory Visit (HOSPITAL_COMMUNITY)

## 2023-10-21 ENCOUNTER — Ambulatory Visit (HOSPITAL_COMMUNITY)
Admission: EM | Admit: 2023-10-21 | Discharge: 2023-10-21 | Disposition: A | Discharge status: Home / Self Care | Attending: Emergency Medicine | Admitting: Emergency Medicine

## 2023-10-21 ENCOUNTER — Encounter (HOSPITAL_COMMUNITY): Payer: Self-pay

## 2023-10-21 DIAGNOSIS — M545 Low back pain, unspecified: Secondary | ICD-10-CM

## 2023-10-21 MED ORDER — CYCLOBENZAPRINE HCL 5 MG PO TABS: 5.0000 mg | ORAL_TABLET | Freq: Three times a day (TID) | ORAL | 0 refills | Status: DC | PRN

## 2023-10-21 MED ORDER — KETOROLAC TROMETHAMINE 30 MG/ML IJ SOLN
30.0000 mg | Freq: Once | INTRAMUSCULAR | Status: AC
  Administered 2023-10-21: 30 mg via INTRAMUSCULAR

## 2023-10-21 MED ORDER — IBUPROFEN 800 MG PO TABS: 800.0000 mg | ORAL_TABLET | Freq: Three times a day (TID) | ORAL | 0 refills | Status: DC

## 2023-10-21 MED ORDER — KETOROLAC TROMETHAMINE 30 MG/ML IJ SOLN
INTRAMUSCULAR | Status: AC
  Filled 2023-10-21: qty 1

## 2023-10-21 NOTE — Discharge Instructions (Addendum)
 You can take tylenol  (acetaminophen ) 1000mg  every 6 hours. This is 2 extra strength (500mg ) tablets or 3 regular strength (325mg ) tablets every 6 hours. It is safe to take this much but do not take more than this.   Call the sports medicine offices below and ask if they take your insurance - they are both [facility] offices. Call them today to try to get an appointment - they can help you get physical therapy or other treatments if needed.   The cyclobenzaprine  (muscle relaxer) may make you sleepy. If you need to drive or work, only take it at bedtime. Otherwise you can take it up to 3 times a day.   Do not get the prescription last week for ketorolac  filled. Instead, I have prescribed prescription strength ibuprofen  for you.

## 2023-10-21 NOTE — ED Provider Notes (Signed)
 [facility]    CSN: [id number] Arrival date & time: 10/21/23  1058      History   Chief Complaint Chief Complaint  Patient presents with   Back Pain   Letter for School/Work    HPI Manuel Silva is a 38 y.o. male.   Lifted a large, oversize car battery last week and felt a pop in his back and has had severe midline low back pain since. Was seen here in UC last week for same, says the shot of toradol  helped his pain a bit but he lidocaine  patches and tylenol  500mg  po x1 q6 hrs prescribed for home use are not managing pain. Worse when he sits still, better if he gets up and moves. Denies numbness, tingling, weakness, change in gait. Did not f/u with sports medicien because he thinks his insurance will not cover it.    Back Pain   Past Medical History:  Diagnosis Date   Back pain    Gastritis     There are no active problems to display for this patient.   History reviewed. No pertinent surgical history.     Home Medications    Prior to Admission medications   Medication Sig Start Date End Date Taking? Authorizing Provider  acetaminophen  (TYLENOL ) 500 MG tablet Take 1 tablet (500 mg total) by mouth every 6 (six) hours as needed. 10/16/23   Johnie Flaming A, NP  cyclobenzaprine  (FLEXERIL ) 5 MG tablet Take 1 tablet (5 mg total) by mouth 3 (three) times daily as needed for muscle spasms. 10/21/23  Yes Richad Jon HERO, NP  ibuprofen  (ADVIL ) 800 MG tablet Take 1 tablet (800 mg total) by mouth 3 (three) times daily. 10/21/23  Yes Richad Jon HERO, NP  lidocaine  (LIDODERM ) 5 % Place 1 patch onto the skin daily. Remove & Discard patch within 12 hours or as directed by MD 10/16/23   Johnie Flaming DELENA, NP    Family History Family History  Problem Relation Age of Onset   Healthy Mother    Healthy Father     Social History Social History   Tobacco Use   Smoking status: Some Days    Types: Cigarettes   Smokeless tobacco: Never  Vaping Use   Vaping  status: Some Days  Substance Use Topics   Alcohol use: Not Currently    Comment: occasionally   Drug use: No     Allergies   Penicillins   Review of Systems Review of Systems  Musculoskeletal:  Positive for back pain.     Physical Exam Triage Vital Signs ED Triage Vitals [10/21/23 1158]  Encounter Vitals Group     BP (!) 129/97     Girls Systolic BP Percentile      Girls Diastolic BP Percentile      Boys Systolic BP Percentile      Boys Diastolic BP Percentile      Pulse Rate 74     Resp 16     Temp 97.9 F (36.6 C)     Temp Source Oral     SpO2 96 %     Weight      Height      Head Circumference      Peak Flow      Pain Score 9     Pain Loc      Pain Education      Exclude from Growth Chart    No data found.  Updated Vital Signs BP (!) 129/97 (BP Location:  Left Arm)   Pulse 74   Temp 97.9 F (36.6 C) (Oral)   Resp 16   SpO2 96%   Visual Acuity Right Eye Distance:   Left Eye Distance:   Bilateral Distance:    Right Eye Near:   Left Eye Near:    Bilateral Near:     Physical Exam Constitutional:      Appearance: Normal appearance.  Pulmonary:     Effort: Pulmonary effort is normal.  Musculoskeletal:     Lumbar back: Tenderness present. No deformity.       Back:  Neurological:     Mental Status: He is alert.     Gait: Gait normal.      UC Treatments / Results  Labs (all labs ordered are listed, but only abnormal results are displayed) Labs Reviewed - No data to display  EKG   Radiology No results found.  Procedures Procedures (including critical care time)  Medications Ordered in UC Medications  ketorolac  (TORADOL ) 30 MG/ML injection 30 mg (has no administration in time range)    Initial Impression / Assessment and Plan / UC Course  I have reviewed the triage vital signs and the nursing notes.  Pertinent labs & imaging results that were available during my care of the patient were reviewed by me and considered in my  medical decision making (see chart for details).    Will give another shot of toradol . He was prescribe oral ketorolac  last week but did not know about it/did not pick it up. I do think pain is muscle as it improves when he moves and worsens when he rests/sits still. I think his pain has been undertreated.   Final Clinical Impressions(s) / UC Diagnoses   Final diagnoses:  Acute bilateral low back pain without sciatica     Discharge Instructions      You can take tylenol  (acetaminophen ) 1000mg  every 6 hours. This is 2 extra strength (500mg ) tablets or 3 regular strength (325mg ) tablets every 6 hours. It is safe to take this much but do not take more than this.   Call the sports medicine offices below and ask if they take your insurance - they are both [facility] offices. Call them today to try to get an appointment - they can help you get physical therapy or other treatments if needed.   The cyclobenzaprine  (muscle relaxer) may make you sleepy. If you need to drive or work, only take it at bedtime. Otherwise you can take it up to 3 times a day.   Do not get the prescription last week for ketorolac  filled. Instead, I have prescribed prescription strength ibuprofen  for you.    ED Prescriptions     Medication Sig Dispense Auth. Provider   ibuprofen  (ADVIL ) 800 MG tablet Take 1 tablet (800 mg total) by mouth 3 (three) times daily. 21 tablet Jerome Viglione M, NP   cyclobenzaprine  (FLEXERIL ) 5 MG tablet Take 1 tablet (5 mg total) by mouth 3 (three) times daily as needed for muscle spasms. 21 tablet Richad Jon HERO, NP      PDMP not reviewed this encounter.   Richad Jon HERO, NP 10/21/23 1316

## 2023-10-21 NOTE — ED Triage Notes (Signed)
 Patient reports that he continues to have bilateral lower back pain and states the pain radiates into his buttocks.  Patient states he was seen on 7/30 for the same. Patient states he originally picked up a car battery. Patient states he has been taking prescribed medications for his back.

## 2024-01-05 ENCOUNTER — Encounter (HOSPITAL_COMMUNITY): Payer: Self-pay

## 2024-01-05 ENCOUNTER — Other Ambulatory Visit: Payer: Self-pay

## 2024-01-05 ENCOUNTER — Ambulatory Visit (HOSPITAL_COMMUNITY)
Admission: RE | Admit: 2024-01-05 | Discharge: 2024-01-05 | Disposition: A | Discharge status: Home / Self Care | Source: Ambulatory Visit | Attending: Internal Medicine | Admitting: Internal Medicine

## 2024-01-05 VITALS — BP 112/69 | HR 73 | Temp 98.3°F | Resp 18

## 2024-01-05 DIAGNOSIS — R051 Acute cough: Secondary | ICD-10-CM

## 2024-01-05 DIAGNOSIS — J029 Acute pharyngitis, unspecified: Secondary | ICD-10-CM

## 2024-01-05 DIAGNOSIS — R0981 Nasal congestion: Secondary | ICD-10-CM

## 2024-01-05 DIAGNOSIS — J069 Acute upper respiratory infection, unspecified: Secondary | ICD-10-CM

## 2024-01-05 LAB — POC COVID19/FLU A&B COMBO
Covid Antigen, POC: NEGATIVE
Influenza A Antigen, POC: NEGATIVE
Influenza B Antigen, POC: NEGATIVE

## 2024-01-05 LAB — POCT RAPID STREP A (OFFICE): Rapid Strep A Screen: NEGATIVE

## 2024-01-05 MED ORDER — AZELASTINE HCL 0.1 % NA SOLN: 2.0000 | Freq: Two times a day (BID) | NASAL | 0 refills | Status: DC

## 2024-01-05 MED ORDER — PREDNISONE 20 MG PO TABS: 40.0000 mg | ORAL_TABLET | Freq: Every day | ORAL | 0 refills | Status: AC

## 2024-01-05 NOTE — ED Provider Notes (Signed)
 [facility]    CSN: [id number] Arrival date & time: 01/05/24  1006      History   Chief Complaint Chief Complaint  Patient presents with   Sore Throat    HPI Manuel Silva is a 38 y.o. male.   38 y.o. male who presents to urgent care with complaints of nasal congestion and draining, headache, cough, and sore throat. He reports the symptoms started yesterday. His step son was sick but tested negative for covid. He does work at [employer] and might have been exposed to sick coworkers. He denies fevers, nausea, vomiting, ear pain, slurred speech abdominal pain. The headache is more from sinus pressure. The sore throat is causing pain with swallowing   Sore Throat Associated symptoms include headaches. Pertinent negatives include no chest pain, no abdominal pain and no shortness of breath.    Past Medical History:  Diagnosis Date   Back pain    Gastritis     There are no active problems to display for this patient.   History reviewed. No pertinent surgical history.     Home Medications    Prior to Admission medications   Medication Sig Start Date End Date Taking? Authorizing Provider  azelastine  (ASTELIN ) 0.1 % nasal spray Place 2 sprays into both nostrils 2 (two) times daily. Use in each nostril as directed 01/05/24  Yes Kikue Gerhart A, PA-C  predniSONE  (DELTASONE ) 20 MG tablet Take 2 tablets (40 mg total) by mouth daily with breakfast for 5 days. 01/05/24 01/10/24 Yes Henretta Quist A, PA-C  acetaminophen  (TYLENOL ) 500 MG tablet Take 1 tablet (500 mg total) by mouth every 6 (six) hours as needed. 10/16/23   Johnie Flaming A, NP  cyclobenzaprine  (FLEXERIL ) 5 MG tablet Take 1 tablet (5 mg total) by mouth 3 (three) times daily as needed for muscle spasms. 10/21/23   Richad Jon HERO, NP  ibuprofen  (ADVIL ) 800 MG tablet Take 1 tablet (800 mg total) by mouth 3 (three) times daily. 10/21/23   Richad Jon HERO, NP  lidocaine  (LIDODERM ) 5 % Place 1  patch onto the skin daily. Remove & Discard patch within 12 hours or as directed by MD 10/16/23   Johnie Flaming DELENA, NP    Family History Family History  Problem Relation Age of Onset   Healthy Mother    Healthy Father     Social History Social History   Tobacco Use   Smoking status: Some Days    Types: Cigarettes   Smokeless tobacco: Never  Vaping Use   Vaping status: Some Days  Substance Use Topics   Alcohol use: Not Currently    Comment: occasionally   Drug use: No     Allergies   Penicillins   Review of Systems Review of Systems  Constitutional:  Negative for chills and fever.  HENT:  Positive for congestion, rhinorrhea, sinus pressure, sore throat and trouble swallowing. Negative for ear pain.   Eyes:  Negative for pain and visual disturbance.  Respiratory:  Positive for cough (mild). Negative for shortness of breath.   Cardiovascular:  Negative for chest pain and palpitations.  Gastrointestinal:  Negative for abdominal pain and vomiting.  Genitourinary:  Negative for dysuria and hematuria.  Musculoskeletal:  Negative for arthralgias and back pain.  Skin:  Negative for color change and rash.  Neurological:  Positive for headaches. Negative for seizures and syncope.  All other systems reviewed and are negative.    Physical Exam Triage Vital Signs ED Triage Vitals  Encounter Vitals Group     BP 01/05/24 1042 112/69     Girls Systolic BP Percentile --      Girls Diastolic BP Percentile --      Boys Systolic BP Percentile --      Boys Diastolic BP Percentile --      Pulse Rate 01/05/24 1042 73     Resp 01/05/24 1042 18     Temp 01/05/24 1042 98.3 F (36.8 C)     Temp Source 01/05/24 1042 Oral     SpO2 01/05/24 1042 95 %     Weight --      Height --      Head Circumference --      Peak Flow --      Pain Score 01/05/24 1040 7     Pain Loc --      Pain Education --      Exclude from Growth Chart --    No data found.  Updated Vital Signs BP 112/69  (BP Location: Right Arm)   Pulse 73   Temp 98.3 F (36.8 C) (Oral)   Resp 18   SpO2 95%   Visual Acuity Right Eye Distance:   Left Eye Distance:   Bilateral Distance:    Right Eye Near:   Left Eye Near:    Bilateral Near:     Physical Exam Vitals and nursing note reviewed.  Constitutional:      General: He is not in acute distress.    Appearance: He is well-developed.  HENT:     Head: Normocephalic and atraumatic.     Right Ear: There is impacted cerumen.     Left Ear: Tympanic membrane normal.     Nose: Congestion and rhinorrhea present. Rhinorrhea is clear.     Mouth/Throat:     Mouth: Mucous membranes are moist.     Pharynx: Oropharyngeal exudate and posterior oropharyngeal erythema present.  Eyes:     Conjunctiva/sclera: Conjunctivae normal.     Pupils: Pupils are equal, round, and reactive to light.  Cardiovascular:     Rate and Rhythm: Normal rate and regular rhythm.     Heart sounds: No murmur heard. Pulmonary:     Effort: Pulmonary effort is normal. No respiratory distress.     Breath sounds: Normal breath sounds. No decreased breath sounds or wheezing.  Abdominal:     Palpations: Abdomen is soft.     Tenderness: There is no abdominal tenderness.  Musculoskeletal:        General: No swelling.     Cervical back: Neck supple.  Skin:    General: Skin is warm and dry.     Capillary Refill: Capillary refill takes less than 2 seconds.  Neurological:     General: No focal deficit present.     Mental Status: He is alert.  Psychiatric:        Mood and Affect: Mood normal.      UC Treatments / Results  Labs (all labs ordered are listed, but only abnormal results are displayed) Labs Reviewed  POC COVID19/FLU A&B COMBO  POCT RAPID STREP A (OFFICE)    EKG   Radiology No results found.  Procedures Procedures (including critical care time)  Medications Ordered in UC Medications - No data to display  Initial Impression / Assessment and Plan / UC  Course  I have reviewed the triage vital signs and the nursing notes.  Pertinent labs & imaging results that were available during my care of the  patient were reviewed by me and considered in my medical decision making (see chart for details).     Sore throat - Plan: POC Covid19/Flu A&B Antigen, POC rapid strep A, POC Covid19/Flu A&B Antigen, POC rapid strep A  Nasal congestion - Plan: POC Covid19/Flu A&B Antigen, POC Covid19/Flu A&B Antigen  Acute cough - Plan: POC Covid19/Flu A&B Antigen, POC Covid19/Flu A&B Antigen  Viral upper respiratory illness   Flu A, flu B, covid and strep testing done today due to symptoms.  These are all negative. Symptoms have only been going on for 24 which is more consistent with a viral upper respiratory illness. This does not require antibiotic treatment.  We focus treatment on improving the symptoms.  We will treat with the following:  Astelin  (azelastine ) 2 sprays each nostril twice daily for 7 days for nasal congestion.  May use as needed after this. Prednisone  40 mg (2 tablets) once daily for 5 days. Take this in the morning.  This is a steroid to help with inflammation and pain.  Do not take ibuprofen  while you are taking this medication.  You may use Tylenol . Can try taking over the counter zyrtec  once daily to help with symptom relief Make sure to stay hydrated by drinking plenty of water. Return to urgent care or PCP if symptoms worsen or fail to resolve.    Final Clinical Impressions(s) / UC Diagnoses   Final diagnoses:  Sore throat  Nasal congestion  Acute cough  Viral upper respiratory illness     Discharge Instructions      Flu A, flu B, covid and strep testing done today due to symptoms.  These are all negative. Symptoms have only been going on for 24 which is more consistent with a viral upper respiratory illness. This does not require antibiotic treatment.  We focus treatment on improving the symptoms.  We will treat with the  following:  Astelin  (azelastine ) 2 sprays each nostril twice daily for 7 days for nasal congestion.  May use as needed after this. Prednisone  40 mg (2 tablets) once daily for 5 days. Take this in the morning.  This is a steroid to help with inflammation and pain.  Do not take ibuprofen  while you are taking this medication.  You may use Tylenol . Can try taking over the counter zyrtec  once daily to help with symptom relief Make sure to stay hydrated by drinking plenty of water. Return to urgent care or PCP if symptoms worsen or fail to resolve.       ED Prescriptions     Medication Sig Dispense Auth. Provider   azelastine  (ASTELIN ) 0.1 % nasal spray Place 2 sprays into both nostrils 2 (two) times daily. Use in each nostril as directed 30 mL Monaye Blackie A, PA-C   predniSONE  (DELTASONE ) 20 MG tablet Take 2 tablets (40 mg total) by mouth daily with breakfast for 5 days. 10 tablet Teresa Almarie LABOR, [place]      PDMP not reviewed this encounter.   Teresa Almarie LABOR, PA-C 01/05/24 1152

## 2024-01-05 NOTE — ED Notes (Signed)
 Obtained strep and nasal swab for testing and placed in lab

## 2024-01-05 NOTE — ED Notes (Signed)
Attempt to call from lobby. 

## 2024-01-05 NOTE — Discharge Instructions (Addendum)
 Flu A, flu B, covid and strep testing done today due to symptoms.  These are all negative. Symptoms have only been going on for 24 which is more consistent with a viral upper respiratory illness. This does not require antibiotic treatment.  We focus treatment on improving the symptoms.  We will treat with the following:  Astelin  (azelastine ) 2 sprays each nostril twice daily for 7 days for nasal congestion.  May use as needed after this. Prednisone  40 mg (2 tablets) once daily for 5 days. Take this in the morning.  This is a steroid to help with inflammation and pain.  Do not take ibuprofen  while you are taking this medication.  You may use Tylenol . Can try taking over the counter zyrtec  once daily to help with symptom relief Make sure to stay hydrated by drinking plenty of water. Return to urgent care or PCP if symptoms worsen or fail to resolve.

## 2024-01-05 NOTE — ED Triage Notes (Signed)
 Headache, head congestion and sore throat.  Symptoms started yesterday.  Denies fever.  Took theraflu

## 2024-02-02 ENCOUNTER — Ambulatory Visit (HOSPITAL_COMMUNITY)

## 2024-02-06 ENCOUNTER — Ambulatory Visit (HOSPITAL_COMMUNITY)

## 2024-02-09 ENCOUNTER — Ambulatory Visit (HOSPITAL_COMMUNITY)

## 2024-02-09 ENCOUNTER — Encounter (HOSPITAL_COMMUNITY): Payer: Self-pay | Admitting: *Deleted

## 2024-02-09 ENCOUNTER — Other Ambulatory Visit: Payer: Self-pay

## 2024-02-09 ENCOUNTER — Ambulatory Visit (HOSPITAL_COMMUNITY)
Admission: EM | Admit: 2024-02-09 | Discharge: 2024-02-09 | Disposition: A | Discharge status: Home / Self Care | Attending: Nurse Practitioner | Admitting: Nurse Practitioner

## 2024-02-09 DIAGNOSIS — K2900 Acute gastritis without bleeding: Secondary | ICD-10-CM | POA: Insufficient documentation

## 2024-02-09 DIAGNOSIS — R1013 Epigastric pain: Secondary | ICD-10-CM | POA: Diagnosis present

## 2024-02-09 DIAGNOSIS — R112 Nausea with vomiting, unspecified: Secondary | ICD-10-CM | POA: Insufficient documentation

## 2024-02-09 LAB — LIPASE, BLOOD: Lipase: 32 U/L (ref 11–51)

## 2024-02-09 LAB — CBC
HCT: 41.8 % (ref 39.0–52.0)
Hemoglobin: 14.2 g/dL (ref 13.0–17.0)
MCH: 30.2 pg (ref 26.0–34.0)
MCHC: 34 g/dL (ref 30.0–36.0)
MCV: 88.9 fL (ref 80.0–100.0)
Platelets: 254 K/uL (ref 150–400)
RBC: 4.7 MIL/uL (ref 4.22–5.81)
RDW: 14.4 % (ref 11.5–15.5)
WBC: 7.6 K/uL (ref 4.0–10.5)
nRBC: 0 % (ref 0.0–0.2)

## 2024-02-09 LAB — POCT URINE DIPSTICK
Bilirubin, UA: NEGATIVE
Blood, UA: NEGATIVE
Glucose, UA: NEGATIVE mg/dL
Ketones, POC UA: NEGATIVE mg/dL
Leukocytes, UA: NEGATIVE
Nitrite, UA: NEGATIVE
POC PROTEIN,UA: NEGATIVE
Spec Grav, UA: 1.02 (ref 1.010–1.025)
Urobilinogen, UA: >=8 U/dL — AB
pH, UA: 7 (ref 5.0–8.0)

## 2024-02-09 LAB — COMPREHENSIVE METABOLIC PANEL WITH GFR
ALT: 66 U/L — ABNORMAL HIGH (ref 0–44)
AST: 36 U/L (ref 15–41)
Albumin: 3.7 g/dL (ref 3.5–5.0)
Alkaline Phosphatase: 118 U/L (ref 38–126)
Anion gap: 10 (ref 5–15)
BUN: 9 mg/dL (ref 6–20)
CO2: 24 mmol/L (ref 22–32)
Calcium: 8.9 mg/dL (ref 8.9–10.3)
Chloride: 103 mmol/L (ref 98–111)
Creatinine, Ser: 0.97 mg/dL (ref 0.61–1.24)
GFR, Estimated: >60 mL/min (ref >60)
Glucose, Bld: 125 mg/dL — ABNORMAL HIGH (ref 70–99)
Potassium: 4 mmol/L (ref 3.5–5.1)
Sodium: 137 mmol/L (ref 135–145)
Total Bilirubin: 0.6 mg/dL (ref 0.0–1.2)
Total Protein: 7.3 g/dL (ref 6.5–8.1)

## 2024-02-09 MED ORDER — ONDANSETRON 4 MG PO TBDP: 4.0000 mg | ORAL_TABLET | Freq: Three times a day (TID) | ORAL | 0 refills | Status: DC | PRN

## 2024-02-09 MED ORDER — ALUM & MAG HYDROXIDE-SIMETH 200-200-20 MG/5ML PO SUSP
30.0000 mL | Freq: Once | ORAL | Status: AC
  Administered 2024-02-09: 30 mL via ORAL

## 2024-02-09 MED ORDER — LIDOCAINE VISCOUS HCL 2 % MT SOLN
15.0000 mL | Freq: Once | OROMUCOSAL | Status: AC
  Administered 2024-02-09: 15 mL via OROMUCOSAL

## 2024-02-09 MED ORDER — LIDOCAINE VISCOUS HCL 2 % MT SOLN
OROMUCOSAL | Status: AC
  Filled 2024-02-09: qty 15

## 2024-02-09 MED ORDER — FAMOTIDINE 20 MG PO TABS: 20.0000 mg | ORAL_TABLET | Freq: Two times a day (BID) | ORAL | 0 refills | Status: DC

## 2024-02-09 MED ORDER — ONDANSETRON 4 MG PO TBDP
4.0000 mg | ORAL_TABLET | Freq: Once | ORAL | Status: AC
  Administered 2024-02-09: 4 mg via ORAL

## 2024-02-09 MED ORDER — ONDANSETRON 4 MG PO TBDP
ORAL_TABLET | ORAL | Status: AC
  Filled 2024-02-09: qty 1

## 2024-02-09 MED ORDER — ALUM & MAG HYDROXIDE-SIMETH 200-200-20 MG/5ML PO SUSP
ORAL | Status: AC
  Filled 2024-02-09: qty 30

## 2024-02-09 NOTE — ED Provider Notes (Signed)
 [facility]    CSN: [id number] Arrival date & time: 02/09/24  1248      History   Chief Complaint Chief Complaint  Patient presents with   Abdominal Pain   Emesis    HPI Manuel Silva is a 38 y.o. male.   Manuel Silva is a 38 y.o. male presenting for chief complaint of epigastric abdominal pain, nausea, increased belching, and bloating to the abdomen that started approximately 4 days ago.  He has been persistently nauseous for the last 4 days but has still been able to eat and drink without vomiting until today when he had his first episode of emesis.  No blood in output.  Pain is localized to the upper abdomen, denies pain to the lower abdomen.   He takes ibuprofen  1-2 times per week for headaches, otherwise denies frequent use of NSAIDs.  Denies recent changes in bowel pattern.  He is a light smoker-smokes Black and mild cigars and sometimes smokes cigarettes. His children are currently sick with cough and congestion, no other recent sick contacts with nausea or vomiting.  Denies dizziness, constipation, diarrhea, flank pain, heart palpitations, shortness of breath, upper chest pain, and fever/chills.  He denies recent intake of spicy/acidic foods, illicit drug use, and intake of alcohol.   Denies recent intake of raw/undercooked vegetables or recent international travel.    Abdominal Pain Associated symptoms: vomiting   Emesis Associated symptoms: abdominal pain     Past Medical History:  Diagnosis Date   Back pain    Gastritis     There are no active problems to display for this patient.   History reviewed. No pertinent surgical history.     Home Medications    Prior to Admission medications   Medication Sig Start Date End Date Taking? Authorizing Provider  famotidine  (PEPCID ) 20 MG tablet Take 1 tablet (20 mg total) by mouth 2 (two) times daily. 02/09/24  Yes Enedelia Dorna HERO, FNP  ondansetron  (ZOFRAN -ODT) 4 MG disintegrating  tablet Take 1 tablet (4 mg total) by mouth every 8 (eight) hours as needed for nausea or vomiting. 02/09/24  Yes Enedelia Dorna HERO, FNP  acetaminophen  (TYLENOL ) 500 MG tablet Take 1 tablet (500 mg total) by mouth every 6 (six) hours as needed. 10/16/23   Johnie Flaming A, NP  azelastine  (ASTELIN ) 0.1 % nasal spray Place 2 sprays into both nostrils 2 (two) times daily. Use in each nostril as directed 01/05/24   Teresa Almarie DELENA, PA-C  cyclobenzaprine  (FLEXERIL ) 5 MG tablet Take 1 tablet (5 mg total) by mouth 3 (three) times daily as needed for muscle spasms. 10/21/23   Richad Jon HERO, NP  ibuprofen  (ADVIL ) 800 MG tablet Take 1 tablet (800 mg total) by mouth 3 (three) times daily. 10/21/23   Kabbe, Angela M, NP  lidocaine  (LIDODERM ) 5 % Place 1 patch onto the skin daily. Remove & Discard patch within 12 hours or as directed by MD 10/16/23   Johnie Flaming DELENA, NP    Family History Family History  Problem Relation Age of Onset   Healthy Mother    Healthy Father     Social History Social History   Tobacco Use   Smoking status: Some Days    Types: Cigarettes   Smokeless tobacco: Never  Vaping Use   Vaping status: Some Days  Substance Use Topics   Alcohol use: Not Currently    Comment: occasionally   Drug use: No     Allergies   Penicillins  Review of Systems Review of Systems  Gastrointestinal:  Positive for abdominal pain and vomiting.  Per HPI   Physical Exam Triage Vital Signs ED Triage Vitals  Encounter Vitals Group     BP 02/09/24 1511 126/81     Girls Systolic BP Percentile --      Girls Diastolic BP Percentile --      Boys Systolic BP Percentile --      Boys Diastolic BP Percentile --      Pulse Rate 02/09/24 1511 71     Resp 02/09/24 1511 18     Temp 02/09/24 1511 99.1 F (37.3 C)     Temp src --      SpO2 02/09/24 1511 97 %     Weight 02/09/24 1602 198 lb (89.8 kg)     Height 02/09/24 1602 5' 9 (1.753 m)     Head Circumference --      Peak Flow --       Pain Score 02/09/24 1512 7     Pain Loc --      Pain Education --      Exclude from Growth Chart --    No data found.  Updated Vital Signs BP 126/81   Pulse 71   Temp 99.1 F (37.3 C)   Resp 18   Ht 5' 9 (1.753 m)   Wt 198 lb (89.8 kg)   SpO2 97%   BMI 29.24 kg/m   Visual Acuity Right Eye Distance:   Left Eye Distance:   Bilateral Distance:    Right Eye Near:   Left Eye Near:    Bilateral Near:     Physical Exam Vitals and nursing note reviewed.  Constitutional:      Appearance: He is not ill-appearing or toxic-appearing.  HENT:     Head: Normocephalic and atraumatic.     Right Ear: Hearing and external ear normal.     Left Ear: Hearing and external ear normal.     Nose: Nose normal.     Mouth/Throat:     Lips: Pink.     Mouth: Mucous membranes are moist.  Eyes:     General: Lids are normal. Vision grossly intact. Gaze aligned appropriately.     Extraocular Movements: Extraocular movements intact.     Conjunctiva/sclera: Conjunctivae normal.  Cardiovascular:     Rate and Rhythm: Normal rate and regular rhythm.     Heart sounds: Normal heart sounds, S1 normal and S2 normal.  Pulmonary:     Effort: Pulmonary effort is normal. No respiratory distress.     Breath sounds: Normal breath sounds and air entry.  Abdominal:     General: Abdomen is flat. Bowel sounds are normal.     Palpations: Abdomen is soft.     Tenderness: There is abdominal tenderness in the epigastric area and left upper quadrant. There is no right CVA tenderness, left CVA tenderness, guarding or rebound. Negative signs include Murphy's sign and McBurney's sign.  Musculoskeletal:     Cervical back: Neck supple.  Skin:    General: Skin is warm and dry.     Capillary Refill: Capillary refill takes less than 2 seconds.     Findings: No rash.  Neurological:     General: No focal deficit present.     Mental Status: He is alert and oriented to person, place, and time. Mental status is at  baseline.     Cranial Nerves: No dysarthria or facial asymmetry.  Psychiatric:  Mood and Affect: Mood normal.        Speech: Speech normal.        Behavior: Behavior normal.        Thought Content: Thought content normal.        Judgment: Judgment normal.      UC Treatments / Results  Labs (all labs ordered are listed, but only abnormal results are displayed) Labs Reviewed  POCT URINE DIPSTICK - Abnormal; Notable for the following components:      Result Value   Urobilinogen, UA >=8.0 (*)    All other components within normal limits  CBC  COMPREHENSIVE METABOLIC PANEL WITH GFR  LIPASE, BLOOD    EKG   Radiology No results found.  Procedures Procedures (including critical care time)  Medications Ordered in UC Medications  ondansetron  (ZOFRAN -ODT) disintegrating tablet 4 mg (4 mg Oral Given 02/09/24 1607)  alum & mag hydroxide-simeth (MAALOX/MYLANTA) 200-200-20 MG/5ML suspension 30 mL (30 mLs Oral Given 02/09/24 1714)  lidocaine  (XYLOCAINE ) 2 % viscous mouth solution 15 mL (15 mLs Mouth/Throat Given 02/09/24 1714)    Initial Impression / Assessment and Plan / UC Course  I have reviewed the triage vital signs and the nursing notes.  Pertinent labs & imaging results that were available during my care of the patient were reviewed by me and considered in my medical decision making (see chart for details).   1. Epigastric abdominal pain, nausea and vomiting, acute gastritis Evaluation suggests gastritis etiology.  Low suspicion for ACS/musculoskeletal etiology of discomfort. EKG shows normal sinus rhythm, nonischemic, no ST/T wave changes. Will trial use of famotidine  as directed.  GI cocktail given in clinic for symptomatic relief. Discussed avoidance of GERD triggers and lifestyle changes (elevating head of bed, etc).  Further information placed in AVS for patient review.  PCP follow-up in 1-2 weeks should symptoms fail to improve with use of medicines above.    Counseled patient on potential for adverse effects with medications prescribed/recommended today, strict ER and return-to-clinic precautions discussed, patient verbalized understanding.    Final Clinical Impressions(s) / UC Diagnoses   Final diagnoses:  Nausea and vomiting, unspecified vomiting type  Abdominal pain, epigastric  Acute gastritis without hemorrhage, unspecified gastritis type     Discharge Instructions      Take prescribed medicines as directed. Medicine will help reduce the amount of acid your stomach makes and therefore improve your reflux symptoms related to acid production.   Take famotidine  20mg  every morning and every evening for the next 4 weeks.   Take zofran  every 8 hours as needed for nausea/vomiting.  Blood work will come back in the next 12-24 hours, staff will call if abnormal.   Avoid spicy or acidic foods like tomatoes, chocolate, coffee, or acidic fruits like oranges as these can trigger symptoms.  I have included acid reflux education in your packet for your review. Please also allow 2 hours after meals before lying flat to help prevent symptoms.   If your symptoms do not improve in the next 5-7 days with interventions, please return. Go to the emergency room for severe symptoms of shortness of breath, worsening or uncontrolled abdominal or chest pain, headache, light headedness, feeling faint, nausea, vomiting, bloody vomit or stools, black tarry stools, or any other new/severe symptoms. I hope you feel better!      ED Prescriptions     Medication Sig Dispense Auth. Provider   famotidine  (PEPCID ) 20 MG tablet Take 1 tablet (20 mg total) by mouth 2 (two)  times daily. 30 tablet Enedelia Dorna HERO, FNP   ondansetron  (ZOFRAN -ODT) 4 MG disintegrating tablet Take 1 tablet (4 mg total) by mouth every 8 (eight) hours as needed for nausea or vomiting. 20 tablet Enedelia Dorna HERO, FNP      PDMP not reviewed this encounter.   Enedelia Dorna HERO, [place] 02/09/24 1735

## 2024-02-09 NOTE — Discharge Instructions (Addendum)
 Take prescribed medicines as directed. Medicine will help reduce the amount of acid your stomach makes and therefore improve your reflux symptoms related to acid production.   Take famotidine  20mg  every morning and every evening for the next 4 weeks.   Take zofran  every 8 hours as needed for nausea/vomiting.  Blood work will come back in the next 12-24 hours, staff will call if abnormal.   Avoid spicy or acidic foods like tomatoes, chocolate, coffee, or acidic fruits like oranges as these can trigger symptoms.  I have included acid reflux education in your packet for your review. Please also allow 2 hours after meals before lying flat to help prevent symptoms.   If your symptoms do not improve in the next 5-7 days with interventions, please return. Go to the emergency room for severe symptoms of shortness of breath, worsening or uncontrolled abdominal or chest pain, headache, light headedness, feeling faint, nausea, vomiting, bloody vomit or stools, black tarry stools, or any other new/severe symptoms. I hope you feel better!

## 2024-02-09 NOTE — ED Triage Notes (Signed)
 PT reports for 4 days he has had ABD pain with nausea ,vomiting . Pt feels like he may get a HA. Pt reports others in the house have also been sick.

## 2024-02-10 ENCOUNTER — Ambulatory Visit (HOSPITAL_COMMUNITY): Payer: Self-pay

## 2024-02-16 ENCOUNTER — Ambulatory Visit (HOSPITAL_COMMUNITY): Payer: Self-pay | Admitting: Physician Assistant

## 2024-02-16 ENCOUNTER — Encounter (HOSPITAL_COMMUNITY): Payer: Self-pay | Admitting: Emergency Medicine

## 2024-02-16 ENCOUNTER — Other Ambulatory Visit: Payer: Self-pay

## 2024-02-16 ENCOUNTER — Ambulatory Visit (HOSPITAL_COMMUNITY)

## 2024-02-16 ENCOUNTER — Ambulatory Visit (HOSPITAL_COMMUNITY)
Admission: EM | Admit: 2024-02-16 | Discharge: 2024-02-16 | Disposition: A | Discharge status: Home / Self Care | Attending: Physician Assistant | Admitting: Physician Assistant

## 2024-02-16 DIAGNOSIS — K529 Noninfective gastroenteritis and colitis, unspecified: Secondary | ICD-10-CM | POA: Diagnosis present

## 2024-02-16 DIAGNOSIS — R197 Diarrhea, unspecified: Secondary | ICD-10-CM | POA: Diagnosis present

## 2024-02-16 DIAGNOSIS — R109 Unspecified abdominal pain: Secondary | ICD-10-CM | POA: Diagnosis present

## 2024-02-16 DIAGNOSIS — R112 Nausea with vomiting, unspecified: Secondary | ICD-10-CM | POA: Insufficient documentation

## 2024-02-16 LAB — POCT URINE DIPSTICK
Bilirubin, UA: NEGATIVE
Blood, UA: NEGATIVE
Glucose, UA: NEGATIVE mg/dL
Leukocytes, UA: NEGATIVE
Nitrite, UA: NEGATIVE
POC PROTEIN,UA: NEGATIVE
Spec Grav, UA: 1.025 (ref 1.010–1.025)
Urobilinogen, UA: 0.2 U/dL
pH, UA: 6 (ref 5.0–8.0)

## 2024-02-16 LAB — COMPREHENSIVE METABOLIC PANEL WITH GFR
ALT: 50 U/L — ABNORMAL HIGH (ref 0–44)
AST: 26 U/L (ref 15–41)
Albumin: 3.7 g/dL (ref 3.5–5.0)
Alkaline Phosphatase: 95 U/L (ref 38–126)
Anion gap: 10 (ref 5–15)
BUN: 8 mg/dL (ref 6–20)
CO2: 25 mmol/L (ref 22–32)
Calcium: 9.1 mg/dL (ref 8.9–10.3)
Chloride: 104 mmol/L (ref 98–111)
Creatinine, Ser: 0.98 mg/dL (ref 0.61–1.24)
GFR, Estimated: >60 mL/min (ref >60)
Glucose, Bld: 100 mg/dL — ABNORMAL HIGH (ref 70–99)
Potassium: 4.3 mmol/L (ref 3.5–5.1)
Sodium: 139 mmol/L (ref 135–145)
Total Bilirubin: 0.6 mg/dL (ref 0.0–1.2)
Total Protein: 6.9 g/dL (ref 6.5–8.1)

## 2024-02-16 LAB — CBC WITH DIFFERENTIAL/PLATELET
Abs Immature Granulocytes: 0.05 K/uL (ref 0.00–0.07)
Basophils Absolute: 0 K/uL (ref 0.0–0.1)
Basophils Relative: 0 %
Eosinophils Absolute: 0.4 K/uL (ref 0.0–0.5)
Eosinophils Relative: 5 %
HCT: 41 % (ref 39.0–52.0)
Hemoglobin: 13.6 g/dL (ref 13.0–17.0)
Immature Granulocytes: 1 %
Lymphocytes Relative: 44 %
Lymphs Abs: 3.6 K/uL (ref 0.7–4.0)
MCH: 30.1 pg (ref 26.0–34.0)
MCHC: 33.2 g/dL (ref 30.0–36.0)
MCV: 90.7 fL (ref 80.0–100.0)
Monocytes Absolute: 0.5 K/uL (ref 0.1–1.0)
Monocytes Relative: 6 %
Neutro Abs: 3.6 K/uL (ref 1.7–7.7)
Neutrophils Relative %: 44 %
Platelets: 302 K/uL (ref 150–400)
RBC: 4.52 MIL/uL (ref 4.22–5.81)
RDW: 14.4 % (ref 11.5–15.5)
WBC: 8.2 K/uL (ref 4.0–10.5)
nRBC: 0 % (ref 0.0–0.2)

## 2024-02-16 LAB — LIPASE, BLOOD: Lipase: 28 U/L (ref 11–51)

## 2024-02-16 MED ORDER — ONDANSETRON 4 MG PO TBDP: 4.0000 mg | ORAL_TABLET | Freq: Three times a day (TID) | ORAL | 0 refills | Status: DC | PRN

## 2024-02-16 MED ORDER — ALUM & MAG HYDROXIDE-SIMETH 200-200-20 MG/5ML PO SUSP
30.0000 mL | Freq: Once | ORAL | Status: AC
  Administered 2024-02-16: 30 mL via ORAL

## 2024-02-16 MED ORDER — ONDANSETRON 4 MG PO TBDP
ORAL_TABLET | ORAL | Status: AC
  Filled 2024-02-16: qty 1

## 2024-02-16 MED ORDER — ALUM & MAG HYDROXIDE-SIMETH 200-200-20 MG/5ML PO SUSP
ORAL | Status: AC
  Filled 2024-02-16: qty 30

## 2024-02-16 MED ORDER — ONDANSETRON 4 MG PO TBDP
4.0000 mg | ORAL_TABLET | Freq: Once | ORAL | Status: AC
  Administered 2024-02-16: 4 mg via ORAL

## 2024-02-16 MED ORDER — DICYCLOMINE HCL 20 MG PO TABS: 20.0000 mg | ORAL_TABLET | Freq: Two times a day (BID) | ORAL | 0 refills | Status: DC

## 2024-02-16 NOTE — ED Triage Notes (Signed)
 02/14/2024 started having abdominal pain, vomiting and some diarrhea.  Today, no vomiting, but is still nauseated.  No diarrhea today  Has had tylenol , pepto bismal.  Patient has a headache.     Nurse notes there is a visit on 02/09/2024   (prior to Thanksgiving) with similar presentation

## 2024-02-16 NOTE — ED Provider Notes (Signed)
 [facility]    CSN: [id number] Arrival date & time: 02/16/24  1007      History   Chief Complaint No chief complaint on file.   HPI Manuel Silva is a 38 y.o. male.   Patient presents today with a 2-day history of GI symptoms.  He reports abdominal cramping and bloating with pain rated 7 on a 0-10 pain scale, described as cramping, no aggravating or alleviating factors notified.  He reports that pain is worse in his upper abdomen but does involve his entire abdomen.  He has had nausea and vomiting with last episode of emesis yesterday.  Denies any associated hematemesis.  He has also had significant diarrhea with 10+ watery bowel movements per 24 hours without blood or mucus.  He denies any known sick contacts but was around many people for Thanksgiving.  He denies any suspicious food intake or dietary changes.  Denies any recent travel, medication changes, antibiotic use.  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  He has not take NSAIDs on a regular basis.  He has tried Pepto-Bismol with temporary improvement of symptoms.  Denies previous abdominal surgery and so has gallbladder and appendix.  He has never seen a gastroenterologist.  He denies any additional symptoms including fever, cough, congestion, sore throat.    Past Medical History:  Diagnosis Date   Back pain    Gastritis     There are no active problems to display for this patient.   History reviewed. No pertinent surgical history.     Home Medications    Prior to Admission medications   Medication Sig Start Date End Date Taking? Authorizing Provider  dicyclomine  (BENTYL ) 20 MG tablet Take 1 tablet (20 mg total) by mouth 2 (two) times daily. 02/16/24  Yes Onna Nodal K, PA-C  ondansetron  (ZOFRAN -ODT) 4 MG disintegrating tablet Take 1 tablet (4 mg total) by mouth every 8 (eight) hours as needed for  nausea or vomiting. 02/16/24  Yes Okema Rollinson K, PA-C  acetaminophen  (TYLENOL ) 500 MG tablet Take 1 tablet (500 mg total) by mouth every 6 (six) hours as needed. 10/16/23   Johnie Flaming A, NP  cyclobenzaprine  (FLEXERIL ) 5 MG tablet Take 1 tablet (5 mg total) by mouth 3 (three) times daily as needed for muscle spasms. 10/21/23   Richad Jon HERO, NP  ibuprofen  (ADVIL ) 800 MG tablet Take 1 tablet (800 mg total) by mouth 3 (three) times daily. 10/21/23   Richad Jon HERO, NP  lidocaine  (LIDODERM ) 5 % Place 1 patch onto the skin daily. Remove & Discard patch within 12 hours or as directed by MD 10/16/23   Johnie Flaming DELENA, NP    Family History Family History  Problem Relation Age of Onset   Healthy Mother    Healthy Father     Social History Social History   Tobacco Use   Smoking status: Some Days    Types: Cigarettes   Smokeless tobacco: Never  Vaping Use   Vaping status: Some Days  Substance Use Topics   Alcohol use: Not Currently    Comment: occasionally   Drug use: No     Allergies   Penicillins   Review of Systems Review of Systems  Constitutional:  Positive for activity change and appetite change. Negative for fatigue and fever.  HENT:  Negative for congestion and sore throat.   Respiratory:  Negative for cough and  shortness of breath.   Cardiovascular:  Negative for chest pain.  Gastrointestinal:  Positive for abdominal pain (cramping), diarrhea, nausea and vomiting (one episode yesterday).     Physical Exam Triage Vital Signs ED Triage Vitals  Encounter Vitals Group     BP 02/16/24 1044 112/70     Girls Systolic BP Percentile --      Girls Diastolic BP Percentile --      Boys Systolic BP Percentile --      Boys Diastolic BP Percentile --      Pulse Rate 02/16/24 1044 68     Resp 02/16/24 1044 18     Temp 02/16/24 1044 97.8 F (36.6 C)     Temp Source 02/16/24 1044 Oral     SpO2 02/16/24 1044 98 %     Weight --      Height --      Head Circumference --       Peak Flow --      Pain Score 02/16/24 1042 7     Pain Loc --      Pain Education --      Exclude from Growth Chart --    No data found.  Updated Vital Signs BP 112/70 (BP Location: Left Arm)   Pulse 68   Temp 97.8 F (36.6 C) (Oral)   Resp 18   SpO2 98%   Visual Acuity Right Eye Distance:   Left Eye Distance:   Bilateral Distance:    Right Eye Near:   Left Eye Near:    Bilateral Near:     Physical Exam Vitals reviewed.  Constitutional:      General: He is awake.     Appearance: Normal appearance. He is well-developed. He is not ill-appearing.     Comments: Very pleasant male appears stated age in no acute distress sitting comfortably in exam room  HENT:     Head: Normocephalic and atraumatic.     Mouth/Throat:     Mouth: Mucous membranes are moist.  Cardiovascular:     Rate and Rhythm: Normal rate and regular rhythm.     Heart sounds: Normal heart sounds, S1 normal and S2 normal. No murmur heard. Pulmonary:     Effort: Pulmonary effort is normal.     Breath sounds: Normal breath sounds. No stridor. No wheezing, rhonchi or rales.     Comments: Clear to auscultation bilaterally Abdominal:     General: Bowel sounds are increased.     Palpations: Abdomen is soft.     Tenderness: There is abdominal tenderness in the right upper quadrant, epigastric area and left upper quadrant. There is no right CVA tenderness, left CVA tenderness, guarding or rebound. Negative signs include Murphy's sign.     Comments: Mild tenderness palpation throughout upper abdomen.  Negative Murphy sign.  Neurological:     Mental Status: He is alert.  Psychiatric:        Behavior: Behavior is cooperative.      UC Treatments / Results  Labs (all labs ordered are listed, but only abnormal results are displayed) Labs Reviewed  POCT URINE DIPSTICK - Abnormal; Notable for the following components:      Result Value   Ketones, POC UA trace (5) (*)    All other components within normal  limits  CBC WITH DIFFERENTIAL/PLATELET  COMPREHENSIVE METABOLIC PANEL WITH GFR  LIPASE, BLOOD    EKG   Radiology No results found.  Procedures Procedures (including critical care time)  Medications Ordered in  UC Medications  ondansetron  (ZOFRAN -ODT) disintegrating tablet 4 mg (4 mg Oral Given 02/16/24 1055)  alum & mag hydroxide-simeth (MAALOX/MYLANTA) 200-200-20 MG/5ML suspension 30 mL (30 mLs Oral Given 02/16/24 1120)    Initial Impression / Assessment and Plan / UC Course  I have reviewed the triage vital signs and the nursing notes.  Pertinent labs & imaging results that were available during my care of the patient were reviewed by me and considered in my medical decision making (see chart for details).     Patient is well-appearing, afebrile, nontoxic, nontachycardic.  Vital signs and physical exam are reassuring with no indication for emergent evaluation or imaging.  Patient was given Maalox as well as Zofran  in clinic with significant improvement of symptoms.  Suspect gastroenteritis as etiology of symptoms.  Basic blood work was obtained including CBC, CMP, lipase given upper abdominal discomfort with associated vomiting and diarrhea.  We will contact him if this is abnormal and changes our treatment plan.  He was encouraged to avoid NSAIDs and alcohol.  Recommended to eat a bland diet and avoid spicy/acidic/fatty foods while pushing fluids.  Prescription for Zofran  and dicyclomine  were sent to pharmacy to help manage his symptoms.  We discussed that if he is not feeling better within a few days he is to return for reevaluation.  If he has any worsening symptoms including persistent emesis despite antiemetic medication, severe abdominal pain, focal abdominal pain, fever, melena, hematochezia, weakness he needs to be seen in the emergency room.  Strict return precautions given.  Excuse note provided.  All questions answered to patient satisfaction.  Final Clinical Impressions(s)  / UC Diagnoses   Final diagnoses:  Nausea vomiting and diarrhea  Gastroenteritis  Abdominal cramping     Discharge Instructions      I am glad that you are feeling better with the medication.  I will contact you if any of your blood work is abnormal.  Use Zofran  every 8 hours as needed for nausea and vomiting symptoms.  Eat a bland diet and avoid spicy/acidic/fatty foods.  I also recommend that you avoid NSAIDs (aspirin, ibuprofen /Advil , naproxen /Aleve ) and alcohol.  Make sure that you are drinking plenty of fluid.  I have also called in dicyclomine  to help with the abdominal cramping.  If you are not feeling better within 1 to 2 days please return for reevaluation.  If anything worsens you have severe abdominal pain, pain in 1 spot in your abdomen, nausea/vomiting despite the medication, blood in your vomit, blood in your stool, weakness, fever you need to go to the emergency room immediately.     ED Prescriptions     Medication Sig Dispense Auth. Provider   ondansetron  (ZOFRAN -ODT) 4 MG disintegrating tablet Take 1 tablet (4 mg total) by mouth every 8 (eight) hours as needed for nausea or vomiting. 20 tablet Rosalene Wardrop K, PA-C   dicyclomine  (BENTYL ) 20 MG tablet Take 1 tablet (20 mg total) by mouth 2 (two) times daily. 20 tablet Zeola Brys K, PA-C      PDMP not reviewed this encounter.   Sherrell Rocky POUR, PA-C 02/16/24 1145

## 2024-02-16 NOTE — Discharge Instructions (Addendum)
 I am glad that you are feeling better with the medication.  I will contact you if any of your blood work is abnormal.  Use Zofran  every 8 hours as needed for nausea and vomiting symptoms.  Eat a bland diet and avoid spicy/acidic/fatty foods.  I also recommend that you avoid NSAIDs (aspirin, ibuprofen /Advil , naproxen /Aleve ) and alcohol.  Make sure that you are drinking plenty of fluid.  I have also called in dicyclomine  to help with the abdominal cramping.  If you are not feeling better within 1 to 2 days please return for reevaluation.  If anything worsens you have severe abdominal pain, pain in 1 spot in your abdomen, nausea/vomiting despite the medication, blood in your vomit, blood in your stool, weakness, fever you need to go to the emergency room immediately.

## 2024-03-26 ENCOUNTER — Ambulatory Visit (HOSPITAL_COMMUNITY)
Admission: RE | Admit: 2024-03-26 | Discharge: 2024-03-26 | Disposition: A | Discharge status: Home / Self Care | Source: Ambulatory Visit | Attending: Emergency Medicine | Admitting: Emergency Medicine

## 2024-03-26 ENCOUNTER — Encounter (HOSPITAL_COMMUNITY): Payer: Self-pay

## 2024-03-26 VITALS — BP 114/64 | HR 72 | Temp 97.7°F | Resp 16

## 2024-03-26 DIAGNOSIS — R051 Acute cough: Secondary | ICD-10-CM | POA: Diagnosis not present

## 2024-03-26 LAB — POCT INFLUENZA A/B
Influenza A, POC: NEGATIVE
Influenza B, POC: NEGATIVE

## 2024-03-26 LAB — POC SOFIA SARS ANTIGEN FIA: SARS Coronavirus 2 Ag: NEGATIVE

## 2024-03-26 MED ORDER — ONDANSETRON 4 MG PO TBDP: 4.0000 mg | ORAL_TABLET | Freq: Three times a day (TID) | ORAL | 0 refills | Status: AC | PRN

## 2024-03-26 MED ORDER — AZELASTINE HCL 0.1 % NA SOLN: 2.0000 | Freq: Two times a day (BID) | NASAL | 0 refills | Status: AC

## 2024-03-26 MED ORDER — PROMETHAZINE-DM 6.25-15 MG/5ML PO SYRP: 5.0000 mL | ORAL_SOLUTION | Freq: Every evening | ORAL | 0 refills | Status: DC | PRN

## 2024-03-26 NOTE — Discharge Instructions (Signed)
 Your COVID and flu testing were both negative today.  As discussed I believe your symptoms are likely related to a viral illness. You can use azelastine  nasal spray twice daily to help with nasal congestion. You can take Promethazine  DM cough syrup at bedtime as needed for cough.  This can make you drowsy so do not drive, work, or drink alcohol while taking this. You can take Zofran  every 8 hours as needed for nausea and vomiting.  This will dissolve under your tongue. Also recommend over-the-counter Mucinex  for cough and congestion. Make sure you are staying hydrated and getting plenty of rest. Follow-up with your primary care provider or return here as needed.

## 2024-03-26 NOTE — ED Provider Notes (Signed)
 " [facility]    CSN: [id number] Arrival date & time: 03/26/24  1831      History   Chief Complaint Chief Complaint  Patient presents with   Cough   Abdominal Pain   Diarrhea   Nausea   Nasal Congestion   Generalized Body Aches    HPI Manuel Silva is a 39 y.o. male.   Patient presents with nasal congestion, productive cough, body aches, nausea, and diarrhea that began 2 days ago.  Patient reports that his children have similar symptoms.  Patient denies any known fever.  Patient denies chest pain, shortness of breath, vomiting, and abdominal pain.  Patient reports he has been taking Mucinex , DayQuil, and Tylenol  with some relief.  Patient states that his cough is worse at night and does keep him up at night.  The history is provided by the patient and medical records.  Cough Abdominal Pain Associated symptoms: cough and diarrhea   Diarrhea Associated symptoms: abdominal pain     Past Medical History:  Diagnosis Date   Back pain    Gastritis     There are no active problems to display for this patient.   History reviewed. No pertinent surgical history.     Home Medications    Prior to Admission medications  Medication Sig Start Date End Date Taking? Authorizing Provider  azelastine  (ASTELIN ) 0.1 % nasal spray Place 2 sprays into both nostrils 2 (two) times daily. Use in each nostril as directed 03/26/24  Yes Johnie Flaming A, NP  ondansetron  (ZOFRAN -ODT) 4 MG disintegrating tablet Take 1 tablet (4 mg total) by mouth every 8 (eight) hours as needed for nausea or vomiting. 03/26/24  Yes Johnie, Vaishali Baise A, NP  promethazine -dextromethorphan  (PROMETHAZINE -DM) 6.25-15 MG/5ML syrup Take 5 mLs by mouth at bedtime as needed for cough. 03/26/24  Yes Johnie, Sumedh Shinsato A, NP  acetaminophen  (TYLENOL ) 500 MG tablet Take 1 tablet (500 mg total) by mouth every 6 (six) hours as needed. 10/16/23   Johnie Flaming A, NP  cyclobenzaprine  (FLEXERIL ) 5 MG tablet Take 1  tablet (5 mg total) by mouth 3 (three) times daily as needed for muscle spasms. Patient not taking: Reported on 03/26/2024 10/21/23   Richad Jon HERO, NP  dicyclomine  (BENTYL ) 20 MG tablet Take 1 tablet (20 mg total) by mouth 2 (two) times daily. Patient not taking: Reported on 03/26/2024 02/16/24   Raspet, Erin K, PA-C  ibuprofen  (ADVIL ) 800 MG tablet Take 1 tablet (800 mg total) by mouth 3 (three) times daily. Patient not taking: Reported on 03/26/2024 10/21/23   Richad Jon HERO, NP  lidocaine  (LIDODERM ) 5 % Place 1 patch onto the skin daily. Remove & Discard patch within 12 hours or as directed by MD Patient not taking: Reported on 03/26/2024 10/16/23   Johnie Flaming DELENA, NP    Family History Family History  Problem Relation Age of Onset   Healthy Mother    Healthy Father     Social History Social History[1]   Allergies   Penicillins   Review of Systems Review of Systems  Respiratory:  Positive for cough.   Gastrointestinal:  Positive for abdominal pain and diarrhea.   Per HPI  Physical Exam Triage Vital Signs ED Triage Vitals  Encounter Vitals Group     BP 03/26/24 1846 114/64     Girls Systolic BP Percentile --      Girls Diastolic BP Percentile --      Boys Systolic BP Percentile --  Boys Diastolic BP Percentile --      Pulse Rate 03/26/24 1846 72     Resp 03/26/24 1846 16     Temp 03/26/24 1846 97.7 F (36.5 C)     Temp Source 03/26/24 1846 Oral     SpO2 03/26/24 1846 94 %     Weight --      Height --      Head Circumference --      Peak Flow --      Pain Score 03/26/24 1845 7     Pain Loc --      Pain Education --      Exclude from Growth Chart --    No data found.  Updated Vital Signs BP 114/64 (BP Location: Left Arm)   Pulse 72   Temp 97.7 F (36.5 C) (Oral)   Resp 16   SpO2 94%   Visual Acuity Right Eye Distance:   Left Eye Distance:   Bilateral Distance:    Right Eye Near:   Left Eye Near:    Bilateral Near:     Physical Exam Vitals and  nursing note reviewed.  Constitutional:      General: He is awake. He is not in acute distress.    Appearance: Normal appearance. He is well-developed and well-groomed. He is not ill-appearing.  HENT:     Right Ear: Tympanic membrane, ear canal and external ear normal.     Left Ear: Tympanic membrane, ear canal and external ear normal.     Nose: Congestion and rhinorrhea present.     Mouth/Throat:     Mouth: Mucous membranes are moist.     Pharynx: Posterior oropharyngeal erythema and postnasal drip present. No oropharyngeal exudate.  Cardiovascular:     Rate and Rhythm: Normal rate and regular rhythm.  Pulmonary:     Effort: Pulmonary effort is normal.     Breath sounds: Normal breath sounds.  Abdominal:     General: Abdomen is flat. Bowel sounds are normal. There is no distension.     Palpations: Abdomen is soft. There is no mass.     Tenderness: There is no abdominal tenderness. There is no guarding or rebound.     Hernia: No hernia is present.  Skin:    General: Skin is warm and dry.  Neurological:     General: No focal deficit present.     Mental Status: He is alert and oriented to person, place, and time. Mental status is at baseline.  Psychiatric:        Behavior: Behavior is cooperative.      UC Treatments / Results  Labs (all labs ordered are listed, but only abnormal results are displayed) Labs Reviewed  POCT INFLUENZA A/B  POC SOFIA SARS ANTIGEN FIA    EKG   Radiology No results found.  Procedures Procedures (including critical care time)  Medications Ordered in UC Medications - No data to display  Initial Impression / Assessment and Plan / UC Course  I have reviewed the triage vital signs and the nursing notes.  Pertinent labs & imaging results that were available during my care of the patient were reviewed by me and considered in my medical decision making (see chart for details).     Patient is overall well-appearing.  Vitals are stable.  COVID  and flu testing negative.  Symptoms likely viral in nature.  Prescribed azelastine  nasal spray for congestion.  Prescribe Promethazine  DM for cough at bedtime.  Prescribe Zofran  as needed  for nausea and vomiting.  Discussed over-the-counter medications as needed for symptoms.  Discussed follow-up and return precautions. Final Clinical Impressions(s) / UC Diagnoses   Final diagnoses:  Acute cough     Discharge Instructions      Your COVID and flu testing were both negative today.  As discussed I believe your symptoms are likely related to a viral illness. You can use azelastine  nasal spray twice daily to help with nasal congestion. You can take Promethazine  DM cough syrup at bedtime as needed for cough.  This can make you drowsy so do not drive, work, or drink alcohol while taking this. You can take Zofran  every 8 hours as needed for nausea and vomiting.  This will dissolve under your tongue. Also recommend over-the-counter Mucinex  for cough and congestion. Make sure you are staying hydrated and getting plenty of rest. Follow-up with your primary care provider or return here as needed.     ED Prescriptions     Medication Sig Dispense Auth. Provider   azelastine  (ASTELIN ) 0.1 % nasal spray Place 2 sprays into both nostrils 2 (two) times daily. Use in each nostril as directed 30 mL Johnie Flaming A, NP   ondansetron  (ZOFRAN -ODT) 4 MG disintegrating tablet Take 1 tablet (4 mg total) by mouth every 8 (eight) hours as needed for nausea or vomiting. 10 tablet Johnie Flaming A, NP   promethazine -dextromethorphan  (PROMETHAZINE -DM) 6.25-15 MG/5ML syrup Take 5 mLs by mouth at bedtime as needed for cough. 118 mL Johnie Flaming A, NP      PDMP not reviewed this encounter.    [1]  Social History Tobacco Use   Smoking status: Former    Types: Cigarettes   Smokeless tobacco: Never  Vaping Use   Vaping status: Some Days  Substance Use Topics   Alcohol use: Not Currently    Comment:  occasionally   Drug use: No     Johnie Flaming LABOR, NP 03/26/24 2007  "

## 2024-03-26 NOTE — ED Triage Notes (Signed)
 Patient  c/o abdominal pain, diarrhea, nausea, a productive cough with green sputum, nasal congestion, and body aches x 3 days.   Patient states he has been taking Mucinex , Dayquil, Tylenol  for his symptoms

## 2024-04-02 ENCOUNTER — Ambulatory Visit (HOSPITAL_COMMUNITY)
Admission: RE | Admit: 2024-04-02 | Discharge: 2024-04-02 | Disposition: A | Discharge status: Home / Self Care | Source: Ambulatory Visit | Attending: Family Medicine | Admitting: Family Medicine

## 2024-04-02 ENCOUNTER — Encounter (HOSPITAL_COMMUNITY): Payer: Self-pay

## 2024-04-02 VITALS — BP 127/72 | HR 65 | Temp 98.0°F | Resp 17

## 2024-04-02 DIAGNOSIS — J069 Acute upper respiratory infection, unspecified: Secondary | ICD-10-CM

## 2024-04-02 LAB — POC SOFIA SARS ANTIGEN FIA: SARS Coronavirus 2 Ag: NEGATIVE

## 2024-04-02 LAB — POCT INFLUENZA A/B
Influenza A, POC: NEGATIVE
Influenza B, POC: NEGATIVE

## 2024-04-02 MED ORDER — HYDROCODONE BIT-HOMATROP MBR 5-1.5 MG/5ML PO SOLN: 5.0000 mL | Freq: Four times a day (QID) | ORAL | 0 refills | Status: AC | PRN

## 2024-04-02 NOTE — ED Triage Notes (Signed)
 Patient states his son has RSV and his coworker had COVID. States his job is wanting everyone tested for COVID.   Patient having cough, congestion, and sneezing onset 2 days ago.   Patient tried dayquil, Nyquil, and Mucinex  with mild relief.

## 2024-04-02 NOTE — ED Provider Notes (Signed)
 " [facility]   [id number] 04/02/24 Arrival Time: 0902  ASSESSMENT & PLAN:  1. Viral URI with cough    Work note provided. Discussed typical duration of likely viral illness. Results for orders placed or performed during the hospital encounter of 04/02/24  POC Influenza A/B   Collection Time: 04/02/24  9:37 AM  Result Value Ref Range   Influenza A, POC Negative Negative   Influenza B, POC Negative Negative  POC SARS Coronavirus Ag   Collection Time: 04/02/24  9:37 AM  Result Value Ref Range   SARS Coronavirus 2 Ag Negative Negative   OTC symptom care as needed.  Meds ordered this encounter  Medications   HYDROcodone  bit-homatropine (HYCODAN) 5-1.5 MG/5ML syrup    Sig: Take 5 mLs by mouth every 6 (six) hours as needed for cough.    Dispense:  90 mL    Refill:  0     Follow-up Information     [facility].   Specialty: Urgent Care Why: As needed. Contact information: [address] [place]  [id number] [phone]                Reviewed expectations re: course of current medical issues. Questions answered. Outlined signs and symptoms indicating need for more acute intervention. Understanding verbalized. After Visit Summary given.   SUBJECTIVE: History from: Patient. Manuel Silva is a 39 y.o. male. Patient states his son has RSV and his coworker had COVID. States his job is wanting everyone tested for COVID.   Patient having cough, congestion, and sneezing onset 2 days ago.   Patient tried dayquil, Nyquil, and Mucinex  with mild relief.  Denies: fever and difficulty breathing. Normal PO intake without n/v/d.  OBJECTIVE:  Vitals:   04/02/24 0917  BP: 127/72  Pulse: 65  Resp: 17  Temp: 98 F (36.7 C)  TempSrc: Oral  SpO2: 96%    General appearance: alert; no distress Eyes: PERRLA; EOMI; conjunctiva normal HENT: [place]; AT; with nasal congestion Neck: supple  Lungs: speaks full  sentences without difficulty; unlabored; clear; coughing Extremities: no edema Skin: warm and dry Neurologic: normal gait Psychological: alert and cooperative; normal mood and affect  Labs: Results for orders placed or performed during the hospital encounter of 04/02/24  POC Influenza A/B   Collection Time: 04/02/24  9:37 AM  Result Value Ref Range   Influenza A, POC Negative Negative   Influenza B, POC Negative Negative  POC SARS Coronavirus Ag   Collection Time: 04/02/24  9:37 AM  Result Value Ref Range   SARS Coronavirus 2 Ag Negative Negative   Labs Reviewed  POCT INFLUENZA A/B  POC SOFIA SARS ANTIGEN FIA    Imaging: No results found.  Allergies[1]  Past Medical History:  Diagnosis Date   Back pain    Gastritis    Social History   Socioeconomic History   Marital status: Single    Spouse name: Not on file   Number of children: Not on file   Years of education: Not on file   Highest education level: Not on file  Occupational History   Not on file  Tobacco Use   Smoking status: Former    Types: Cigarettes   Smokeless tobacco: Never  Vaping Use   Vaping status: Some Days  Substance and Sexual Activity   Alcohol use: Not Currently    Comment: occasionally   Drug use: No   Sexual activity: Yes  Other Topics Concern  Not on file  Social History Narrative   Not on file   Social Drivers of Health   Tobacco Use: Medium Risk (04/02/2024)   Patient History    Smoking Tobacco Use: Former    Smokeless Tobacco Use: Never    Passive Exposure: Not on [place] Strain: Not on file  Food Insecurity: Not on file  Transportation Needs: Not on file  Physical Activity: Not on file  Stress: Not on file  Social Connections: Not on file  Intimate Partner Violence: Not on file  Depression (EYV7-0): Not on file  Alcohol Screen: Not on file  Housing: Not on file  Utilities: Not on file  Health Literacy: Not on file   Family History  Problem  Relation Age of Onset   Healthy Mother    Healthy Father    History reviewed. No pertinent surgical history.    [1]  Allergies Allergen Reactions   Penicillins Other (See Comments)    Unknown- was told he was allergic to PCN Has patient had a PCN reaction causing immediate rash, facial/tongue/throat swelling, SOB or lightheadedness with hypotension: unk Has patient had a PCN reaction causing severe rash involving mucus membranes or skin necrosis: unk Has patient had a PCN reaction that required hospitalization: unk Has patient had a PCN reaction occurring within the last 10 years: unk If all of the above answers are NO, then may proceed with Cephalosporin use.       Rolinda Rogue, MD 04/02/24 [phone]  "

## 2024-04-02 NOTE — Discharge Instructions (Signed)
 Results for orders placed or performed during the hospital encounter of 04/02/24  POC Influenza A/B   Collection Time: 04/02/24  9:37 AM  Result Value Ref Range   Influenza A, POC Negative Negative   Influenza B, POC Negative Negative  POC SARS Coronavirus Ag   Collection Time: 04/02/24  9:37 AM  Result Value Ref Range   SARS Coronavirus 2 Ag Negative Negative
